# Patient Record
Sex: Female | Born: 1940
Health system: Southern US, Community
[De-identification: ages and names within clinical notes are randomized; demographics above are authoritative.]

## PROBLEM LIST (undated history)

## (undated) DIAGNOSIS — J189 Pneumonia, unspecified organism: Secondary | ICD-10-CM

## (undated) DIAGNOSIS — I499 Cardiac arrhythmia, unspecified: Secondary | ICD-10-CM

## (undated) DIAGNOSIS — I10 Essential (primary) hypertension: Secondary | ICD-10-CM

## (undated) DIAGNOSIS — M1990 Unspecified osteoarthritis, unspecified site: Secondary | ICD-10-CM

## (undated) DIAGNOSIS — E7800 Pure hypercholesterolemia, unspecified: Secondary | ICD-10-CM

## (undated) DIAGNOSIS — K219 Gastro-esophageal reflux disease without esophagitis: Secondary | ICD-10-CM

## (undated) DIAGNOSIS — C801 Malignant (primary) neoplasm, unspecified: Secondary | ICD-10-CM

## (undated) HISTORY — PX: CHOLECYSTECTOMY: SHX55

## (undated) HISTORY — PX: EYE SURGERY: SHX253

## (undated) HISTORY — PX: CARDIAC CATHETERIZATION: SHX172

## (undated) HISTORY — PX: TUBAL LIGATION: SHX77

## (undated) HISTORY — PX: DILATION AND CURETTAGE OF UTERUS: SHX78

---

## 2009-10-25 ENCOUNTER — Encounter: Admission: RE | Admit: 2009-10-25 | Discharge: 2009-10-25 | Payer: Self-pay | Admitting: Internal Medicine

## 2009-12-21 ENCOUNTER — Encounter (INDEPENDENT_AMBULATORY_CARE_PROVIDER_SITE_OTHER): Payer: Self-pay | Admitting: *Deleted

## 2010-01-26 ENCOUNTER — Encounter (INDEPENDENT_AMBULATORY_CARE_PROVIDER_SITE_OTHER): Payer: Self-pay | Admitting: *Deleted

## 2010-01-27 ENCOUNTER — Ambulatory Visit: Payer: Self-pay | Admitting: Internal Medicine

## 2010-02-07 ENCOUNTER — Ambulatory Visit: Payer: Self-pay | Admitting: Internal Medicine

## 2010-02-08 ENCOUNTER — Encounter: Payer: Self-pay | Admitting: Internal Medicine

## 2010-12-12 NOTE — Procedures (Signed)
Summary: Colonoscopy  Patient: Traci [name] Note: All result statuses are Final unless otherwise noted.  Tests: (1) Colonoscopy (COL)   COL Colonoscopy           DONE      [facility]     [address]     [place], [place]  [address]           COLONOSCOPY PROCEDURE REPORT           PATIENT:  Traci, [name]  MR#:  [id number]     BIRTHDATE:  1941/04/29, 68 yrs. old  GENDER:  female     ENDOSCOPIST:  Wilhemina Bonito. Eda Keys, MD     REF. BY:  Geoffry Paradise, M.D.     PROCEDURE DATE:  02/07/2010     PROCEDURE:  Colonoscopy with snare polypectomy x 5     ASA CLASS:  Class I     INDICATIONS:  Routine Risk Screening     MEDICATIONS:   Fentanyl 50 mcg IV, Versed 5 mg IV           DESCRIPTION OF PROCEDURE:   After the risks benefits and     alternatives of the procedure were thoroughly explained, informed     consent was obtained.  Digital rectal exam was performed and     revealed no abnormalities.   The LB CF-H180AL [id number] endoscope     was introduced through the anus and advanced to the cecum, which     was identified by both the appendix and ileocecal valve, without     limitations.Time to cecum = 1:51 min.  The quality of the prep was     excellent, using MoviPrep.  The instrument was then slowly     withdrawn (time = 17:01 min )as the colon was fully examined.     <<PROCEDUREIMAGES>>           FINDINGS:  Five polyps were found: 43mm,3mm,4mm in the ascending     colon and 26mm,5mm in the transverse colon. Polyps were snared     without cautery. Retrieval was successful.   Moderate     diverticulosis was found in the left colon.   Retroflexed views in     the rectum revealed internal hemorrhoids.    The scope was then     withdrawn from the patient and the procedure completed.           COMPLICATIONS:  None     ENDOSCOPIC IMPRESSION:     1) Five polyps - removed     2) Moderate diverticulosis in the left colon     3) Internal hemorrhoids           RECOMMENDATIONS:     1)  Follow up colonoscopy in 3 years           ______________________________     Wilhemina Bonito. Eda Keys, MD           CC:  Geoffry Paradise, MD;The Patient           n.     Rosalie DoctorWilhemina Bonito. Eda Keys at 02/07/2010 08:50 AM           Barnie Alderman, [id number]  Note: An exclamation mark (!) indicates a result that was not dispersed into the flowsheet. Document Creation Date: 02/07/2010 8:51 AM _______________________________________________________________________  (1) Order result status: Final Collection or observation date-time: 02/07/2010 08:44 Requested date-time:  Receipt date-time:  Reported date-time:  Referring Physician:   Ordering Physician:  Fransico Setters [phone]) Specimen Source:  Source: Launa Grill Order Number: [id number] Lab site:   Appended Document: Colonoscopy recall 3 yrs/01-2013     Procedures Next Due Date:    Colonoscopy: 02/2013

## 2010-12-12 NOTE — Letter (Signed)
Summary: [facility] Instructions  [facility]  [address] [place], [place] [address]   Phone: [phone]  Fax: [phone]       Traci [name]    [birth year]    MRN: [id number]        Procedure Day /Date:  Tuesday 02/07/2010     Arrival Time: 7:30 am      Procedure Time: 8:00 am     Location of Procedure:                    _ x_  [facility] (4th Floor)                        PREPARATION FOR COLONOSCOPY WITH MOVIPREP   Starting 5 days prior to your procedure Thursday 3/24 do not eat nuts, seeds, popcorn, corn, beans, peas,  salads, or any raw vegetables.  Do not take any fiber supplements (e.g. Metamucil, Citrucel, and Benefiber).  THE DAY BEFORE YOUR PROCEDURE         DATE: Monday 3/28  1.  Drink clear liquids the entire day-NO SOLID FOOD  2.  Do not drink anything colored red or purple.  Avoid juices with pulp.  No orange juice.  3.  Drink at least 64 oz. (8 glasses) of fluid/clear liquids during the day to prevent dehydration and help the prep work efficiently.  CLEAR LIQUIDS INCLUDE: Water Jello Ice Popsicles Tea (sugar ok, no milk/cream) Powdered fruit flavored drinks Coffee (sugar ok, no milk/cream) Gatorade Juice: apple, white grape, white cranberry  Lemonade Clear bullion, consomm, broth Carbonated beverages (any kind) Strained chicken noodle soup Hard Candy                             4.  In the morning, mix first dose of MoviPrep solution:    Empty 1 Pouch A and 1 Pouch B into the disposable container    Add lukewarm drinking water to the top line of the container. Mix to dissolve    Refrigerate (mixed solution should be used within 24 hrs)  5.  Begin drinking the prep at 5:00 p.m. The MoviPrep container is divided by 4 marks.   Every 15 minutes drink the solution down to the next mark (approximately 8 oz) until the full liter is complete.   6.  Follow completed prep with 16 oz of clear liquid of your choice (Nothing  red or purple).  Continue to drink clear liquids until bedtime.  7.  Before going to bed, mix second dose of MoviPrep solution:    Empty 1 Pouch A and 1 Pouch B into the disposable container    Add lukewarm drinking water to the top line of the container. Mix to dissolve    Refrigerate  THE DAY OF YOUR PROCEDURE      DATE: Tuesday 3/29  Beginning at 3:00 am (5 hours before procedure):         1. Every 15 minutes, drink the solution down to the next mark (approx 8 oz) until the full liter is complete.  2. Follow completed prep with 16 oz. of clear liquid of your choice.    3. You may drink clear liquids until 6:00 am  (2 HOURS BEFORE PROCEDURE).   MEDICATION INSTRUCTIONS  Unless otherwise instructed, you should take regular prescription medications with a small sip of water   as early as possible the morning  of your procedure.          OTHER INSTRUCTIONS  You will need a responsible adult at least 71 years of age to accompany you and drive you home.   This person must remain in the waiting room during your procedure.  Wear loose fitting clothing that is easily removed.  Leave jewelry and other valuables at home.  However, you may wish to bring a book to read or  an iPod/MP3 player to listen to music as you wait for your procedure to start.  Remove all body piercing jewelry and leave at home.  Total time from sign-in until discharge is approximately 2-3 hours.  You should go home directly after your procedure and rest.  You can resume normal activities the  day after your procedure.  The day of your procedure you should not:   Drive   Make legal decisions   Operate machinery   Drink alcohol   Return to work  You will receive specific instructions about eating, activities and medications before you leave.    The above instructions have been reviewed and explained to me by   Ezra Sites RN  January 27, 2010 10:15 AM    I fully understand and can verbalize  these instructions _____________________________ Date _________

## 2010-12-12 NOTE — Miscellaneous (Signed)
Summary: [facility]  Clinical Lists Changes  Medications: Added new medication of MOVIPREP 100 GM  SOLR (PEG-KCL-NACL-NASULF-NA ASC-C) As per prep instructions. - Signed Rx of MOVIPREP 100 GM  SOLR (PEG-KCL-NACL-NASULF-NA ASC-C) As per prep instructions.;  #1 x 0;  Signed;  Entered by: Ezra Sites RN;  Authorized by: Hilarie Fredrickson MD;  Method used: Electronically to [facility] [id number] [address] [place], [place], [place]  [id number], Ph: [phone], Fax: [phone] Observations: Added new observation of NKA: T (01/27/2010 9:53)    Prescriptions: MOVIPREP 100 GM  SOLR (PEG-KCL-NACL-NASULF-NA ASC-C) As per prep instructions.  #1 x 0   Entered by:   Ezra Sites RN   Authorized by:   Hilarie Fredrickson MD   Signed by:   Ezra Sites RN on 01/27/2010   Method used:   Electronically to        [facility] (retail)       [phone] [address]       [place], [place]  [id number]       Ph: [phone]       Fax: [phone]   RxID:   [id number]

## 2010-12-12 NOTE — Letter (Signed)
Summary: Previsit letter  [facility]  [address] [place], [place] [address]   Phone: [phone]  Fax: [phone]       12/21/2009 MRN: [id number]  Traci [name] [address] [place], [place]  [address]  Dear [name],  Welcome to the Gastroenterology Division at [facility].    You are scheduled to see a nurse for your pre-procedure visit on 01-27-10 at 10:00a.m. on the 3rd floor at [facility], [address]  We ask that you try to arrive at our office 15 minutes prior to your appointment time to allow for check-in.  Your nurse visit will consist of discussing your medical and surgical history, your immediate family medical history, and your medications.    Please bring a complete list of all your medications or, if you prefer, bring the medication bottles and we will list them.  We will need to be aware of both prescribed and over the counter drugs.  We will need to know exact dosage information as well.  If you are on blood thinners (Coumadin, Plavix, Aggrenox, Ticlid, etc.) please call our office today/prior to your appointment, as we need to consult with your physician about holding your medication.   Please be prepared to read and sign documents such as consent forms, a financial agreement, and acknowledgement forms.  If necessary, and with your consent, a friend or relative is welcome to sit-in on the nurse visit with you.  Please bring your insurance card so that we may make a copy of it.  If your insurance requires a referral to see a specialist, please bring your referral form from your primary care physician.  No co-pay is required for this nurse visit.     If you cannot keep your appointment, please call [phone] to cancel or reschedule prior to your appointment date.  This allows [place] the opportunity to schedule an appointment for another patient in need of care.    Thank you for choosing [facility] for your medical  needs.  We appreciate the opportunity to care for you.  Please visit [place] at our website  to learn more about our practice.                     Sincerely.                                                                                                                   The Gastroenterology Division

## 2010-12-12 NOTE — Letter (Signed)
Summary: Patient Notice- Polyp Results  [facility]  [address] [place], [place] [address]   Phone: [phone]  Fax: [phone]        February 08, 2010 MRN: [id number]    [facility] [place] [address] [place], [place]  [address]    Dear Ms. Eber,  I am pleased to inform you that the colon polyp(s) removed during your recent colonoscopy was (were) found to be benign (no cancer detected) upon pathologic examination.  I recommend you have a repeat colonoscopy examination in 3 years to look for recurrent polyps, as having colon polyps increases your risk for having recurrent polyps or even colon cancer in the future.  Should you develop new or worsening symptoms of abdominal pain, bowel habit changes or bleeding from the rectum or bowels, please schedule an evaluation with either your primary care physician or with me.  Additional information/recommendations:  __ No further action with gastroenterology is needed at this time. Please      follow-up with your primary care physician for your other healthcare      needs.    Please call us if you are having persistent problems or have questions about your condition that have not been fully answered at this time.  Sincerely,  Hilarie Fredrickson MD  This letter has been electronically signed by your physician.  Appended Document: Patient Notice- Polyp Results letter mailed 4.1.11

## 2011-02-09 ENCOUNTER — Inpatient Hospital Stay (HOSPITAL_COMMUNITY)
Admission: EM | Admit: 2011-02-09 | Discharge: 2011-02-16 | DRG: 419 | Disposition: A | Discharge status: Home / Self Care | Payer: Medicare Other | Source: Ambulatory Visit | Attending: Cardiology | Admitting: Cardiology

## 2011-02-09 ENCOUNTER — Emergency Department (HOSPITAL_COMMUNITY): Payer: Medicare Other

## 2011-02-09 DIAGNOSIS — I4891 Unspecified atrial fibrillation: Secondary | ICD-10-CM | POA: Diagnosis not present

## 2011-02-09 DIAGNOSIS — Z87891 Personal history of nicotine dependence: Secondary | ICD-10-CM

## 2011-02-09 DIAGNOSIS — R339 Retention of urine, unspecified: Secondary | ICD-10-CM | POA: Diagnosis not present

## 2011-02-09 DIAGNOSIS — K8000 Calculus of gallbladder with acute cholecystitis without obstruction: Principal | ICD-10-CM | POA: Diagnosis present

## 2011-02-09 DIAGNOSIS — Z7982 Long term (current) use of aspirin: Secondary | ICD-10-CM

## 2011-02-09 DIAGNOSIS — E785 Hyperlipidemia, unspecified: Secondary | ICD-10-CM | POA: Diagnosis present

## 2011-02-09 DIAGNOSIS — E876 Hypokalemia: Secondary | ICD-10-CM | POA: Diagnosis not present

## 2011-02-09 DIAGNOSIS — I10 Essential (primary) hypertension: Secondary | ICD-10-CM | POA: Diagnosis present

## 2011-02-09 DIAGNOSIS — K219 Gastro-esophageal reflux disease without esophagitis: Secondary | ICD-10-CM | POA: Diagnosis present

## 2011-02-09 DIAGNOSIS — I359 Nonrheumatic aortic valve disorder, unspecified: Secondary | ICD-10-CM | POA: Diagnosis present

## 2011-02-09 LAB — COMPREHENSIVE METABOLIC PANEL
ALT: 12 U/L (ref 0–35)
AST: 16 U/L (ref 0–37)
Alkaline Phosphatase: 54 U/L (ref 39–117)
CO2: 28 mEq/L (ref 19–32)
GFR calc non Af Amer: >60 mL/min (ref >60)
Glucose, Bld: 116 mg/dL — ABNORMAL HIGH (ref 70–99)
Potassium: 4.2 mEq/L (ref 3.5–5.1)
Sodium: 138 mEq/L (ref 135–145)
Total Protein: 7.3 g/dL (ref 6.0–8.3)

## 2011-02-09 LAB — DIFFERENTIAL
Lymphocytes Relative: 15 % (ref 12–46)
Lymphs Abs: 1.5 10*3/uL (ref 0.7–4.0)
Monocytes Absolute: 0.6 10*3/uL (ref 0.1–1.0)
Monocytes Relative: 6 % (ref 3–12)
Neutro Abs: 7.8 10*3/uL — ABNORMAL HIGH (ref 1.7–7.7)

## 2011-02-09 LAB — CBC
HCT: 36.9 % (ref 36.0–46.0)
Hemoglobin: 12.2 g/dL (ref 12.0–15.0)
MCH: 29.3 pg (ref 26.0–34.0)
MCHC: 33.1 g/dL (ref 30.0–36.0)
MCV: 88.5 fL (ref 78.0–100.0)

## 2011-02-10 ENCOUNTER — Emergency Department (HOSPITAL_COMMUNITY): Payer: Medicare Other

## 2011-02-10 ENCOUNTER — Observation Stay (HOSPITAL_COMMUNITY): Payer: Medicare Other

## 2011-02-10 LAB — CBC
HCT: 33.9 % — ABNORMAL LOW (ref 36.0–46.0)
Hemoglobin: 11.2 g/dL — ABNORMAL LOW (ref 12.0–15.0)
MCH: 29.6 pg (ref 26.0–34.0)
MCV: 89.4 fL (ref 78.0–100.0)
Platelets: 240 10*3/uL (ref 150–400)
RBC: 3.79 MIL/uL — ABNORMAL LOW (ref 3.87–5.11)
WBC: 7 10*3/uL (ref 4.0–10.5)

## 2011-02-10 LAB — LIPID PANEL
HDL: 73 mg/dL (ref >39)
Total CHOL/HDL Ratio: 2.1 RATIO
VLDL: 7 mg/dL (ref 0–40)

## 2011-02-10 LAB — CARDIAC PANEL(CRET KIN+CKTOT+MB+TROPI)
Relative Index: INVALID (ref 0.0–2.5)
Total CK: 51 U/L (ref 7–177)
Total CK: 44 U/L (ref 7–177)
Troponin I: 0.02 ng/mL (ref 0.00–0.06)

## 2011-02-10 LAB — POCT CARDIAC MARKERS
CKMB, poc: <1 ng/mL — ABNORMAL LOW (ref 1.0–8.0)
Myoglobin, poc: 48.5 ng/mL (ref 12–200)
Troponin i, poc: <0.05 ng/mL (ref 0.00–0.09)

## 2011-02-10 LAB — CK TOTAL AND CKMB (NOT AT ARMC): CK, MB: 0.5 ng/mL (ref 0.3–4.0)

## 2011-02-10 LAB — HEMOGLOBIN A1C: Mean Plasma Glucose: 114 mg/dL (ref <117)

## 2011-02-10 LAB — D-DIMER, QUANTITATIVE

## 2011-02-10 LAB — MAGNESIUM: Magnesium: 2.1 mg/dL (ref 1.5–2.5)

## 2011-02-10 LAB — TSH: TSH: 0.782 u[IU]/mL (ref 0.350–4.500)

## 2011-02-10 MED ORDER — IOHEXOL 300 MG/ML  SOLN
100.0000 mL | Freq: Once | INTRAMUSCULAR | Status: AC | PRN
  Administered 2011-02-10: 100 mL via INTRAVENOUS

## 2011-02-11 ENCOUNTER — Observation Stay (HOSPITAL_COMMUNITY): Payer: Medicare Other

## 2011-02-11 LAB — BASIC METABOLIC PANEL
BUN: 11 mg/dL (ref 6–23)
Calcium: 8.3 mg/dL — ABNORMAL LOW (ref 8.4–10.5)
Chloride: 104 mEq/L (ref 96–112)
Creatinine, Ser: 0.89 mg/dL (ref 0.4–1.2)
GFR calc Af Amer: >60 mL/min (ref >60)

## 2011-02-11 LAB — LIPID PANEL
Cholesterol: 135 mg/dL (ref 0–200)
Total CHOL/HDL Ratio: 2.3 RATIO

## 2011-02-11 LAB — COMPREHENSIVE METABOLIC PANEL
ALT: 10 U/L (ref 0–35)
AST: 21 U/L (ref 0–37)
Alkaline Phosphatase: 44 U/L (ref 39–117)
CO2: 27 mEq/L (ref 19–32)
Calcium: 8.4 mg/dL (ref 8.4–10.5)
GFR calc Af Amer: >60 mL/min (ref >60)
GFR calc non Af Amer: >60 mL/min (ref >60)
Potassium: 4.2 mEq/L (ref 3.5–5.1)
Sodium: 137 mEq/L (ref 135–145)
Total Protein: 6 g/dL (ref 6.0–8.3)

## 2011-02-11 LAB — CBC
MCH: 29.6 pg (ref 26.0–34.0)
MCHC: 32.7 g/dL (ref 30.0–36.0)
MCV: 90.6 fL (ref 78.0–100.0)
Platelets: 219 10*3/uL (ref 150–400)
RBC: 3.41 MIL/uL — ABNORMAL LOW (ref 3.87–5.11)
RDW: 13.9 % (ref 11.5–15.5)

## 2011-02-11 LAB — HEPARIN LEVEL (UNFRACTIONATED): Heparin Unfractionated: 1.04 IU/mL — ABNORMAL HIGH (ref 0.30–0.70)

## 2011-02-11 LAB — CK TOTAL AND CKMB (NOT AT ARMC): CK, MB: 0.2 ng/mL — ABNORMAL LOW (ref 0.3–4.0)

## 2011-02-11 LAB — LIPASE, BLOOD: Lipase: 25 U/L (ref 11–59)

## 2011-02-11 MED ORDER — IOHEXOL 300 MG/ML  SOLN
100.0000 mL | Freq: Once | INTRAMUSCULAR | Status: AC | PRN
  Administered 2011-02-11: 100 mL via INTRAVENOUS

## 2011-02-12 ENCOUNTER — Other Ambulatory Visit: Payer: Self-pay | Admitting: General Surgery

## 2011-02-12 LAB — COMPREHENSIVE METABOLIC PANEL
ALT: 14 U/L (ref 0–35)
AST: 18 U/L (ref 0–37)
Albumin: 3.4 g/dL — ABNORMAL LOW (ref 3.5–5.2)
Calcium: 8.3 mg/dL — ABNORMAL LOW (ref 8.4–10.5)
GFR calc Af Amer: >60 mL/min (ref >60)
Sodium: 133 mEq/L — ABNORMAL LOW (ref 135–145)
Total Protein: 6.7 g/dL (ref 6.0–8.3)

## 2011-02-12 LAB — CBC
Platelets: 271 10*3/uL (ref 150–400)
RDW: 13.4 % (ref 11.5–15.5)
WBC: 14.8 10*3/uL — ABNORMAL HIGH (ref 4.0–10.5)

## 2011-02-13 ENCOUNTER — Inpatient Hospital Stay (HOSPITAL_COMMUNITY): Payer: Medicare Other

## 2011-02-13 LAB — CBC
Hemoglobin: 8.8 g/dL — ABNORMAL LOW (ref 12.0–15.0)
MCHC: 32.2 g/dL (ref 30.0–36.0)
MCV: 90.9 fL (ref 78.0–100.0)
Platelets: 180 10*3/uL (ref 150–400)
RDW: 13.6 % (ref 11.5–15.5)
WBC: 10.3 10*3/uL (ref 4.0–10.5)

## 2011-02-13 LAB — URINE CULTURE
Colony Count: 30000
Culture  Setup Time: 201204012117

## 2011-02-13 NOTE — Op Note (Signed)
Traci [name], ROBBS                ACCOUNT NO.:  [id number]  MEDICAL RECORD NO.:  [id number]           PATIENT TYPE:  I  LOCATION:  5529                         FACILITY:  [facility]  PHYSICIAN:  Juanetta Gosling, MDDATE OF BIRTH:  1941/10/12  DATE OF PROCEDURE:  02/12/2011 DATE OF DISCHARGE:                              OPERATIVE REPORT   PREOPERATIVE DIAGNOSIS:  Acute cholecystitis.  POSTOPERATIVE DIAGNOSIS:  Acute cholecystitis.  PROCEDURE:  Laparoscopic cholecystectomy.  SURGEON:  Troy Sine. Dwain Sarna, MD  ASSISTANT:  None.  ANESTHESIA:  General.  DRAINS:  A 19-French Blake drain to gallbladder fossa.  SPECIMEN:  Gallbladder and contents to pathology.  ESTIMATED BLOOD LOSS:  50 mL.  COMPLICATIONS:  None.  DISPOSITION:  To recovery room in stable condition.  INDICATIONS:  This is a 70 year old female with hypertension, hyperlipidemia who was had several episodes of indigestion and epigastric right upper quadrant pain over the past several months.  She has undergone a cardiac evaluation.  This has been negative.  She underwent evaluation with ultrasound as well as a CT scan which showed what appeared to be cholecystitis.  On my exam, she had a Murphy sign and clearly had cholecystitis that went along with a radiologic studies. She and I discussed the laparoscopic cholecystectomy.  I waited until her heparin had worn off that she was off on until the morning when I saw her and then we took her to the operating room.  PROCEDURE:  After informed consent was obtained, the patient was taken to the operating room.  Sequential compression devices were placed on lower extremities prior to induction of anesthesia.  She was then placed on general anesthesia without complication.  Her abdomen was prepped and draped in standard sterile surgical fashion.  Surgical time-out was then performed.  A 0.25% Marcaine was infiltrated below her umbilicus.  I then made a vertical  incision.  This was carried out down to her fascia.  Her fascia was entered sharply.  Her peritoneum was entered bluntly.  There was some murky fluid present in her abdomen upon entering.  I then placed 3 further 5-mm trocars in the right upper quadrant and the epigastrium. Dr. Janee Morn did two in the right upper quadrant.  Her gallbladder was noted then to have acute cholecystitis where a couple portions were gangrenous.  The omentum was adherent to the gallbladder.  This was removed bluntly.  I then aspirated the gallbladder.  We were unable to grab it at this point.  We are able to be retract it cephalad and lateral with some difficulty.  Eventually, I was able to obtain the critical view of safety and clearly saw both the cystic duct and cystic artery.  I clipped these structures.  I clipped both the anterior and posterior branch of the cystic artery separately and then divided them. The gallbladder was then removed from the liver bed with some difficulty, although there was no spillage.  This was then placed in EndoCatch bag and removed from the umbilicus.  This was difficult as she had enlarged umbilical incision to get this out.  I then irrigated copiously.  After about 10 minutes, we obtained hemostasis.  I then placed a piece of Surgicel snow into the gallbladder bed.  I then placed a 19-French Blake drain as well.  This was secured with the 2-0 nylon. I then evacuated all of the fluid and removed my Hasson trocar.  I did place an additional 0 Vicryl stitch in the umbilical incision.  I had viewed the umbilical incision as well and I have made sure that there was not any entry injury.  I then close all the incision with a 4-0 Monocryl and Dermabond was placed over these.  She tolerated this well, was extubated in the operating room and transferred to recovery room in stable condition.     Juanetta Gosling, MD     MCW/MEDQ  D:  02/12/2011  T:  02/13/2011  Job:   [id number]  Electronically Signed by Emelia Loron MD on 02/13/2011 04:01:12 PM

## 2011-02-14 ENCOUNTER — Inpatient Hospital Stay (HOSPITAL_COMMUNITY): Payer: Medicare Other

## 2011-02-14 LAB — HEPATIC FUNCTION PANEL
Bilirubin, Direct: 0.2 mg/dL (ref 0.0–0.3)
Indirect Bilirubin: 0.3 mg/dL (ref 0.3–0.9)
Total Protein: 5 g/dL — ABNORMAL LOW (ref 6.0–8.3)

## 2011-02-14 LAB — PROCALCITONIN: Procalcitonin: 2.23 ng/mL

## 2011-02-14 LAB — BASIC METABOLIC PANEL
CO2: 27 mEq/L (ref 19–32)
CO2: 24 mEq/L (ref 19–32)
Calcium: 7.5 mg/dL — ABNORMAL LOW (ref 8.4–10.5)
Calcium: 7.4 mg/dL — ABNORMAL LOW (ref 8.4–10.5)
Chloride: 104 mEq/L (ref 96–112)
Creatinine, Ser: 0.94 mg/dL (ref 0.4–1.2)
GFR calc Af Amer: >60 mL/min (ref >60)
GFR calc Af Amer: >60 mL/min (ref >60)
GFR calc non Af Amer: 59 mL/min — ABNORMAL LOW (ref >60)
Glucose, Bld: 114 mg/dL — ABNORMAL HIGH (ref 70–99)
Potassium: 3 mEq/L — ABNORMAL LOW (ref 3.5–5.1)
Sodium: 136 mEq/L (ref 135–145)

## 2011-02-14 LAB — URINALYSIS, ROUTINE W REFLEX MICROSCOPIC
Glucose, UA: NEGATIVE mg/dL
Leukocytes, UA: NEGATIVE
Nitrite: NEGATIVE
Protein, ur: 100 mg/dL — AB
Urobilinogen, UA: 1 mg/dL (ref 0.0–1.0)

## 2011-02-14 LAB — URINE MICROSCOPIC-ADD ON

## 2011-02-14 LAB — MAGNESIUM
Magnesium: 1.9 mg/dL (ref 1.5–2.5)
Magnesium: 1.9 mg/dL (ref 1.5–2.5)

## 2011-02-14 LAB — CBC
Hemoglobin: 9.6 g/dL — ABNORMAL LOW (ref 12.0–15.0)
MCH: 29.1 pg (ref 26.0–34.0)
MCHC: 31.6 g/dL (ref 30.0–36.0)
RDW: 13.7 % (ref 11.5–15.5)

## 2011-02-14 LAB — CARDIAC PANEL(CRET KIN+CKTOT+MB+TROPI)
CK, MB: 1.2 ng/mL (ref 0.3–4.0)
Relative Index: 0.3 (ref 0.0–2.5)
Total CK: 359 U/L — ABNORMAL HIGH (ref 7–177)

## 2011-02-14 LAB — CK TOTAL AND CKMB (NOT AT ARMC)
CK, MB: 1.8 ng/mL (ref 0.3–4.0)
CK, MB: 1.4 ng/mL (ref 0.3–4.0)
Relative Index: 0.5 (ref 0.0–2.5)

## 2011-02-14 LAB — PHOSPHORUS
Phosphorus: 1.7 mg/dL — ABNORMAL LOW (ref 2.3–4.6)
Phosphorus: 3 mg/dL (ref 2.3–4.6)

## 2011-02-14 LAB — TROPONIN I: Troponin I: 0.08 ng/mL — ABNORMAL HIGH (ref 0.00–0.06)

## 2011-02-15 LAB — BASIC METABOLIC PANEL
BUN: 6 mg/dL (ref 6–23)
CO2: 26 mEq/L (ref 19–32)
Calcium: 7.6 mg/dL — ABNORMAL LOW (ref 8.4–10.5)
Chloride: 107 mEq/L (ref 96–112)
Creatinine, Ser: 0.77 mg/dL (ref 0.4–1.2)
GFR calc Af Amer: >60 mL/min (ref >60)

## 2011-02-15 LAB — CBC
Hemoglobin: 8.6 g/dL — ABNORMAL LOW (ref 12.0–15.0)
RBC: 2.93 MIL/uL — ABNORMAL LOW (ref 3.87–5.11)
WBC: 6.2 10*3/uL (ref 4.0–10.5)

## 2011-02-15 LAB — TROPONIN I: Troponin I: 0.05 ng/mL (ref 0.00–0.06)

## 2011-02-15 LAB — URINE CULTURE

## 2011-02-15 NOTE — H&P (Signed)
Traci [name], Traci [name]                ACCOUNT NO.:  [id number]  MEDICAL RECORD NO.:  [id number]          PATIENT TYPE:  INP  LOCATION:                               FACILITY:  [facility]  PHYSICIAN:  Landry Corporal, MD DATE OF BIRTH:  [birth year]  DATE OF ADMISSION:  02/10/2011 DATE OF DISCHARGE:                             HISTORY & PHYSICAL   CHIEF COMPLAINT:  Chest pain.  HISTORY OF PRESENT ILLNESS:  A 70 year old white female with cardiac workup on November 20, 2010.  At that time, she had had 2 episodes of unexplained chest pain at rest and she had had one prior to that episode.  When she has been awakened from sleep, with severe retrosternal pain persisted for 4 hours, partially relieved by sitting up, and vomiting x3, with severity of symptoms 6/10 on a 1-10 scale. She had also complained of prominent fatigue and she would have to stop and rest during her activity.  On his exam, he recommended a nuclear stress test, which she underwent and she was told that it was within normal limits.  He did note she had an aortic outflow murmur, which she felt was fairly unremarkable and an echocardiogram was ordered as well.  Unfortunately, I do not have access to either of those tonight.  He also recommended if both were negative, then upper endoscopy may be warranted.  Since that visit, the patient has had 3 more episodes of chest pain after meals.  Today's episode occurred 3 hours after a meal.  She had been sitting up.  She felt fine and then suddenly developed chest pressure, tightness, midsternal with radiation to her back and she did get clammy and vomited twice, but the pain continued despite vomiting. She came to the emergency room with pain.  The pain did last for about 5 hours.  EKG, I do not have an old one to compare of that sinus rhythm, first-degree AV block, PR interval was 240 msec, no acute changes.  Here in the ER, her D-dimer was elevated and she underwent CT angio  with no pulmonary embolus noted.  We were asked to see in consult with a ER physician.  PAST MEDICAL HISTORY:  Positive for hypertension and hyperlipidemia.  No major surgeries.  ALLERGIES:  No known allergies.  OUTPATIENT MEDICATIONS: 1. Simvastatin 40 mg daily. 2. Lisinopril/HCTZ 20/12.5 daily. 3. Vitamin D weekly. 4. Aspirin 325 that she rarely rarely takes.  SOCIAL HISTORY:  She has been widowed for over a year, retired Haematologist at [employer].  She has 3 children.  No grandchildren.  Her family is with her tonight.  She smokes very briefly and stopped over 4 years ago.  Occasional glass of wine and never used recreational drugs.  FAMILY HISTORY:  Her father had diabetes and died of heart disease at 31.  Mother lived to be 14 and had Alzheimer's.  She has a brother and sister that have diabetes as well.  Her children are in good health. She has 3 sons.  PHYSICAL EXAMINATION:  VITAL SIGNS:  Blood pressure 146/70 on arrival, now 120/61; pulse 93; respiratory rate 20;  temperature 98.4.  A rectal temperature was done, which was 98.8.  She had also complained of fever and chills.  Oxygen saturation 92% and then 96%. GENERAL:  Alert, oriented, white female, pleasant affect.  Alert and oriented x3.  Follows commands.  No acute distress. SKIN:  Warm and dry.  Brisk capillary refill. HEENT:  Normocephalic.  Sclerae clear. NECK:  Supple.  No JVD.  No bruits.  No thyromegaly. LUNGS:  Clear without rales, rhonchi, or wheezes. HEART:  Sounds S1-S2.  Regular rate and rhythm with a 3/6 aortic outflow murmur. ABDOMEN:  Soft, mild discomfort in the right upper quadrant, more so when she releases her breast under her right rib cage.  No palpable masses. LOWER EXTREMITIES:  Without edema, 2+ pedal pulses bilaterally, 2+ radials bilaterally. NEURO:  Alert and oriented x3.  Follows commands.  ASSESSMENT: 1. Recurrent chest pain associated with nausea and vomiting and     clamminess,  this is while sitting up and 3 hours after meals with     recent history of negative stress test per the patient. 2. Aortic outflow murmur. 3. Hypertension, controlled. 4. Hyperlipidemia with lipid panel in November 2011, cholesterol 206,     triglycerides 98, HDL 75, and LDL of 111.  PLAN:  Admit observation.  We will do serial CK-MBs.  We will give injection of Lovenox tonight 1 mg/kg andcontinue current outpatient meds.  We will get a gallbladder ultrasound later this morning to rule out  gallbladder disease as the cause of intermittent pain with nausea  and vomiting as long as cardiac enzymes were negative.   Cardiologist will also see later this morning to determine if cardiac cath should be pursued as well.  Please note other labs, lipase was 48, myoglobin 49.9, CK-MB less than 1.0, troponin-I less than 0.05, D-dimer as stated was 0.68 with negative CT of the chest.  Hemoglobin 12.2, hematocrit 36.9, WBC 9.9, platelets 252.  Sodium 138, potassium 4.2, chloride 101, CO2 28, glucose 116, BUN 20, creatinine 0.86, calcium 9.5, total protein 7.3, albumin 3.9, SGOT 16, SGPT 12.     [name]. [name], N.P.   ______________________________ Landry Corporal, MD    LRI/MEDQ  D:  02/10/2011  T:  02/10/2011  Job:  [id number]  cc:   Thurmon Fair, MD Geoffry Paradise, MD  Electronically Signed by Nada Boozer N.P. on 02/14/2011 02:56:19 PM Electronically Signed by Bryan Lemma MD on 02/15/2011 07:10:08 AM MedRecNo: [id number] [facility], Account: [id number], DocSeq: [id number] The patient was admitted while I as on call.  However, I did not personally see he until several days later.  She was initially evaluated by Dr. Tresa Endo in the morning.   After reviewing the report, I agree with Ms. Ingold's note. Electronically Signed by Bryan Lemma MD on 02/15/2011 07:09:58 AM

## 2011-02-16 LAB — CBC
Hemoglobin: 9 g/dL — ABNORMAL LOW (ref 12.0–15.0)
MCH: 30 pg (ref 26.0–34.0)
MCHC: 33.1 g/dL (ref 30.0–36.0)
Platelets: 238 10*3/uL (ref 150–400)
RDW: 13.7 % (ref 11.5–15.5)

## 2011-02-16 LAB — COMPREHENSIVE METABOLIC PANEL
ALT: 23 U/L (ref 0–35)
AST: 16 U/L (ref 0–37)
CO2: 26 mEq/L (ref 19–32)
Calcium: 7.8 mg/dL — ABNORMAL LOW (ref 8.4–10.5)
Creatinine, Ser: 0.79 mg/dL (ref 0.4–1.2)
GFR calc Af Amer: >60 mL/min (ref >60)
GFR calc non Af Amer: >60 mL/min (ref >60)
Sodium: 141 mEq/L (ref 135–145)
Total Protein: 4.8 g/dL — ABNORMAL LOW (ref 6.0–8.3)

## 2011-02-20 LAB — CULTURE, BLOOD (ROUTINE X 2)
Culture  Setup Time: 201204040416
Culture: NO GROWTH

## 2011-02-23 NOTE — Discharge Summary (Signed)
Traci [name], Traci [name]                ACCOUNT NO.:  [id number]  MEDICAL RECORD NO.:  [id number]           PATIENT TYPE:  I  LOCATION:  2922                         FACILITY:  [facility]  PHYSICIAN:  Traci Corporal, MD DATE OF BIRTH:  06/23/1941  DATE OF ADMISSION:  02/09/2011 DATE OF DISCHARGE:  02/16/2011                              DISCHARGE SUMMARY   DISCHARGE DIAGNOSES: 1. Chest pain.     a.     Negative myocardial infarction.     b.     Acute cholecystitis, undergoing laparoscopic cholecystectomy      February 12, 2011, by Dr. Emelia [name]. 2. Atrial fibrillation with rapid ventricular response day 2     postoperatively resolved maintaining sinus rhythm at discharge. 3. Hypokalemia, replaced. 4. Hypertension controlled.  Previously, negative nuclear stress test     2-3 months prior to this admission.  DISCHARGE CONDITION:  Improved.  PROCEDURES:  Lap chole February 16, 2011, by Dr. Emelia [name].  DISCHARGE MEDICATIONS:  See medication reconciliation sheet from [facility]. Beta blocker has been added to her medications.  DISCHARGE INSTRUCTIONS: 1. No lifting for 2 weeks and return to work in 2-6 weeks.  Low-sodium     heart-healthy diet.  Wound care per surgical instructions. 2. Follow up with Dr. Dwain [name] in 3-4 weeks.  Call for appointment. 3. Follow up with Dr. Royann [name] in the office who will call with date     and time. 4. Follow up with Dr. Jacky [name] in 2-3 weeks. 5. Have blood work done on Monday and this was to evaluate potassium.  HISTORY OF PRESENT ILLNESS:  A 70 year old white female was admitted to the emergency room February 10, 2011, with complaints of chest pain.  She had this previously, had been worked up in Dr. [employer] office and had negative nuclear stress test and a stable 2-D echo.  On the day of admission, her pain occurred 3 hours after a meal.  She felt fine and suddenly developed chest pressure tightness midsternal with radiation to her back.  She  became clammy and vomited twice.  The pain continued despite vomited when previously.  The vomiting seemed to help.  She came into the emergency room with pain that had lasted approximately 5 hours. EKG, first-degree AV block with no acute changes and D-dimer was elevated.  CT angio with no pulmonary emboli.  She did have some abdominal discomfort on exam.  We ordered an abdominal ultrasound for the next morning.  We did admit her to rule out MI.  She was placed on subcu Lovenox mg per kilo and plans were dependent upon her ultrasound.  The ultrasound was done revealing cholelithiasis without evidence of acute cholecystitis.  She did have hepatic steatosis and liver cyst. Later that same day, she developed severe pain again, not relieved with nitro, not relieved with morphine, this is severe pain.  CT exam was ordered which did reveal edematous thick wall gallbladder with increased attenuation suggesting acute cholecystitis, presumed mesenteric inflammatory changes, uterine fibroids.  Unsure why there was such a difference between the ultrasound and the CT, but surgical consult was obtained; and  on February 12, 2011, the patient underwent procedure lap chole.  The next morning, she was stable.  Blood pressure was low.  Her Lopressor which she had been placed because of possible coronary syndrome was discontinued.  Her ACE inhibitor was held.  She also had urinary retention postoperatively.  Please note, her surgery date was February 12, 2011, and on February 13, 2011, we were called back to see her.  She had developed AFib with rapid ventricular response with associated hypotension of 88.  She was given fluid bolus.  There was concern perhaps it could be due to sepsis.  She was given IV fluids, started on IV Cardizem and transferred to the ICU Unit.  She was also placed on empiric antibiotics.  She converted within 24 hours to sinus rhythm.  Her IV drip was discontinued.  Metoprolol  was restarted; and by February 16, 2011, the patient was stable, was ambulating. She had been afebrile.  White count was stable.  It was felt this was not due to sepsis, but her hypotension was related to their rapid ventricular response of her atrial fibrillation.  Therefore, antibiotics were discontinued at discharge and she was discharged home.  LABORATORY DATA AT DISCHARGE:  Sodium 141, potassium 3.3, and this was replaced, BUN 5, creatinine 0.79 and glucose 102, albumin 2.1. Hemoglobin was 9, hematocrit 27.2, platelets 238, and WBC 6.2.  Cardiac enzymes have all been negative.  Original chest x-ray developing infiltration or atelectasis in the left lung base, possible small left pleural effusion, but on two-view no acute cardiopulmonary findings. Lungs were clear.  PICC line had been inserted secondary to poor IV access and that was done on the 3rd.  EKGs have been sinus rhythm, first-degree AV block and no acute changes and these were also done with acute pain episodes.  Again, no acute changes.  She did get tachycardic and she had atrial fibrillation with rapid ventricular response with a heart rate of 162.     Traci [name]. Traci [name], N.P.   ______________________________ Traci Corporal, MD    LRI/MEDQ  D:  02/19/2011  T:  02/20/2011  Job:  [id number]  cc:   Traci [name], M.D. [facility] Surgery  Electronically Signed by Traci [name] N.P. on 02/20/2011 05:15:29 PM Electronically Signed by Traci Lemma MD on 02/23/2011 04:45:46 PM MedRecNo: [id number] [facility], Account: [id number], DocSeq: [id number] The patient was seen by one of my partners on the day of discharge, and was felt to be stable for discharge. I otherwise agree with Traci [name]'s note. Electronically Signed by Traci Lemma MD on 02/23/2011 04:45:20 PM

## 2011-02-26 NOTE — Consult Note (Signed)
NAMESILAS, [name] NO.:  [id number]  MEDICAL RECORD NO.:  [id number]          PATIENT TYPE:  LOCATION:                                 FACILITY:  PHYSICIAN:  Juanetta Gosling, MDDATE OF BIRTH:  12/05/1940  DATE OF CONSULTATION:  02/12/2011 DATE OF DISCHARGE:                                CONSULTATION   REQUESTING PHYSICIAN:  Landry Corporal, MD.  PRIMARY CARE PHYSICIAN:  Geoffry Paradise, MD  REASON FOR CONSULTATION:  Acute cholecystitis.  HISTORY OF PRESENT ILLNESS:  The patient is a very pleasant 70 year old white female with a history of hypertension and hyperlipidemia who developed several episodes of "indigestion" and chest pain several months ago.  She has had these intermittently.  She saw her primary care physician who sent her to [facility] for further workup.  At that time, she had an echocardiogram as well as a stress test completed.  These were both normal.  Each episode the patient had, she did have associated nausea and vomiting after eating meals.  Approximately 3 hours after eating a meal this past Friday, the patient developed similar symptoms.  At this time, her pain was worse than it had been in the past and she came to the emergency department.  She was admitted by the cardiologist here to rule out a cardiac cause.  Cardiac enzymes have all been negative.  The patient was started on a heparin drip.  There is a plan for cardiac catheterization today; however, she was found to have elevated white blood cell count at 14,000 as well as an ultrasound showing cholelithiasis and a CT scan showing gallbladder wall thickening with pericholecystic fluid.  The patient is complaining of worsening right upper quadrant pain that radiates somewhat to her back.  She continues to have nausea and vomiting while in the hospital. Given the above findings, her cardiac catheterization was cancelled and we were asked  to evaluate the patient for possible cholecystectomy.  REVIEW OF SYSTEMS:  Please see HPI.  Otherwise, all other systems have been reviewed and are negative.  FAMILY HISTORY:  Noncontributory.  PAST MEDICAL HISTORY: 1. Hypertension. 2. Hyperlipidemia.  PAST SURGICAL HISTORY: 1. Several basal cell carcinomas removed. 2. Tubal ligation.  SOCIAL HISTORY:  The patient is widowed.  Her two sons and her sister are present in the room with her.  She denies any tobacco but admits to occasional alcohol use.  She denies any illicit drug abuse.  ALLERGIES:  NKDA.  Medications at home include: 1. Vitamin D2 50,000 units weekly. 2. Lisinopril/hydrochlorothiazide 20/12.5 mg daily. 3. Simvastatin 40 mg daily.  PHYSICAL EXAMINATION:  GENERAL:  Traci [name] is a very pleasant 70 year old white female who is well developed and well nourished and lying in bed in no acute distress. VITAL SIGNS:  Temperature 99.3, pulse 89, blood pressure 105/54, respirations 27. HEENT:  Head is normocephalic, atraumatic.  Sclerae noninjected.  Pupils are equal, round, and reactive to light.  Ears and nose without any obvious masses or lesions.  No rhinorrhea.  Mouth is pink.  Throat shows no exudate. HEART:  Regular  rate and rhythm with normal S1 and S2.  No gallops or rubs are noted; however, she does have a slight murmur noted.  The patient does have palpable carotid, radial, and pedal pulses bilaterally. LUNGS:  Clear to auscultation bilaterally with no wheezes, rhonchi, or rales noted.  Respiratory effort is nonlabored. ABDOMEN:  Soft.  She does appear to be tender in the right upper quadrant; however, this is difficult to examine as she is voluntarily guarding with palpation.  Therefore, this is difficult to assess for a Murphy sign.  She does have active bowel sounds and is otherwise nondistended.  No masses, hernias, or organomegaly are noted.  The patient does not have any peritoneal  signs. MUSCULOSKELETAL:  All four extremities are symmetrical with no cyanosis, clubbing, or edema. NEUROLOGIC:  Cranial nerves II through XII appear to be grossly intact. Deep tendon reflex exam is deferred at this time. PSYCHIATRIC:  The patient is alert and oriented x3 with an appropriate affect.  LABORATORY DATA:  Lipase is 22.  Sodium 33, potassium 3.6, glucose 116, BUN 9, creatinine 0.79, total bilirubin is 0.8, alkaline phosphatase is 52, AST is 18, ALT is 14, amylase is 71.  White blood cell count is 14,800, hemoglobin 4.4, hematocrit 38.4, platelet count is 271,000.  The patient's white blood cell count has been elevating over the last day, as yesterday her white blood cell count was 6700 and has increased to 14,800.  DIAGNOSTICS:  CT scan of the abdomen and pelvis reveals an edematous thick-walled gallbladder with increased attenuation suggesting acute cholecystitis.  It does note uterine fibroids.  Ultrasound of the abdomen reveals cholelithiasis without evidence of acute cholecystitis. There is a hepatic steatosis and a liver cyst.  CT angiogram of chest reveals no pulmonary embolism.  There are mild atherosclerotic changes involving the aorta but no dissection is seen.  Chest x-ray reveals no acute cardiopulmonary findings.  IMPRESSION: 1. Acute cholecystitis with cholelithiasis. 2. Hypertension. 3. Hyperlipidemia.  PLAN:  At this time, we will keep the patient n.p.o.  Her heparin drip has already been stopped at 7:15 a.m.  The earliest we can proceed with surgical intervention is 6 hours later which will be 1315 this afternoon.  The patient is already on Zosyn and we agree with this.  I will discuss this patient with Dr. Dwain Sarna and hopefully plan for laparoscopic cholecystectomy later today if the surgery schedule allows. The patient and her sister as well as her sons have been explained the procedure along with risks and complications.  They understand these  and wish to proceed.     Traci Cape, PA   ______________________________ Juanetta Gosling, MD    KEO/MEDQ  D:  02/12/2011  T:  02/13/2011  Job:  [id number]  cc:   Landry Corporal, MD Geoffry Paradise, MD [facility] Surgery.  Electronically Signed by Barnetta Chapel PA on 02/23/2011 01:06:38 PM Electronically Signed by Emelia Loron MD on 02/25/2011 08:23:53 PM

## 2011-03-13 ENCOUNTER — Encounter (INDEPENDENT_AMBULATORY_CARE_PROVIDER_SITE_OTHER): Payer: Self-pay | Admitting: General Surgery

## 2012-01-12 IMAGING — CR DG CHEST 1V PORT
1 series · 1 of 1 positions shown · non-contrast
Comparison: 02/13/2011

CLINICAL DATA: PICC line placement

PORTABLE CHEST - 1 VIEW

[view not recorded]
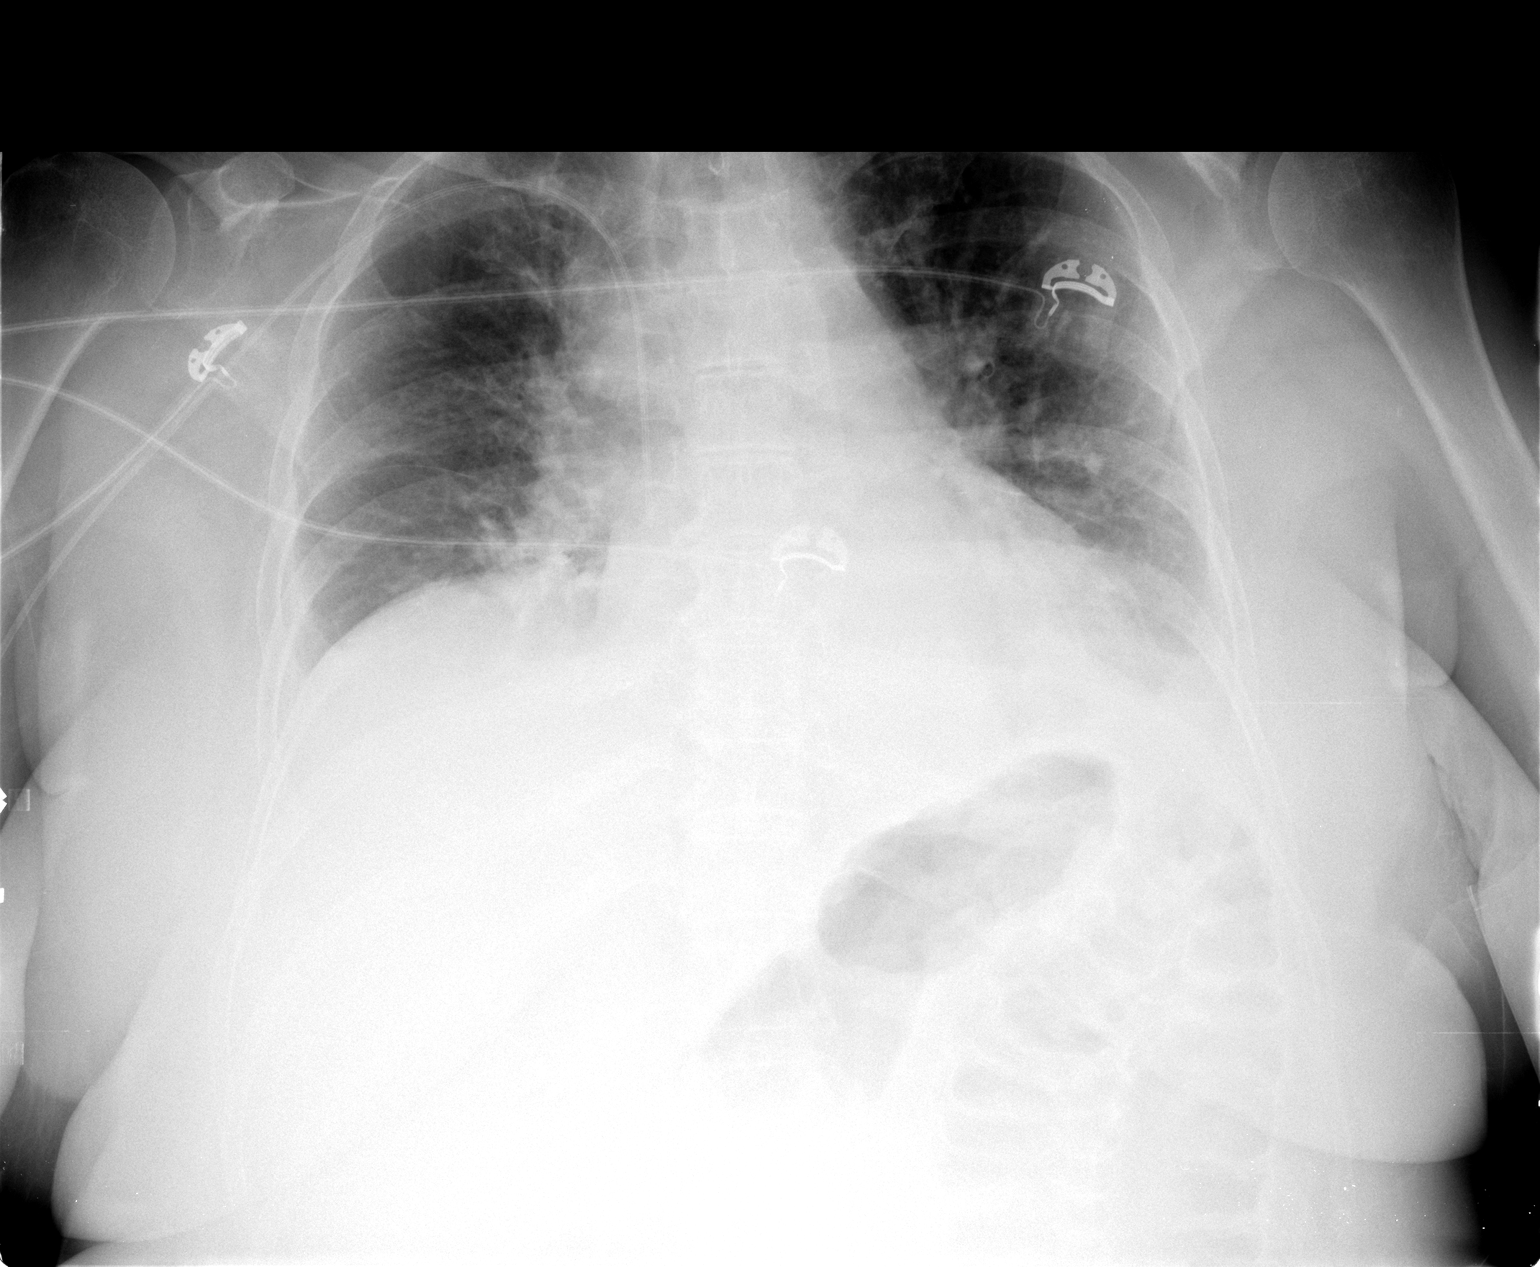

[1 of 1 positions shown; findings below may reference images not displayed]

FINDINGS: 6589 hours.  Right PICC line tip projects at the distal
SVC level, near the junction with the RA. The cardiopericardial
silhouette is enlarged.  Bibasilar atelectasis or infiltrate, left
greater than right, persist.  Tiny left pleural effusion noted.
Telemetry leads overlie the chest.
IMPRESSION: Right PICC line tip projects at the distal SVC level.

## 2012-03-20 ENCOUNTER — Emergency Department (HOSPITAL_BASED_OUTPATIENT_CLINIC_OR_DEPARTMENT_OTHER)
Admission: EM | Admit: 2012-03-20 | Discharge: 2012-03-20 | Disposition: A | Discharge status: Home / Self Care | Payer: Medicare Other | Attending: Emergency Medicine | Admitting: Emergency Medicine

## 2012-03-20 ENCOUNTER — Emergency Department (INDEPENDENT_AMBULATORY_CARE_PROVIDER_SITE_OTHER): Payer: Medicare Other

## 2012-03-20 ENCOUNTER — Encounter (HOSPITAL_BASED_OUTPATIENT_CLINIC_OR_DEPARTMENT_OTHER): Payer: Self-pay | Admitting: *Deleted

## 2012-03-20 DIAGNOSIS — E7800 Pure hypercholesterolemia, unspecified: Secondary | ICD-10-CM | POA: Insufficient documentation

## 2012-03-20 DIAGNOSIS — M949 Disorder of cartilage, unspecified: Secondary | ICD-10-CM

## 2012-03-20 DIAGNOSIS — M549 Dorsalgia, unspecified: Secondary | ICD-10-CM | POA: Insufficient documentation

## 2012-03-20 DIAGNOSIS — M545 Low back pain: Secondary | ICD-10-CM

## 2012-03-20 DIAGNOSIS — I10 Essential (primary) hypertension: Secondary | ICD-10-CM | POA: Insufficient documentation

## 2012-03-20 DIAGNOSIS — K219 Gastro-esophageal reflux disease without esophagitis: Secondary | ICD-10-CM | POA: Insufficient documentation

## 2012-03-20 DIAGNOSIS — Z79899 Other long term (current) drug therapy: Secondary | ICD-10-CM | POA: Insufficient documentation

## 2012-03-20 HISTORY — DX: Pure hypercholesterolemia, unspecified: E78.00

## 2012-03-20 HISTORY — DX: Cardiac arrhythmia, unspecified: I49.9

## 2012-03-20 HISTORY — DX: Essential (primary) hypertension: I10

## 2012-03-20 HISTORY — DX: Gastro-esophageal reflux disease without esophagitis: K21.9

## 2012-03-20 MED ORDER — DIAZEPAM 5 MG PO TABS: 5.0000 mg | ORAL_TABLET | Freq: Two times a day (BID) | ORAL | Status: AC

## 2012-03-20 MED ORDER — OXYCODONE-ACETAMINOPHEN 5-325 MG PO TABS: 2.0000 | ORAL_TABLET | ORAL | Status: AC | PRN

## 2012-03-20 MED ORDER — DIAZEPAM 5 MG PO TABS
5.0000 mg | ORAL_TABLET | Freq: Once | ORAL | Status: AC
  Administered 2012-03-20: 5 mg via ORAL
  Filled 2012-03-20: qty 1

## 2012-03-20 MED ORDER — OXYCODONE-ACETAMINOPHEN 5-325 MG PO TABS
1.0000 | ORAL_TABLET | Freq: Once | ORAL | Status: AC
  Administered 2012-03-20: 1 via ORAL
  Filled 2012-03-20: qty 1

## 2012-03-20 MED ORDER — PREDNISONE 10 MG PO TABS: 20.0000 mg | ORAL_TABLET | Freq: Every day | ORAL | Status: DC

## 2012-03-20 MED ORDER — PREDNISONE 50 MG PO TABS
60.0000 mg | ORAL_TABLET | Freq: Once | ORAL | Status: AC
  Administered 2012-03-20: 60 mg via ORAL
  Filled 2012-03-20: qty 1

## 2012-03-20 NOTE — ED Notes (Signed)
Lower back pain for several weeks. No known injury. This am pain was worse and she was unable to get out of bed immediatly. Took a Flexeril, Ibuprofen and a left over unknown pain medication with some relief.

## 2012-03-20 NOTE — ED Notes (Signed)
Pt.  Has gone to Radiology

## 2012-03-20 NOTE — Discharge Instructions (Signed)
Back Pain, Adult Low back pain is very common. About 1 in 5 people have back pain.The cause of low back pain is rarely dangerous. The pain often gets better over time.About half of people with a sudden onset of back pain feel better in just 2 weeks. About 8 in 10 people feel better by 6 weeks.  CAUSES Some common causes of back pain include:  Strain of the muscles or ligaments supporting the spine.   Wear and tear (degeneration) of the spinal discs.   Arthritis.   Direct injury to the back.  DIAGNOSIS Most of the time, the direct cause of low back pain is not known.However, back pain can be treated effectively even when the exact cause of the pain is unknown.Answering your caregiver's questions about your overall health and symptoms is one of the most accurate ways to make sure the cause of your pain is not dangerous. If your caregiver needs more information, he or she may order lab work or imaging tests (X-rays or MRIs).However, even if imaging tests show changes in your back, this usually does not require surgery. HOME CARE INSTRUCTIONS For many people, back pain returns.Since low back pain is rarely dangerous, it is often a condition that people can learn to manageon their own.   Remain active. It is stressful on the back to sit or stand in one place. Do not sit, drive, or stand in one place for more than 30 minutes at a time. Take short walks on level surfaces as soon as pain allows.Try to increase the length of time you walk each day.   Do not stay in bed.Resting more than 1 or 2 days can delay your recovery.   Do not avoid exercise or work.Your body is made to move.It is not dangerous to be active, even though your back may hurt.Your back will likely heal faster if you return to being active before your pain is gone.   Pay attention to your body when you bend and lift. Many people have less discomfortwhen lifting if they bend their knees, keep the load close to their  bodies,and avoid twisting. Often, the most comfortable positions are those that put less stress on your recovering back.   Find a comfortable position to sleep. Use a firm mattress and lie on your side with your knees slightly bent. If you lie on your back, put a pillow under your knees.   Only take over-the-counter or prescription medicines as directed by your caregiver. Over-the-counter medicines to reduce pain and inflammation are often the most helpful.Your caregiver may prescribe muscle relaxant drugs.These medicines help dull your pain so you can more quickly return to your normal activities and healthy exercise.   Put ice on the injured area.   Put ice in a plastic bag.   Place a towel between your skin and the bag.   Leave the ice on for 15 to 20 minutes, 3 to 4 times a day for the first 2 to 3 days. After that, ice and heat may be alternated to reduce pain and spasms.   Ask your caregiver about trying back exercises and gentle massage. This may be of some benefit.   Avoid feeling anxious or stressed.Stress increases muscle tension and can worsen back pain.It is important to recognize when you are anxious or stressed and learn ways to manage it.Exercise is a great option.  SEEK MEDICAL CARE IF:  You have pain that is not relieved with rest or medicine.   You have   pain that does not improve in 1 week.   You have new symptoms.   You are generally not feeling well.  SEEK IMMEDIATE MEDICAL CARE IF:   You have pain that radiates from your back into your legs.   You develop new bowel or bladder control problems.   You have unusual weakness or numbness in your arms or legs.   You develop nausea or vomiting.   You develop abdominal pain.   You feel faint.  Document Released: 10/29/2005 Document Revised: 10/18/2011 Document Reviewed: 03/19/2011 [employer] Patient Information 2012 [employer]. 

## 2012-03-20 NOTE — ED Provider Notes (Signed)
History     CSN: [id number]  Arrival date & time 03/20/12  1509   First MD Initiated Contact with Patient 03/20/12 1606      No chief complaint on file.   (Consider location/radiation/quality/duration/timing/severity/associated sxs/prior treatment) The history is provided by the patient.   patient here with lower back pain x3-4 weeks. Pain is located at her left SI joint and does not radiate. Symptoms sharp and worse with movement better with rest. Has used Flexeril and anti-inflammatories with limited relief. No loss of bowel or bladder function. No lower extremity paresthesias.  Past Medical History  Diagnosis Date  . Hypertension   . High cholesterol   . GERD (gastroesophageal reflux disease)   . Irregular heart rate     Past Surgical History  Procedure Date  . Cholecystectomy     Family History  Problem Relation Age of Onset  . Heart disease Father     History  Substance Use Topics  . Smoking status: Never Smoker   . Smokeless tobacco: Not on file  . Alcohol Use: Yes     OCCASIONALLY    OB History    Grav Para Term Preterm Abortions TAB SAB Ect Mult Living                  Review of Systems  All other systems reviewed and are negative.    Allergies  Review of patient's allergies indicates no known allergies.  Home Medications   Current Outpatient Rx  Name Route Sig Dispense Refill  . ACETAMINOPHEN 500 MG PO TABS Oral Take 1,000 mg by mouth every 6 (six) hours as needed.    . ASPIRIN 325 MG PO TABS Oral Take 325 mg by mouth daily.    Marland Kitchen VITAMIN D 1000 UNITS PO TABS Oral Take 1,000 Units by mouth daily.    . CYCLOBENZAPRINE HCL 10 MG PO TABS Oral Take 10 mg by mouth once.    Marland Kitchen LISINOPRIL-HYDROCHLOROTHIAZIDE 20-12.5 MG PO TABS Oral Take 1 tablet by mouth daily.      Marland Kitchen PANTOPRAZOLE SODIUM 40 MG PO TBEC Oral Take 40 mg by mouth daily.      Marland Kitchen PRESCRIPTION MEDICATION Oral Take 1 tablet by mouth once. Unknown pain med    . PRESCRIPTION MEDICATION Topical  Apply 1 application topically daily. Dexamethasone cream    . SIMVASTATIN 40 MG PO TABS Oral Take 40 mg by mouth at bedtime.      Marland Kitchen VITAMIN B-12 100 MCG PO TABS Oral Take 50 mcg by mouth daily.      BP 103/52  Pulse 90  Temp(Src) 97.4 F (36.3 C) (Oral)  Resp 20  SpO2 97%  Physical Exam  Nursing note and vitals reviewed. Constitutional: She is oriented to person, place, and time. Vital signs are normal. She appears well-developed and well-nourished.  Non-toxic appearance. No distress.  HENT:  Head: Normocephalic and atraumatic.  Eyes: Conjunctivae, EOM and lids are normal. Pupils are equal, round, and reactive to light.  Neck: Normal range of motion. Neck supple. No tracheal deviation present. No mass present.  Cardiovascular: Normal rate, regular rhythm and normal heart sounds.  Exam reveals no gallop.   No murmur heard. Pulmonary/Chest: Effort normal and breath sounds normal. No stridor. No respiratory distress. She has no decreased breath sounds. She has no wheezes. She has no rhonchi. She has no rales.  Abdominal: Soft. Normal appearance and bowel sounds are normal. She exhibits no distension. There is no tenderness. There is no rebound  and no CVA tenderness.  Musculoskeletal: Normal range of motion. She exhibits no edema and no tenderness.       Left si joint pain  Neurological: She is alert and oriented to person, place, and time. She has normal strength. No cranial nerve deficit or sensory deficit. GCS eye subscore is 4. GCS verbal subscore is 5. GCS motor subscore is 6.  Skin: Skin is warm and dry. No abrasion and no rash noted.  Psychiatric: She has a normal mood and affect. Her speech is normal and behavior is normal.    ED Course  Procedures (including critical care time)  Labs Reviewed - No data to display No results found.   No diagnosis found.    MDM  Patient given pain medications and feels better. Patient and related prior to discharge as she knew this  properly.        Toy Baker, MD 03/20/12 [phone]

## 2013-01-22 ENCOUNTER — Encounter: Payer: Self-pay | Admitting: Internal Medicine

## 2013-02-15 IMAGING — CR DG LUMBAR SPINE COMPLETE 4+V
5 series · 5 of 5 positions shown · non-contrast
Comparison: None.

CLINICAL DATA: Severe pain.

LUMBAR SPINE - COMPLETE 4+ VIEW

[t l-spine a.p.]
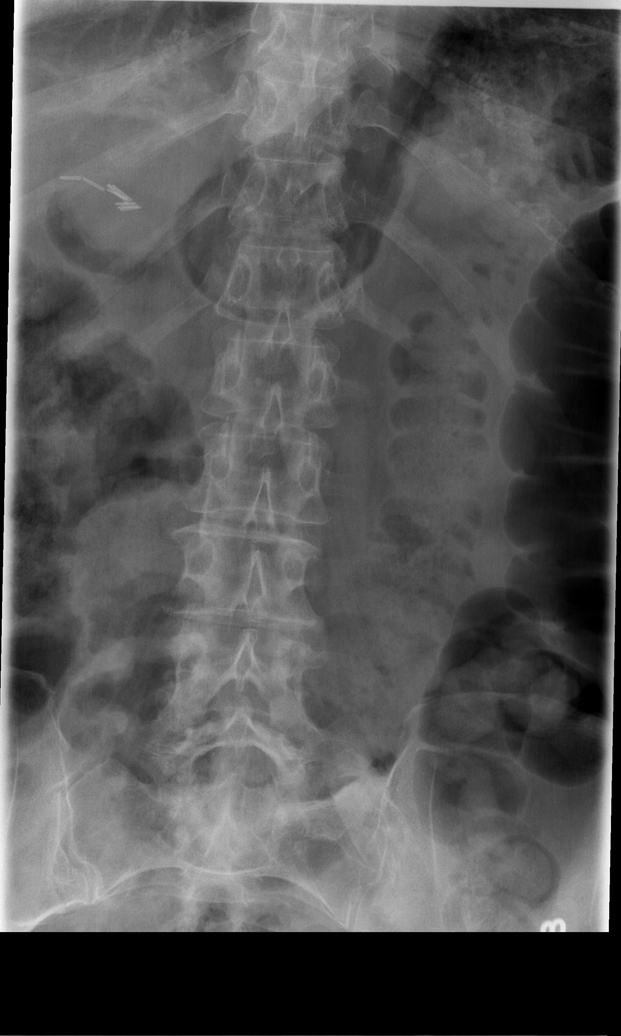

[t l-spine oblique exposure]
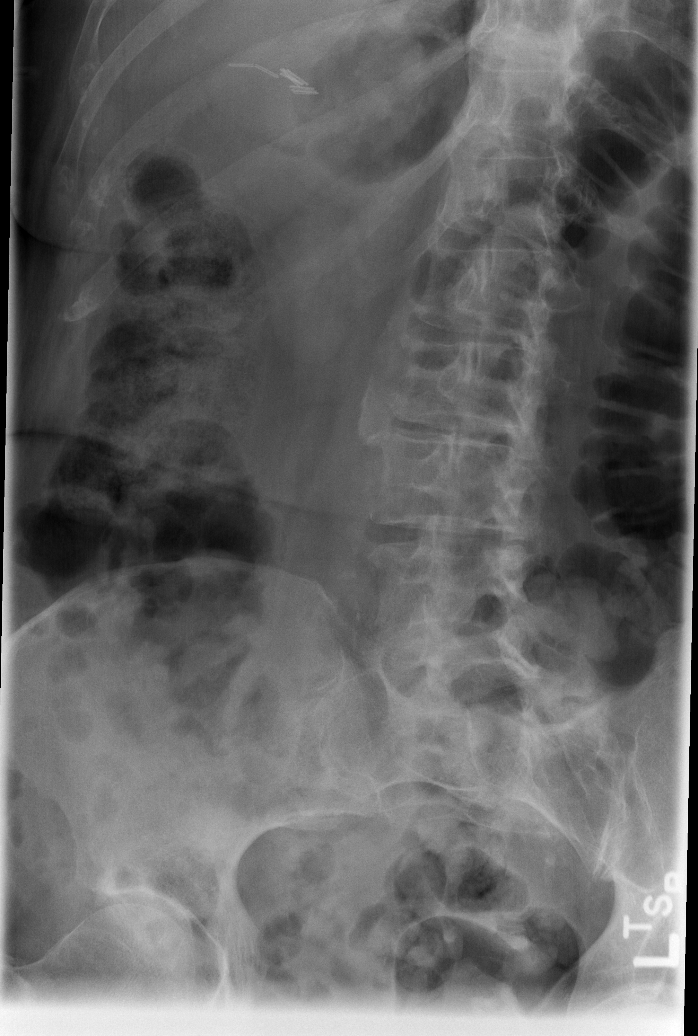

[w x table l-spine (1 of 3)]
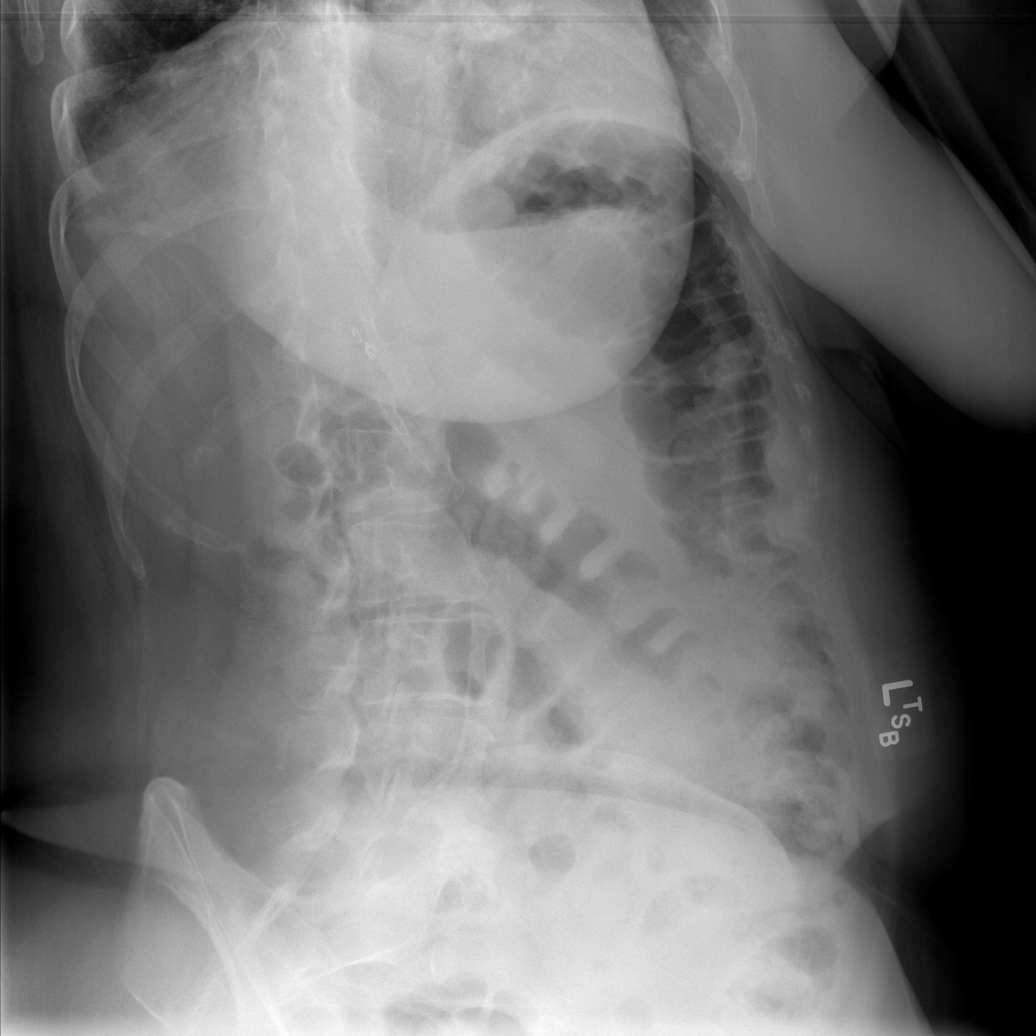

[w x table l-spine (2 of 3)]
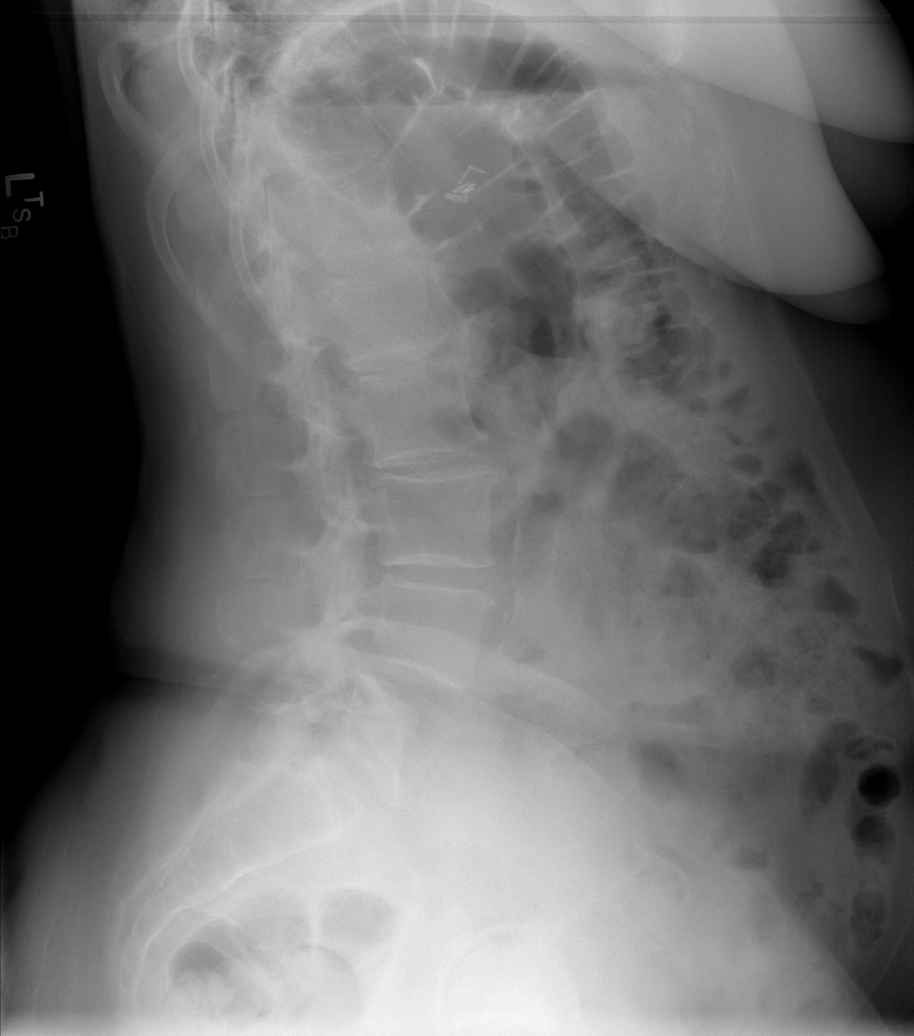

[w x table l-spine (3 of 3)]
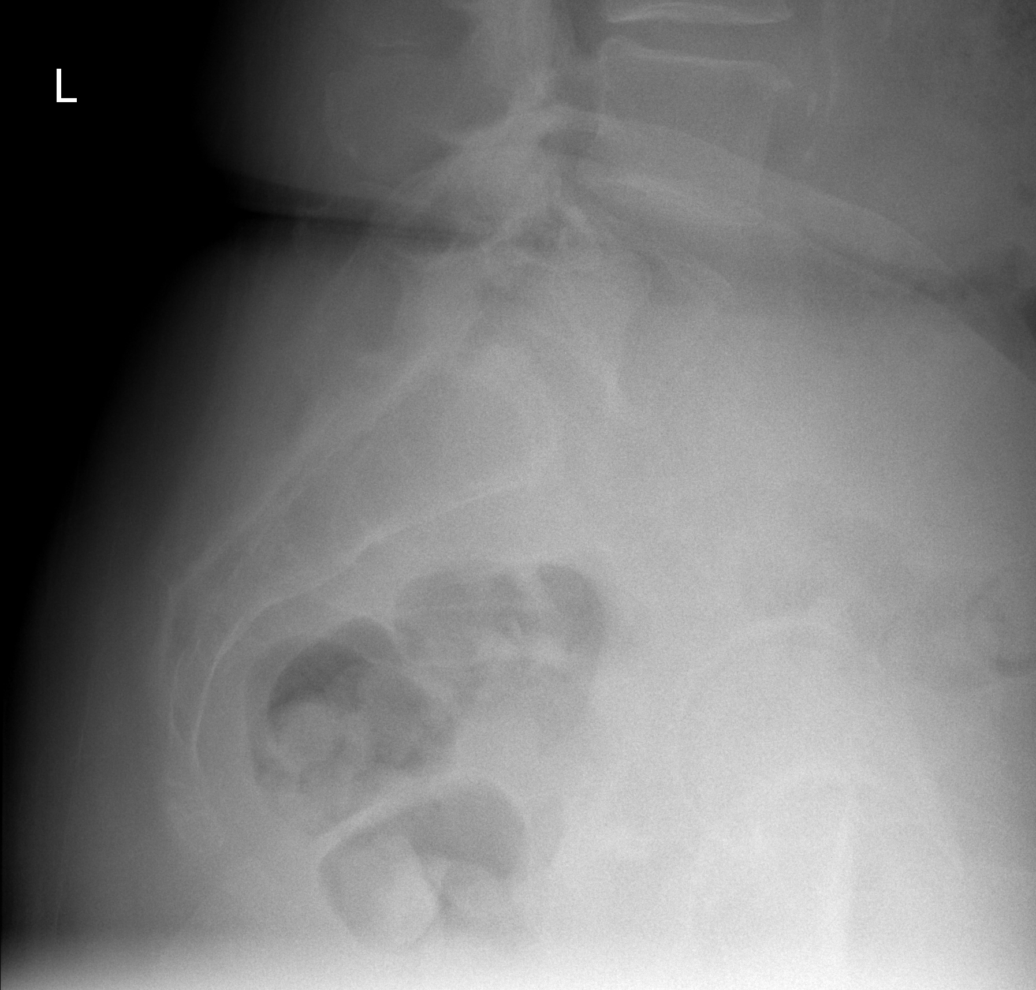

[5 of 5 positions shown; findings below may reference images not displayed]

FINDINGS: No fracture.  No subluxation.  Loss of disc height is
noted at L4-5 and L5-S1.  Lower lumbar facet osteoarthritis is
noted bilaterally.  SI joints are unremarkable.
IMPRESSION: Osteopenia without acute bony findings.

## 2013-02-18 ENCOUNTER — Encounter: Payer: Self-pay | Admitting: Internal Medicine

## 2013-03-12 ENCOUNTER — Other Ambulatory Visit: Payer: Self-pay | Admitting: Internal Medicine

## 2013-03-12 DIAGNOSIS — Z1231 Encounter for screening mammogram for malignant neoplasm of breast: Secondary | ICD-10-CM

## 2013-03-24 ENCOUNTER — Ambulatory Visit (AMBULATORY_SURGERY_CENTER): Payer: Medicare Other | Admitting: *Deleted

## 2013-03-24 VITALS — Ht 67.0 in | Wt 207.2 lb

## 2013-03-24 DIAGNOSIS — Z1211 Encounter for screening for malignant neoplasm of colon: Secondary | ICD-10-CM

## 2013-03-24 MED ORDER — MOVIPREP 100 G PO SOLR: ORAL | Status: DC

## 2013-03-25 ENCOUNTER — Encounter: Payer: Self-pay | Admitting: Internal Medicine

## 2013-04-21 ENCOUNTER — Encounter: Payer: Self-pay | Admitting: Internal Medicine

## 2013-04-21 ENCOUNTER — Ambulatory Visit (AMBULATORY_SURGERY_CENTER): Payer: Medicare Other | Admitting: Internal Medicine

## 2013-04-21 VITALS — BP 136/67 | HR 72 | Temp 97.6°F | Resp 18 | Ht 67.0 in | Wt 207.0 lb

## 2013-04-21 DIAGNOSIS — D126 Benign neoplasm of colon, unspecified: Secondary | ICD-10-CM

## 2013-04-21 DIAGNOSIS — Z1211 Encounter for screening for malignant neoplasm of colon: Secondary | ICD-10-CM

## 2013-04-21 DIAGNOSIS — Z860100 Personal history of colon polyps, unspecified: Secondary | ICD-10-CM

## 2013-04-21 MED ORDER — SODIUM CHLORIDE 0.9 % IV SOLN: 500.0000 mL | INTRAVENOUS | Status: DC

## 2013-04-21 NOTE — Progress Notes (Signed)
Lidocaine-40mg IV prior to Propofol InductionPropofol given over incremental dosages 

## 2013-04-21 NOTE — Progress Notes (Signed)
Patient did not have preoperative order for IV antibiotic SSI prophylaxis. (G8918)  Patient did not experience any of the following events: a burn prior to discharge; a fall within the facility; wrong site/side/patient/procedure/implant event; or a hospital transfer or hospital admission upon discharge from the facility. (G8907)  

## 2013-04-21 NOTE — Patient Instructions (Addendum)
YOU HAD AN ENDOSCOPIC PROCEDURE TODAY AT THE [facility]: Refer to the procedure report that was given to you for any specific questions about what was found during the examination.  If the procedure report does not answer your questions, please call your gastroenterologist to clarify.  If you requested that your care partner not be given the details of your procedure findings, then the procedure report has been included in a sealed envelope for you to review at your convenience later.  YOU SHOULD EXPECT: Some feelings of bloating in the abdomen. Passage of more gas than usual.  Walking can help get rid of the air that was put into your GI tract during the procedure and reduce the bloating. If you had a lower endoscopy (such as a colonoscopy or flexible sigmoidoscopy) you may notice spotting of blood in your stool or on the toilet paper. If you underwent a bowel prep for your procedure, then you may not have a normal bowel movement for a few days.  DIET: Your first meal following the procedure should be a light meal and then it is ok to progress to your normal diet.  A half-sandwich or bowl of soup is an example of a good first meal.  Heavy or fried foods are harder to digest and may make you feel nauseous or bloated.  Likewise meals heavy in dairy and vegetables can cause extra gas to form and this can also increase the bloating.  Drink plenty of fluids but you should avoid alcoholic beverages for 24 hours.  ACTIVITY: Your care partner should take you home directly after the procedure.  You should plan to take it easy, moving slowly for the rest of the day.  You can resume normal activity the day after the procedure however you should NOT DRIVE or use heavy machinery for 24 hours (because of the sedation medicines used during the test).    SYMPTOMS TO REPORT IMMEDIATELY: A gastroenterologist can be reached at any hour.  During normal business hours, 8:30 AM to 5:00 PM Monday through Friday,  call [phone].  After hours and on weekends, please call the GI answering service at [phone] who will take a message and have the physician on call contact you.   Following lower endoscopy (colonoscopy or flexible sigmoidoscopy):  Excessive amounts of blood in the stool  Significant tenderness or worsening of abdominal pains  Swelling of the abdomen that is new, acute  Fever of 100F or higher   FOLLOW UP: If any biopsies were taken you will be contacted by phone or by letter within the next 1-3 weeks.  Call your gastroenterologist if you have not heard about the biopsies in 3 weeks.  Our staff will call the home number listed on your records the next business day following your procedure to check on you and address any questions or concerns that you may have at that time regarding the information given to you following your procedure. This is a courtesy call and so if there is no answer at the home number and we have not heard from you through the emergency physician on call, we will assume that you have returned to your regular daily activities without incident.  SIGNATURES/CONFIDENTIALITY: You and/or your care partner have signed paperwork which will be entered into your electronic medical record.  These signatures attest to the fact that that the information above on your After Visit Summary has been reviewed and is understood.  Full responsibility of the confidentiality of   this discharge information lies with you and/or your care-partner.  Follow up colonoscopy in 5 years 

## 2013-04-21 NOTE — Progress Notes (Signed)
Called to room to assist during endoscopic procedure.  Patient ID and intended procedure confirmed with present staff. Received instructions for my participation in the procedure from the performing physician.  

## 2013-04-21 NOTE — Op Note (Signed)
[facility] [address] [place], [address]   COLONOSCOPY PROCEDURE REPORT  PATIENT: Traci [name], Traci [name]  MR#: [id number] BIRTHDATE: Aug 08, 1941 , 71  yrs. old GENDER: Female ENDOSCOPIST: Roxy Cedar, MD REFERRED [id number] Program Recall PROCEDURE DATE:  04/21/2013 PROCEDURE:   Colonoscopy with snare polypectomy    x 3 ASA CLASS:   Class II INDICATIONS:Patient's personal history of adenomatous colon polyps. Index 01-2010 with MULTILPE small adenomas MEDICATIONS: MAC sedation, administered by CRNA and propofol (Diprivan) 100mg  IV  DESCRIPTION OF PROCEDURE:   After the risks benefits and alternatives of the procedure were thoroughly explained, informed consent was obtained.  A digital rectal exam revealed no abnormalities of the rectum.   The LB [id number]  endoscope was introduced through the anus and advanced to the cecum, which was identified by both the appendix and ileocecal valve. No adverse events experienced.   The quality of the prep was excellent, using MoviPrep  The instrument was then slowly withdrawn as the colon was fully examined.      COLON FINDINGS: Three diminutive polyps were found at the cecum and in the rectum.  A polypectomy was performed with a cold snare.  The resection was complete and the polyp tissue was completely retrieved.   Moderate diverticulosis was noted The finding was in the left colon.   The colon mucosa was otherwise normal. Retroflexed views revealed internal hemorrhoids. The time to cecum=2 minutes 06 seconds.  Withdrawal time=12 minutes 12 seconds. The scope was withdrawn and the procedure completed. COMPLICATIONS: There were no complications.  ENDOSCOPIC IMPRESSION: 1.   Three diminutive polyps were found at the cecum and in the rectum; polypectomy was performed with a cold snare 2.   Moderate diverticulosis was noted in the left colon 3.   The colon mucosa was otherwise normal  RECOMMENDATIONS: 1.  Follow up colonoscopy in 5 years   eSigned:  Roxy Cedar, MD 04/21/2013 10:36 AM   cc: Geoffry Paradise, MD and The Patient   PATIENT NAME:  [name], [name] MR#: [id number]

## 2013-04-22 ENCOUNTER — Telehealth: Payer: Self-pay | Admitting: *Deleted

## 2013-04-22 NOTE — Telephone Encounter (Signed)
  Follow up Call-  Call back number 04/21/2013  Post procedure Call Back phone  # [phone]  Permission to leave phone message Yes     Patient questions:  Do you have a fever, pain , or abdominal swelling? no Pain Score  0 *  Have you tolerated food without any problems? yes  Have you been able to return to your normal activities? yes  Do you have any questions about your discharge instructions: Diet   no Medications  no Follow up visit  no  Do you have questions or concerns about your Care? no  Actions: * If pain score is 4 or above: No action needed, pain <4.

## 2013-04-27 ENCOUNTER — Encounter: Payer: Self-pay | Admitting: Internal Medicine

## 2013-04-29 ENCOUNTER — Ambulatory Visit
Admission: RE | Admit: 2013-04-29 | Discharge: 2013-04-29 | Disposition: A | Discharge status: Home / Self Care | Payer: Medicare Other | Source: Ambulatory Visit | Attending: Internal Medicine | Admitting: Internal Medicine

## 2013-04-29 DIAGNOSIS — Z1231 Encounter for screening mammogram for malignant neoplasm of breast: Secondary | ICD-10-CM

## 2015-03-16 DIAGNOSIS — R8290 Unspecified abnormal findings in urine: Secondary | ICD-10-CM | POA: Diagnosis not present

## 2015-03-16 DIAGNOSIS — Z008 Encounter for other general examination: Secondary | ICD-10-CM | POA: Diagnosis not present

## 2015-03-16 DIAGNOSIS — N390 Urinary tract infection, site not specified: Secondary | ICD-10-CM | POA: Diagnosis not present

## 2015-03-16 DIAGNOSIS — I10 Essential (primary) hypertension: Secondary | ICD-10-CM | POA: Diagnosis not present

## 2015-03-16 DIAGNOSIS — E559 Vitamin D deficiency, unspecified: Secondary | ICD-10-CM | POA: Diagnosis not present

## 2015-03-23 DIAGNOSIS — N183 Chronic kidney disease, stage 3 (moderate): Secondary | ICD-10-CM | POA: Diagnosis not present

## 2015-03-23 DIAGNOSIS — I10 Essential (primary) hypertension: Secondary | ICD-10-CM | POA: Diagnosis not present

## 2015-03-23 DIAGNOSIS — Z1389 Encounter for screening for other disorder: Secondary | ICD-10-CM | POA: Diagnosis not present

## 2015-03-23 DIAGNOSIS — Z1212 Encounter for screening for malignant neoplasm of rectum: Secondary | ICD-10-CM | POA: Diagnosis not present

## 2015-03-23 DIAGNOSIS — I129 Hypertensive chronic kidney disease with stage 1 through stage 4 chronic kidney disease, or unspecified chronic kidney disease: Secondary | ICD-10-CM | POA: Diagnosis not present

## 2015-03-23 DIAGNOSIS — M1990 Unspecified osteoarthritis, unspecified site: Secondary | ICD-10-CM | POA: Diagnosis not present

## 2015-03-23 DIAGNOSIS — E785 Hyperlipidemia, unspecified: Secondary | ICD-10-CM | POA: Diagnosis not present

## 2015-03-23 DIAGNOSIS — I358 Other nonrheumatic aortic valve disorders: Secondary | ICD-10-CM | POA: Diagnosis not present

## 2015-03-23 DIAGNOSIS — Z0000 Encounter for general adult medical examination without abnormal findings: Secondary | ICD-10-CM | POA: Diagnosis not present

## 2015-04-04 DIAGNOSIS — D1801 Hemangioma of skin and subcutaneous tissue: Secondary | ICD-10-CM | POA: Diagnosis not present

## 2015-04-04 DIAGNOSIS — L814 Other melanin hyperpigmentation: Secondary | ICD-10-CM | POA: Diagnosis not present

## 2015-04-04 DIAGNOSIS — D225 Melanocytic nevi of trunk: Secondary | ICD-10-CM | POA: Diagnosis not present

## 2015-04-04 DIAGNOSIS — L720 Epidermal cyst: Secondary | ICD-10-CM | POA: Diagnosis not present

## 2015-04-04 DIAGNOSIS — L821 Other seborrheic keratosis: Secondary | ICD-10-CM | POA: Diagnosis not present

## 2015-04-14 ENCOUNTER — Encounter: Payer: Self-pay | Admitting: Internal Medicine

## 2015-05-06 DIAGNOSIS — I10 Essential (primary) hypertension: Secondary | ICD-10-CM | POA: Diagnosis not present

## 2015-05-06 DIAGNOSIS — K219 Gastro-esophageal reflux disease without esophagitis: Secondary | ICD-10-CM | POA: Diagnosis not present

## 2015-05-06 DIAGNOSIS — R55 Syncope and collapse: Secondary | ICD-10-CM | POA: Diagnosis not present

## 2015-05-06 DIAGNOSIS — E785 Hyperlipidemia, unspecified: Secondary | ICD-10-CM | POA: Diagnosis not present

## 2015-05-06 DIAGNOSIS — S91202A Unspecified open wound of left great toe with damage to nail, initial encounter: Secondary | ICD-10-CM | POA: Diagnosis not present

## 2015-05-06 DIAGNOSIS — S80811A Abrasion, right lower leg, initial encounter: Secondary | ICD-10-CM | POA: Diagnosis not present

## 2015-05-06 DIAGNOSIS — S50311A Abrasion of right elbow, initial encounter: Secondary | ICD-10-CM | POA: Diagnosis not present

## 2015-05-06 DIAGNOSIS — Z79899 Other long term (current) drug therapy: Secondary | ICD-10-CM | POA: Diagnosis not present

## 2015-05-06 DIAGNOSIS — S80812A Abrasion, left lower leg, initial encounter: Secondary | ICD-10-CM | POA: Diagnosis not present

## 2015-05-06 DIAGNOSIS — R079 Chest pain, unspecified: Secondary | ICD-10-CM | POA: Diagnosis not present

## 2015-05-20 DIAGNOSIS — E785 Hyperlipidemia, unspecified: Secondary | ICD-10-CM | POA: Diagnosis not present

## 2015-05-20 DIAGNOSIS — Z6831 Body mass index (BMI) 31.0-31.9, adult: Secondary | ICD-10-CM | POA: Diagnosis not present

## 2015-05-20 DIAGNOSIS — I10 Essential (primary) hypertension: Secondary | ICD-10-CM | POA: Diagnosis not present

## 2015-05-20 DIAGNOSIS — R55 Syncope and collapse: Secondary | ICD-10-CM | POA: Diagnosis not present

## 2015-05-20 DIAGNOSIS — R531 Weakness: Secondary | ICD-10-CM | POA: Diagnosis not present

## 2015-06-02 DIAGNOSIS — R55 Syncope and collapse: Secondary | ICD-10-CM | POA: Insufficient documentation

## 2015-06-15 ENCOUNTER — Ambulatory Visit (INDEPENDENT_AMBULATORY_CARE_PROVIDER_SITE_OTHER): Payer: Commercial Managed Care - HMO

## 2015-06-15 DIAGNOSIS — R55 Syncope and collapse: Secondary | ICD-10-CM

## 2015-06-21 NOTE — Progress Notes (Signed)
Attempted to contact patient on cell phone - voice mail has not been set up.

## 2015-06-22 NOTE — Progress Notes (Signed)
NA at cell #.  Appt with Dr. Sallyanne Kuster 06/24/15 - will discuss results at visit.  Results sent to Dr. Reynaldo Minium.

## 2015-06-24 ENCOUNTER — Ambulatory Visit (INDEPENDENT_AMBULATORY_CARE_PROVIDER_SITE_OTHER): Payer: Commercial Managed Care - HMO | Admitting: Cardiovascular Disease

## 2015-06-24 ENCOUNTER — Encounter: Payer: Self-pay | Admitting: Cardiovascular Disease

## 2015-06-24 VITALS — BP 126/70 | HR 72 | Resp 16 | Ht 67.0 in | Wt 200.0 lb

## 2015-06-24 DIAGNOSIS — R011 Cardiac murmur, unspecified: Secondary | ICD-10-CM

## 2015-06-24 DIAGNOSIS — R55 Syncope and collapse: Secondary | ICD-10-CM | POA: Diagnosis not present

## 2015-06-24 MED ORDER — LISINOPRIL 20 MG PO TABS: 20.0000 mg | ORAL_TABLET | Freq: Every day | ORAL | Status: DC

## 2015-06-24 NOTE — Patient Instructions (Signed)
Medication Instructions:   STOP LISINOPRIL/ HCTZ  START LISINOPRIL 20MG  DAILY    Testing/Procedures:  Your physician has requested that you have an echocardiogram. Echocardiography is a painless test that uses sound waves to create images of your heart. It provides your doctor with information about the size and shape of your heart and how well your heart's chambers and valves are working. This procedure takes approximately one hour. There are no restrictions for this procedure.    Follow-Up:  ONE YEAR  Any Other Special Instructions Will Be Listed Below (If Applicable).

## 2015-06-25 ENCOUNTER — Encounter: Payer: Self-pay | Admitting: Cardiovascular Disease

## 2015-06-25 NOTE — Progress Notes (Signed)
Patient ID: Traci [name], female   DOB: Apr 03, 1941, 74 y.o.   MRN: [id number]     Cardiology Office Note   Date:  06/25/2015   ID:  ARPITA FENTRESS, DOB 1940-11-26, MRN [id number]  PCP:  Geoffery Lyons, MD  Cardiologist:   Sanda Klein, MD   Chief Complaint  Patient presents with  . Establish Care    Syncope, weakness, and HBP. Patient has felt light headed, dizzy, and has had chest pain/pressure.      History of Present Illness: Traci [name] is a 74 y.o. female who presents for  Evaluation of an episode of syncope. She was visiting [place] in June of this year. It was a hot day. [place] but felt lightheaded and dizzy. Her gait was a little unsteady and she stubbed her toe against the sidewalk. This was very painful and she actually avulsed her toenail. Subsequently she felt clammy and flushed.  A few seconds later she lost consciousness. After just a few seconds she apparently regained consciousness but was feeling very weak. She tried to stand up but as soon as she did she passed out again. She was then taken to the emergency room where her evaluation was apparently unremarkable. Her recovery was complete and rapid. She received intravenous fluids in the emergency room. She has never experienced syncope before.   she has treated hypertension and hyperlipidemia and has gastroesophageal reflux disease and has undergone cholecystectomy.    Past Medical History  Diagnosis Date  . Hypertension   . High cholesterol   . GERD (gastroesophageal reflux disease)   . Irregular heart rate     Past Surgical History  Procedure Laterality Date  . Cholecystectomy       Current Outpatient Prescriptions  Medication Sig Dispense Refill  . aspirin 325 MG tablet Take 325 mg by mouth daily.    Marland Kitchen atorvastatin (LIPITOR) 20 MG tablet Take 20 mg by mouth daily.    . cholecalciferol (VITAMIN D) 1000  UNITS tablet Take 1,000 Units by mouth daily.    . pantoprazole (PROTONIX) 40 MG tablet Take 40 mg by mouth daily.      Marland Kitchen PRESCRIPTION MEDICATION Apply 1 application topically daily. Dexamethasone cream    . vitamin B-12 (CYANOCOBALAMIN) 100 MCG tablet Take 50 mcg by mouth daily.    Marland Kitchen lisinopril (PRINIVIL,ZESTRIL) 20 MG tablet Take 1 tablet (20 mg total) by mouth daily. 90 tablet 3   No current facility-administered medications for this visit.    Allergies:   Review of patient's allergies indicates no known allergies.    Social History:  The patient  reports that she has never smoked. She has never used smokeless tobacco. She reports that she drinks alcohol. She reports that she does not use illicit drugs.   Family History:  The patient's family history includes Heart disease in her father. There is no history of Colon cancer.    ROS:  Please see the history of present illness.    Otherwise, review of systems positive for none.   All other systems are reviewed and negative.    PHYSICAL EXAM: VS:  BP 126/70 mmHg  Pulse 72  Resp 16  Ht 5\' 7"  (1.702 m)  Wt 200 lb (90.719 kg)  BMI 31.32 kg/m2 , BMI Body mass index is 31.32 kg/(m^2).  General: Alert, oriented x3, no distress Head: no evidence of trauma, PERRL, EOMI, no exophtalmos or lid  lag, no myxedema, no xanthelasma; normal ears, nose and oropharynx Neck: normal jugular venous pulsations and no hepatojugular reflux; brisk carotid pulses without delay and no carotid bruits Chest: clear to auscultation, no signs of consolidation by percussion or palpation, normal fremitus, symmetrical and full respiratory excursions Cardiovascular: normal position and quality of the apical impulse, regular rhythm, normal first and second heart sounds, no  murmurs, rubs or gallops Abdomen: no tenderness or distention, no masses by palpation, no abnormal pulsatility or arterial bruits, normal bowel sounds, no hepatosplenomegaly Extremities: no  clubbing, cyanosis or edema; 2+ radial, ulnar and brachial pulses bilaterally; 2+ right femoral, posterior tibial and dorsalis pedis pulses; 2+ left femoral, posterior tibial and dorsalis pedis pulses; no subclavian or femoral bruits Neurological: grossly nonfocal Psych: euthymic mood, full affect   EKG:  EKG is ordered today. The ekg ordered today demonstrates  Normal sinus rhythm, delayed R-wave progression across the anterior precordium   Recent Labs: No results found for requested labs within last 365 days.    Lipid Panel    Component Value Date/Time   CHOL  02/11/2011 0349    135        ATP III CLASSIFICATION:  <200     mg/dL   Desirable  200-239  mg/dL   Borderline High  >=240    mg/dL   High          TRIG 66 02/11/2011 0349   HDL 60 02/11/2011 0349   CHOLHDL 2.3 02/11/2011 0349   VLDL 13 02/11/2011 0349   LDLCALC  02/11/2011 0349    62        Total Cholesterol/HDL:CHD Risk Coronary Heart Disease Risk Table                     Men   Women  1/2 Average Risk   3.4   3.3  Average Risk       5.0   4.4  2 X Average Risk   9.6   7.1  3 X Average Risk  23.4   11.0        Use the calculated Patient Ratio above and the CHD Risk Table to determine the patient's CHD Risk.        ATP III CLASSIFICATION (LDL):  <100     mg/dL   Optimal  100-129  mg/dL   Near or Above                    Optimal  130-159  mg/dL   Borderline  160-189  mg/dL   High  >190     mg/dL   Very High      Wt Readings from Last 3 Encounters:  06/24/15 200 lb (90.719 kg)  04/21/13 207 lb (93.895 kg)  03/24/13 207 lb 3.2 oz (93.985 kg)     ASSESSMENT AND PLAN:  1.  Syncope, almost certainly vasovagal. The history is highly compatible with neurally mediated syncope. She was taking a thiazide diuretic and had been upright for long period of time on a warm day. She had plenty of typical prodrome. Her symptoms were immediately worsened she tried to stand up again. I think she would do better without  a thiazide diuretic and have switched her antihypertensives to lisinopril alone. It is reasonable to perform an echocardiogram to look for structural heart disease. I anticipate this will be normal. If this is the case, I don't think further cardiac evaluation would be necessary at the current time.  Current medicines are reviewed at length with the patient today.  The patient does not have concerns regarding medicines.   Labs/ tests ordered today include:  Orders Placed This Encounter  Procedures  . EKG 12-Lead  . ECHOCARDIOGRAM COMPLETE     Patient Instructions  Medication Instructions:   STOP LISINOPRIL/ HCTZ  START LISINOPRIL 20MG  DAILY    Testing/Procedures:  Your physician has requested that you have an echocardiogram. Echocardiography is a painless test that uses sound waves to create images of your heart. It provides your doctor with information about the size and shape of your heart and how well your heart's chambers and valves are working. This procedure takes approximately one hour. There are no restrictions for this procedure.    Follow-Up:  ONE YEAR  Any Other Special Instructions Will Be Listed Below (If Applicable).       Mikael Spray, MD  06/25/2015 6:33 PM    Sanda Klein, MD, [facility] [phone] office [phone] pager

## 2015-07-06 ENCOUNTER — Ambulatory Visit (HOSPITAL_COMMUNITY): Payer: Commercial Managed Care - HMO | Attending: Cardiology

## 2015-07-06 ENCOUNTER — Other Ambulatory Visit: Payer: Self-pay

## 2015-07-06 DIAGNOSIS — I340 Nonrheumatic mitral (valve) insufficiency: Secondary | ICD-10-CM | POA: Diagnosis not present

## 2015-07-06 DIAGNOSIS — I350 Nonrheumatic aortic (valve) stenosis: Secondary | ICD-10-CM | POA: Diagnosis not present

## 2015-07-06 DIAGNOSIS — R011 Cardiac murmur, unspecified: Secondary | ICD-10-CM | POA: Diagnosis not present

## 2015-07-06 DIAGNOSIS — I071 Rheumatic tricuspid insufficiency: Secondary | ICD-10-CM | POA: Insufficient documentation

## 2015-09-26 DIAGNOSIS — Z6832 Body mass index (BMI) 32.0-32.9, adult: Secondary | ICD-10-CM | POA: Diagnosis not present

## 2015-09-26 DIAGNOSIS — E785 Hyperlipidemia, unspecified: Secondary | ICD-10-CM | POA: Diagnosis not present

## 2015-09-26 DIAGNOSIS — N183 Chronic kidney disease, stage 3 (moderate): Secondary | ICD-10-CM | POA: Diagnosis not present

## 2015-09-26 DIAGNOSIS — D692 Other nonthrombocytopenic purpura: Secondary | ICD-10-CM | POA: Diagnosis not present

## 2015-09-26 DIAGNOSIS — M1990 Unspecified osteoarthritis, unspecified site: Secondary | ICD-10-CM | POA: Diagnosis not present

## 2015-09-26 DIAGNOSIS — I129 Hypertensive chronic kidney disease with stage 1 through stage 4 chronic kidney disease, or unspecified chronic kidney disease: Secondary | ICD-10-CM | POA: Diagnosis not present

## 2015-09-26 DIAGNOSIS — I10 Essential (primary) hypertension: Secondary | ICD-10-CM | POA: Diagnosis not present

## 2015-09-30 ENCOUNTER — Other Ambulatory Visit: Payer: Self-pay

## 2015-09-30 DIAGNOSIS — Z1231 Encounter for screening mammogram for malignant neoplasm of breast: Secondary | ICD-10-CM

## 2015-10-10 DIAGNOSIS — H5210 Myopia, unspecified eye: Secondary | ICD-10-CM | POA: Diagnosis not present

## 2015-10-10 DIAGNOSIS — H524 Presbyopia: Secondary | ICD-10-CM | POA: Diagnosis not present

## 2015-10-19 ENCOUNTER — Ambulatory Visit
Admission: RE | Admit: 2015-10-19 | Discharge: 2015-10-19 | Disposition: A | Discharge status: Home / Self Care | Payer: Commercial Managed Care - HMO | Source: Ambulatory Visit

## 2015-10-19 DIAGNOSIS — Z1231 Encounter for screening mammogram for malignant neoplasm of breast: Secondary | ICD-10-CM

## 2016-02-10 DIAGNOSIS — J111 Influenza due to unidentified influenza virus with other respiratory manifestations: Secondary | ICD-10-CM | POA: Diagnosis not present

## 2016-02-10 DIAGNOSIS — Z6832 Body mass index (BMI) 32.0-32.9, adult: Secondary | ICD-10-CM | POA: Diagnosis not present

## 2016-02-10 DIAGNOSIS — R05 Cough: Secondary | ICD-10-CM | POA: Diagnosis not present

## 2016-02-10 DIAGNOSIS — R509 Fever, unspecified: Secondary | ICD-10-CM | POA: Diagnosis not present

## 2016-02-10 DIAGNOSIS — R52 Pain, unspecified: Secondary | ICD-10-CM | POA: Diagnosis not present

## 2016-03-19 DIAGNOSIS — E784 Other hyperlipidemia: Secondary | ICD-10-CM | POA: Diagnosis not present

## 2016-03-19 DIAGNOSIS — I10 Essential (primary) hypertension: Secondary | ICD-10-CM | POA: Diagnosis not present

## 2016-03-19 DIAGNOSIS — E559 Vitamin D deficiency, unspecified: Secondary | ICD-10-CM | POA: Diagnosis not present

## 2016-03-26 DIAGNOSIS — M1990 Unspecified osteoarthritis, unspecified site: Secondary | ICD-10-CM | POA: Diagnosis not present

## 2016-03-26 DIAGNOSIS — E784 Other hyperlipidemia: Secondary | ICD-10-CM | POA: Diagnosis not present

## 2016-03-26 DIAGNOSIS — D692 Other nonthrombocytopenic purpura: Secondary | ICD-10-CM | POA: Diagnosis not present

## 2016-03-26 DIAGNOSIS — Z0000 Encounter for general adult medical examination without abnormal findings: Secondary | ICD-10-CM | POA: Diagnosis not present

## 2016-03-26 DIAGNOSIS — I10 Essential (primary) hypertension: Secondary | ICD-10-CM | POA: Diagnosis not present

## 2016-03-26 DIAGNOSIS — I358 Other nonrheumatic aortic valve disorders: Secondary | ICD-10-CM | POA: Diagnosis not present

## 2016-03-26 DIAGNOSIS — E559 Vitamin D deficiency, unspecified: Secondary | ICD-10-CM | POA: Diagnosis not present

## 2016-03-26 DIAGNOSIS — I129 Hypertensive chronic kidney disease with stage 1 through stage 4 chronic kidney disease, or unspecified chronic kidney disease: Secondary | ICD-10-CM | POA: Diagnosis not present

## 2016-03-26 DIAGNOSIS — N183 Chronic kidney disease, stage 3 (moderate): Secondary | ICD-10-CM | POA: Diagnosis not present

## 2016-03-27 ENCOUNTER — Other Ambulatory Visit: Payer: Self-pay | Admitting: Cardiovascular Disease

## 2016-03-27 NOTE — Telephone Encounter (Signed)
Rx Refill

## 2016-04-03 DIAGNOSIS — Z1212 Encounter for screening for malignant neoplasm of rectum: Secondary | ICD-10-CM | POA: Diagnosis not present

## 2016-06-25 DIAGNOSIS — R351 Nocturia: Secondary | ICD-10-CM | POA: Diagnosis not present

## 2016-06-25 DIAGNOSIS — N3941 Urge incontinence: Secondary | ICD-10-CM | POA: Diagnosis not present

## 2016-06-25 DIAGNOSIS — N8111 Cystocele, midline: Secondary | ICD-10-CM | POA: Diagnosis not present

## 2016-06-25 DIAGNOSIS — N816 Rectocele: Secondary | ICD-10-CM | POA: Diagnosis not present

## 2016-06-28 ENCOUNTER — Other Ambulatory Visit: Payer: Self-pay | Admitting: Cardiovascular Disease

## 2016-06-28 NOTE — Telephone Encounter (Signed)
REFILL 

## 2016-07-12 ENCOUNTER — Encounter: Payer: Self-pay | Admitting: Cardiovascular Disease

## 2016-07-12 ENCOUNTER — Ambulatory Visit (INDEPENDENT_AMBULATORY_CARE_PROVIDER_SITE_OTHER): Payer: Commercial Managed Care - HMO | Admitting: Cardiovascular Disease

## 2016-07-12 VITALS — BP 133/72 | HR 69 | Ht 67.0 in | Wt 206.4 lb

## 2016-07-12 DIAGNOSIS — R55 Syncope and collapse: Secondary | ICD-10-CM | POA: Diagnosis not present

## 2016-07-12 DIAGNOSIS — I350 Nonrheumatic aortic (valve) stenosis: Secondary | ICD-10-CM | POA: Diagnosis not present

## 2016-07-12 DIAGNOSIS — I10 Essential (primary) hypertension: Secondary | ICD-10-CM | POA: Diagnosis not present

## 2016-07-12 DIAGNOSIS — E785 Hyperlipidemia, unspecified: Secondary | ICD-10-CM | POA: Diagnosis not present

## 2016-07-12 DIAGNOSIS — E669 Obesity, unspecified: Secondary | ICD-10-CM

## 2016-07-12 NOTE — Progress Notes (Signed)
Cardiology Office Note    Date:  07/12/2016   ID:  BRYNNLEIGH [name], DOB 1941-05-30, MRN [id number]  PCP:  Geoffery Lyons, MD  Cardiologist:   Sanda Klein, MD   Chief Complaint  Patient presents with  . Annual Exam    minor cramping in legs at night. edema in ankles.     History of Present Illness:  Traci [name] is a 75 y.o. female with a single episode of vasovagal syncope related to pain roughly one year ago and with mild aortic valve stenosis, hyperlipidemia and hypertension. She returns for routine follow-up. She has had a few woozy episodes that she was able to quickly recognize and take measures to abate without loss of consciousness. She denies significant problems with exertional dyspnea, although she does feel that she is "out of shape" and denies exertional angina, full blown syncope, palpitations, leg edema, claudication or focal neurological complaints. Her aortic valve was mildly stenotic by echo in 2016. The mean gradient was around 15 mmHg. The valve area was clearly underestimated by the continuity equation.  Past Medical History:  Diagnosis Date  . GERD (gastroesophageal reflux disease)   . High cholesterol   . Hypertension   . Irregular heart rate     Past Surgical History:  Procedure Laterality Date  . CHOLECYSTECTOMY      Current Medications: Outpatient Medications Prior to Visit  Medication Sig Dispense Refill  . aspirin 325 MG tablet Take 325 mg by mouth daily.    Marland Kitchen atorvastatin (LIPITOR) 20 MG tablet Take 20 mg by mouth daily.    . cholecalciferol (VITAMIN D) 1000 UNITS tablet Take 1,000 Units by mouth daily.    Marland Kitchen lisinopril (PRINIVIL,ZESTRIL) 20 MG tablet TAKE 1 TABLET BY MOUTH EVERY DAY 90 tablet 0  . pantoprazole (PROTONIX) 40 MG tablet Take 40 mg by mouth daily.      Marland Kitchen PRESCRIPTION MEDICATION Apply 1 application topically daily. Dexamethasone cream    . vitamin B-12 (CYANOCOBALAMIN) 100 MCG tablet Take 50 mcg by mouth daily.     No  facility-administered medications prior to visit.      Allergies:   Review of patient's allergies indicates no known allergies.   Social History   Social History  . Marital status: Widowed    Spouse name: Traci [name]  . Number of children: Traci [name]  . Years of education: Traci [name]   Social History Main Topics  . Smoking status: Never Smoker  . Smokeless tobacco: Never Used  . Alcohol use Yes     Comment: OCCASIONALLY  . Drug use: No  . Sexual activity: Not Asked   Other Topics Concern  . None   Social History Narrative  . None     Family History:  The patient's family history includes Heart disease in her father.   ROS:   Please see the history of present illness.    ROS All other systems reviewed and are negative.   PHYSICAL EXAM:   VS:  BP 133/72   Pulse 69   Ht 5\' 7"  (1.702 m)   Wt 206 lb 6.4 oz (93.6 kg)   BMI 32.33 kg/m    GEN: Well nourished, well developed, in no acute distress  HEENT: normal  Neck: no JVD, carotid bruits, or masses Cardiac: RRR; early peaking 2/6 aortic ejection murmur in the aortic focus radiating towards the carotids, no diastolic murmurs, rubs, or gallops,no edema  Respiratory:  clear to auscultation bilaterally, normal work of breathing GI: soft, nontender, nondistended, +  BS MS: no deformity or atrophy  Skin: warm and dry, no rash Neuro:  Alert and Oriented x 3, Strength and sensation are intact Psych: euthymic mood, full affect  Wt Readings from Last 3 Encounters:  07/12/16 206 lb 6.4 oz (93.6 kg)  06/24/15 200 lb (90.7 kg)  04/21/13 207 lb (93.9 kg)      Studies/Labs Reviewed:   EKG:  EKG is ordered today.  The ekg ordered today demonstrates NSR.  Recent Labs: No results found for requested labs within last 8760 hours.   Lipid Panel    Component Value Date/Time   CHOL  02/11/2011 0349    135        ATP III CLASSIFICATION:  <200     mg/dL   Desirable  200-239  mg/dL   Borderline High  >=240    mg/dL   High          TRIG 66  02/11/2011 0349   HDL 60 02/11/2011 0349   CHOLHDL 2.3 02/11/2011 0349   VLDL 13 02/11/2011 0349   LDLCALC  02/11/2011 0349    62        Total Cholesterol/HDL:CHD Risk Coronary Heart Disease Risk Table                     Men   Women  1/2 Average Risk   3.4   3.3  Average Risk       5.0   4.4  2 X Average Risk   9.6   7.1  3 X Average Risk  23.4   11.0        Use the calculated Patient Ratio above and the CHD Risk Table to determine the patient's CHD Risk.        ATP III CLASSIFICATION (LDL):  <100     mg/dL   Optimal  100-129  mg/dL   Near or Above                    Optimal  130-159  mg/dL   Borderline  160-189  mg/dL   High  >190     mg/dL   Very High     ASSESSMENT:    1. Syncope and collapse   2. Mild aortic stenosis   3. Essential hypertension   4. Hyperlipidemia   5. Mild obesity      PLAN:  In order of problems listed above:  1. Syncope: No recurrence, occasionally mild prodromal symptoms. Encourage her to stay well hydrated and avoid sudden changes in position and known triggers. 2. AS: Mild by echo, currently asymptomatic. Discussed warning signs for symptomatic aortic stenosis. Periodic follow-up with echo. 3. HTN: Still has excellent blood pressure control without the thiazide diuretic. Discontinuing this will lessen the chance of syncope. Continue with ACE inhibitor alone. 4. HLP: On statin, will get labs from Dr. Reynaldo Minium. 5. Obesity: Weight loss and regular physical exercise encouraged.    Medication Adjustments/Labs and Tests Ordered: Current medicines are reviewed at length with the patient today.  Concerns regarding medicines are outlined above.  Medication changes, Labs and Tests ordered today are listed in the Patient Instructions below. Patient Instructions  NO CHANGE WITH CURRENT MEDICATIONS   Your physician wants you to follow-up in:12 MONTHS WITH DR Sallyanne Kuster You will receive a reminder letter in the mail two months in advance. If you don't  receive a letter, please call our office to schedule the follow-up appointment.  If you need a refill  on your cardiac medications before your next appointment, please call your pharmacy.     Signed, Sanda Klein, MD  07/12/2016 1:12 PM    [facility] [place], [place], [place]  [address] Phone: [phone]; Fax: [phone]

## 2016-07-12 NOTE — Patient Instructions (Signed)
NO CHANGE WITH CURRENT MEDICATIONS   Your physician wants you to follow-up in:12 MONTHS WITH DR Sallyanne Kuster You will receive a reminder letter in the mail two months in advance. If you don't receive a letter, please call our office to schedule the follow-up appointment.  If you need a refill on your cardiac medications before your next appointment, please call your pharmacy.

## 2016-09-24 DIAGNOSIS — E784 Other hyperlipidemia: Secondary | ICD-10-CM | POA: Diagnosis not present

## 2016-09-24 DIAGNOSIS — Z23 Encounter for immunization: Secondary | ICD-10-CM | POA: Diagnosis not present

## 2016-09-24 DIAGNOSIS — M1990 Unspecified osteoarthritis, unspecified site: Secondary | ICD-10-CM | POA: Diagnosis not present

## 2016-09-24 DIAGNOSIS — N183 Chronic kidney disease, stage 3 (moderate): Secondary | ICD-10-CM | POA: Diagnosis not present

## 2016-09-24 DIAGNOSIS — I10 Essential (primary) hypertension: Secondary | ICD-10-CM | POA: Diagnosis not present

## 2016-09-24 DIAGNOSIS — I358 Other nonrheumatic aortic valve disorders: Secondary | ICD-10-CM | POA: Diagnosis not present

## 2016-09-24 DIAGNOSIS — D692 Other nonthrombocytopenic purpura: Secondary | ICD-10-CM | POA: Diagnosis not present

## 2016-09-24 DIAGNOSIS — I129 Hypertensive chronic kidney disease with stage 1 through stage 4 chronic kidney disease, or unspecified chronic kidney disease: Secondary | ICD-10-CM | POA: Diagnosis not present

## 2016-09-24 DIAGNOSIS — E559 Vitamin D deficiency, unspecified: Secondary | ICD-10-CM | POA: Diagnosis not present

## 2016-12-27 ENCOUNTER — Other Ambulatory Visit: Payer: Self-pay | Admitting: Cardiovascular Disease

## 2016-12-27 NOTE — Telephone Encounter (Signed)
,  Rx(s) sent to pharmacy electronically.  

## 2017-02-07 ENCOUNTER — Telehealth: Payer: Self-pay | Admitting: Cardiovascular Disease

## 2017-02-07 NOTE — Telephone Encounter (Signed)
Please call,pt says her heart have been out of rhythm.I made her appt with Dr C on Tuesday(02-12-17).

## 2017-02-08 NOTE — Telephone Encounter (Signed)
Pt informed to go to ER if sx persist,worsen or other sx develop before appt

## 2017-02-08 NOTE — Telephone Encounter (Signed)
Spoke with pt she states that she feels her heart racing, fatigue and tightness in her chest. Denies and N&V, or chest pain. Pt states that her BP yesterday 8 am 147/78 HR 75 @125  pm 125/70 HR 89, 128/67 HR 89 and 151/75 HR 83. When prompted how often/long she states that she does not know. Pt has appt 02-12-17.

## 2017-02-12 ENCOUNTER — Ambulatory Visit (INDEPENDENT_AMBULATORY_CARE_PROVIDER_SITE_OTHER): Payer: Medicare HMO | Admitting: Cardiovascular Disease

## 2017-02-12 ENCOUNTER — Encounter: Payer: Self-pay | Admitting: Cardiovascular Disease

## 2017-02-12 VITALS — BP 130/80 | HR 68 | Ht 67.0 in | Wt 208.0 lb

## 2017-02-12 DIAGNOSIS — R55 Syncope and collapse: Secondary | ICD-10-CM | POA: Diagnosis not present

## 2017-02-12 DIAGNOSIS — I10 Essential (primary) hypertension: Secondary | ICD-10-CM

## 2017-02-12 DIAGNOSIS — E669 Obesity, unspecified: Secondary | ICD-10-CM

## 2017-02-12 DIAGNOSIS — E7800 Pure hypercholesterolemia, unspecified: Secondary | ICD-10-CM

## 2017-02-12 DIAGNOSIS — I498 Other specified cardiac arrhythmias: Secondary | ICD-10-CM

## 2017-02-12 DIAGNOSIS — I350 Nonrheumatic aortic (valve) stenosis: Secondary | ICD-10-CM | POA: Diagnosis not present

## 2017-02-12 NOTE — Patient Instructions (Addendum)
Medication Instructions: Your physician recommends that you continue on your current medications as directed. Please refer to the Current Medication list given to you today.   Procedures/Testing: Your physician has recommended that you wear a 14 day event monitor at [facility] as soon as possible. Event monitors are medical devices that record the heart's electrical activity. Doctors most often [place] these monitors to diagnose arrhythmias. Arrhythmias are problems with the speed or rhythm of the heartbeat. The monitor is a small, portable device. You can wear one while you do your normal daily activities. This is usually used to diagnose what is causing palpitations/syncope (passing out).    Follow-Up: Your physician wants you to follow-up in: 12 months with Dr. Sallyanne Kuster You will receive a reminder letter in the mail two months in advance. If you don't receive a letter, please call our office to schedule the follow-up appointment.     If you need a refill on your cardiac medications before your next appointment, please call your pharmacy.

## 2017-02-12 NOTE — Progress Notes (Signed)
Cardiology Office Note    Date:  02/12/2017   ID:  Traci [name], DOB 21-Apr-1941, MRN [id number]  PCP:  Geoffery Lyons, MD  Cardiologist:   Sanda Klein, MD   Chief Complaint  Patient presents with  . Follow-up    pt c/o palpitations of and on     History of Present Illness:  Traci [name] is a 76 y.o. female with a single episode of vasovagal syncope over a year ago and with mild aortic valve stenosis, hyperlipidemia and hypertension.   Her home blood pressure monitor has shown "irregular rhythm" on several occasions, although she is not aware of palpitations. Has not had new syncope, but had one episode of her typical prodromal symptoms while driving. She was very emotional since her companion had taken a severe fall. Pulled the car over and recovered completely after a couple of minutes  She denies significant problems with exertional dyspnea or exertional angina, full blown syncope, palpitations, leg edema, claudication or focal neurological complaints. Her aortic valve was mildly stenotic by echo in 2016. The mean gradient was around 15 mmHg. The valve area was clearly underestimated by the continuity equation.  Past Medical History:  Diagnosis Date  . GERD (gastroesophageal reflux disease)   . High cholesterol   . Hypertension   . Irregular heart rate     Past Surgical History:  Procedure Laterality Date  . CHOLECYSTECTOMY      Current Medications: Outpatient Medications Prior to Visit  Medication Sig Dispense Refill  . aspirin 325 MG tablet Take 325 mg by mouth daily.    Marland Kitchen atorvastatin (LIPITOR) 20 MG tablet Take 20 mg by mouth daily.    . cholecalciferol (VITAMIN D) 1000 UNITS tablet Take 1,000 Units by mouth daily.    Marland Kitchen lisinopril (PRINIVIL,ZESTRIL) 20 MG tablet TAKE 1 TABLET BY MOUTH EVERY DAY 90 tablet 1  . pantoprazole (PROTONIX) 40 MG tablet Take 40 mg by mouth daily.      Marland Kitchen PRESCRIPTION MEDICATION Apply 1 application topically daily. Dexamethasone  cream    . vitamin B-12 (CYANOCOBALAMIN) 100 MCG tablet Take 50 mcg by mouth daily.     No facility-administered medications prior to visit.      Allergies:   Patient has no known allergies.   Social History   Social History  . Marital status: Widowed    Spouse name: N/A  . Number of children: N/A  . Years of education: N/A   Social History Main Topics  . Smoking status: Never Smoker  . Smokeless tobacco: Never Used  . Alcohol use Yes     Comment: OCCASIONALLY  . Drug use: No  . Sexual activity: Not Asked   Other Topics Concern  . None   Social History Narrative  . None     Family History:  The patient's family history includes Heart disease in her father.   ROS:   Please see the history of present illness.    ROS All other systems reviewed and are negative.   PHYSICAL EXAM:   VS:  BP 130/80   Pulse 68   Ht 5\' 7"  (1.702 m)   Wt 94.3 kg (208 lb)   BMI 32.58 kg/m    GEN: Well nourished, well developed, in no acute distress  HEENT: normal  Neck: no JVD, carotid bruits, or masses Cardiac: RRR; early peaking 2/6 aortic ejection murmur in the aortic focus radiating towards the carotids, no diastolic murmurs, rubs, or gallops,no edema  Respiratory:  clear  to auscultation bilaterally, normal work of breathing GI: soft, nontender, nondistended, + BS MS: no deformity or atrophy  Skin: warm and dry, no rash Neuro:  Alert and Oriented x 3, Strength and sensation are intact Psych: euthymic mood, full affect  Wt Readings from Last 3 Encounters:  02/12/17 94.3 kg (208 lb)  07/12/16 93.6 kg (206 lb 6.4 oz)  06/24/15 90.7 kg (200 lb)      Studies/Labs Reviewed:   EKG:  EKG is ordered today.  The ekg ordered today demonstrates NSR.  Recent Labs: No results found for requested labs within last 8760 hours.     ASSESSMENT:    1. Syncope and collapse   2. Other cardiac arrhythmia   3. Mild aortic stenosis   4. Essential hypertension   5. Pure  hypercholesterolemia   6. Mild obesity      PLAN:  In order of problems listed above:  1. Syncope: No recurrence, occasionally mild prodromal symptoms. Encourage her to stay well hydrated and avoid sudden changes in position and known triggers. 2. Arrhythmia: Has been detected clinically, have recommended that she wear a 14 day event monitor. If atrial fibrillation is identified then she should receive anticoagulation due to her risk factors of age, gender, hypertension and she would be an appropriate candidate for direct oral anticoagulant. If other arrhythmias are found, they will not probably require specific therapy, since she is asymptomatic 3. AS: Mild by echo, currently asymptomatic. Discussed warning signs for symptomatic aortic stenosis. Periodic follow-up with echo. 4. HTN: Still has excellent blood pressure control, continue with ACE inhibitor alone. 5. HLP: On statin, will get labs from Dr. Reynaldo Minium. 6. Obesity: Weight loss and regular physical exercise encouraged.    Medication Adjustments/Labs and Tests Ordered: Current medicines are reviewed at length with the patient today.  Concerns regarding medicines are outlined above.  Medication changes, Labs and Tests ordered today are listed in the Patient Instructions below. Patient Instructions  Medication Instructions: Your physician recommends that you continue on your current medications as directed. Please refer to the Current Medication list given to you today.   Procedures/Testing: Your physician has recommended that you wear a 14 day event monitor at [facility] as soon as possible. Event monitors are medical devices that record the heart's electrical activity. Doctors most often [place] these monitors to diagnose arrhythmias. Arrhythmias are problems with the speed or rhythm of the heartbeat. The monitor is a small, portable device. You can wear one while you do your normal daily activities. This is usually used to diagnose what is  causing palpitations/syncope (passing out).    Follow-Up: Your physician wants you to follow-up in: 12 months with Dr. Sallyanne Kuster You will receive a reminder letter in the mail two months in advance. If you don't receive a letter, please call our office to schedule the follow-up appointment.     If you need a refill on your cardiac medications before your next appointment, please call your pharmacy.      Signed, Sanda Klein, MD  02/12/2017 1:05 PM    [facility] [place] [place], [place], [place]  [address] Phone: [phone]; Fax: [phone]

## 2017-02-13 NOTE — Telephone Encounter (Signed)
Seen by Dr. Sallyanne Kuster yesterday and concerns addressed at visit.

## 2017-03-11 ENCOUNTER — Other Ambulatory Visit: Payer: Self-pay | Admitting: Cardiovascular Disease

## 2017-03-11 ENCOUNTER — Ambulatory Visit (INDEPENDENT_AMBULATORY_CARE_PROVIDER_SITE_OTHER): Payer: Medicare HMO

## 2017-03-11 DIAGNOSIS — I498 Other specified cardiac arrhythmias: Secondary | ICD-10-CM

## 2017-03-11 DIAGNOSIS — R55 Syncope and collapse: Secondary | ICD-10-CM

## 2017-03-27 ENCOUNTER — Other Ambulatory Visit: Payer: Self-pay | Admitting: Cardiovascular Disease

## 2017-03-27 DIAGNOSIS — I10 Essential (primary) hypertension: Secondary | ICD-10-CM | POA: Diagnosis not present

## 2017-03-27 DIAGNOSIS — E784 Other hyperlipidemia: Secondary | ICD-10-CM | POA: Diagnosis not present

## 2017-03-27 DIAGNOSIS — E559 Vitamin D deficiency, unspecified: Secondary | ICD-10-CM | POA: Diagnosis not present

## 2017-04-03 DIAGNOSIS — I358 Other nonrheumatic aortic valve disorders: Secondary | ICD-10-CM | POA: Diagnosis not present

## 2017-04-03 DIAGNOSIS — E559 Vitamin D deficiency, unspecified: Secondary | ICD-10-CM | POA: Diagnosis not present

## 2017-04-03 DIAGNOSIS — I129 Hypertensive chronic kidney disease with stage 1 through stage 4 chronic kidney disease, or unspecified chronic kidney disease: Secondary | ICD-10-CM | POA: Diagnosis not present

## 2017-04-03 DIAGNOSIS — N183 Chronic kidney disease, stage 3 (moderate): Secondary | ICD-10-CM | POA: Diagnosis not present

## 2017-04-03 DIAGNOSIS — Z1389 Encounter for screening for other disorder: Secondary | ICD-10-CM | POA: Diagnosis not present

## 2017-04-03 DIAGNOSIS — Z0000 Encounter for general adult medical examination without abnormal findings: Secondary | ICD-10-CM | POA: Diagnosis not present

## 2017-04-03 DIAGNOSIS — D692 Other nonthrombocytopenic purpura: Secondary | ICD-10-CM | POA: Diagnosis not present

## 2017-04-03 DIAGNOSIS — M1990 Unspecified osteoarthritis, unspecified site: Secondary | ICD-10-CM | POA: Diagnosis not present

## 2017-04-03 DIAGNOSIS — E784 Other hyperlipidemia: Secondary | ICD-10-CM | POA: Diagnosis not present

## 2017-04-04 DIAGNOSIS — Z1212 Encounter for screening for malignant neoplasm of rectum: Secondary | ICD-10-CM | POA: Diagnosis not present

## 2017-04-22 DIAGNOSIS — H524 Presbyopia: Secondary | ICD-10-CM | POA: Diagnosis not present

## 2017-04-22 DIAGNOSIS — H26493 Other secondary cataract, bilateral: Secondary | ICD-10-CM | POA: Diagnosis not present

## 2017-06-13 DIAGNOSIS — H26493 Other secondary cataract, bilateral: Secondary | ICD-10-CM | POA: Diagnosis not present

## 2017-06-13 DIAGNOSIS — H04123 Dry eye syndrome of bilateral lacrimal glands: Secondary | ICD-10-CM | POA: Diagnosis not present

## 2017-06-21 DIAGNOSIS — H26491 Other secondary cataract, right eye: Secondary | ICD-10-CM | POA: Diagnosis not present

## 2017-06-26 ENCOUNTER — Other Ambulatory Visit: Payer: Self-pay | Admitting: Cardiovascular Disease

## 2017-07-12 DIAGNOSIS — H26492 Other secondary cataract, left eye: Secondary | ICD-10-CM | POA: Diagnosis not present

## 2017-12-27 ENCOUNTER — Other Ambulatory Visit: Payer: Self-pay | Admitting: Cardiovascular Disease

## 2017-12-27 NOTE — Telephone Encounter (Signed)
REFILL 

## 2018-02-17 ENCOUNTER — Ambulatory Visit: Payer: Medicare HMO | Admitting: Cardiovascular Disease

## 2018-02-27 ENCOUNTER — Encounter: Payer: Self-pay | Admitting: Cardiovascular Disease

## 2018-02-27 ENCOUNTER — Ambulatory Visit: Payer: Medicare HMO | Admitting: Cardiovascular Disease

## 2018-02-27 VITALS — BP 120/74 | HR 74 | Ht 67.0 in | Wt 214.6 lb

## 2018-02-27 DIAGNOSIS — Z8679 Personal history of other diseases of the circulatory system: Secondary | ICD-10-CM | POA: Diagnosis not present

## 2018-02-27 DIAGNOSIS — I350 Nonrheumatic aortic (valve) stenosis: Secondary | ICD-10-CM

## 2018-02-27 DIAGNOSIS — E669 Obesity, unspecified: Secondary | ICD-10-CM

## 2018-02-27 DIAGNOSIS — R55 Syncope and collapse: Secondary | ICD-10-CM

## 2018-02-27 DIAGNOSIS — E7800 Pure hypercholesterolemia, unspecified: Secondary | ICD-10-CM | POA: Diagnosis not present

## 2018-02-27 DIAGNOSIS — I10 Essential (primary) hypertension: Secondary | ICD-10-CM | POA: Diagnosis not present

## 2018-02-27 NOTE — Patient Instructions (Signed)
Your physician has recommended you make the following change in your medication 1. STOP Aspirin  Dr Sallyanne Kuster recommends that you schedule a follow-up appointment in 12 months. You will receive a reminder letter in the mail two months in advance. If you don't receive a letter, please call our office to schedule the follow-up appointment.  If you need a refill on your cardiac medications before your next appointment, please call your pharmacy.

## 2018-02-27 NOTE — Progress Notes (Signed)
Cardiology Office Note    Date:  02/28/2018   ID:  Traci [name], DOB [birth year], MRN [id number]  PCP:  Burnard Bunting, MD  Cardiologist:   Sanda Klein, MD   No chief complaint on file.   History of Present Illness:  Traci [name] is a 76 y.o. female with a single episode of vasovagal syncope over a year ago and with mild aortic valve stenosis, hyperlipidemia and hypertension.  She had a single episode of paroxysmal atrial fibrillation in 2012 immediately after a laparoscopic cholecystectomy when she was also hypokalemic.  She has not had any new episodes of syncope. She has not had any problems with palpitations ( in the 7 years since her cholecystectomy).  The patient specifically denies any chest pain at rest exertion, dyspnea at rest or with exertion, orthopnea, paroxysmal nocturnal dyspnea, syncope, palpitations, focal neurological deficits, intermittent claudication, lower extremity edema, unexplained weight gain, cough, hemoptysis or wheezing.  er aortic valve was mildly stenotic by echo in 2016. The mean gradient was around 15 mmHg. The valve area was clearly underestimated by the continuity equation.  Past Medical History:  Diagnosis Date  . GERD (gastroesophageal reflux disease)   . High cholesterol   . Hypertension   . Irregular heart rate     Past Surgical History:  Procedure Laterality Date  . CHOLECYSTECTOMY      Current Medications: Outpatient Medications Prior to Visit  Medication Sig Dispense Refill  . atorvastatin (LIPITOR) 20 MG tablet Take 20 mg by mouth daily.    . cholecalciferol (VITAMIN D) 1000 UNITS tablet Take 1,000 Units by mouth daily.    Marland Kitchen lisinopril (PRINIVIL,ZESTRIL) 20 MG tablet Take 1 tablet (20 mg total) by mouth daily. KEEP OV. 90 tablet 0  . pantoprazole (PROTONIX) 40 MG tablet Take 40 mg by mouth daily.      Marland Kitchen aspirin 325 MG tablet Take 325 mg by mouth daily.    Marland Kitchen PRESCRIPTION MEDICATION Apply 1 application topically daily.  Dexamethasone cream     No facility-administered medications prior to visit.      Allergies:   Patient has no known allergies.   Social History   Socioeconomic History  . Marital status: Widowed    Spouse name: Not on file  . Number of children: Not on file  . Years of education: Not on file  . Highest education level: Not on file  Occupational History  . Not on file  Social Needs  . Financial resource strain: Not on file  . Food insecurity:    Worry: Not on file    Inability: Not on file  . Transportation needs:    Medical: Not on file    Non-medical: Not on file  Tobacco Use  . Smoking status: Never Smoker  . Smokeless tobacco: Never Used  Substance and Sexual Activity  . Alcohol use: Yes    Comment: OCCASIONALLY  . Drug use: No  . Sexual activity: Not on file  Lifestyle  . Physical activity:    Days per week: Not on file    Minutes per session: Not on file  . Stress: Not on file  Relationships  . Social connections:    Talks on phone: Not on file    Gets together: Not on file    Attends religious service: Not on file    Active member of club or organization: Not on file    Attends meetings of clubs or organizations: Not on file    Relationship status:  Not on file  Other Topics Concern  . Not on file  Social History Narrative  . Not on file     Family History:  The patient's family history includes Heart disease in her father.   ROS:   Please see the history of present illness.    ROS All other systems reviewed and are negative.   PHYSICAL EXAM:   VS:  BP 120/74   Pulse 74   Ht 5\' 7"  (1.702 m)   Wt 214 lb 9.6 oz (97.3 kg)   BMI 33.61 kg/m      General: Alert, oriented x3, no distress, obese Head: no evidence of trauma, PERRL, EOMI, no exophtalmos or lid lag, no myxedema, no xanthelasma; normal ears, nose and oropharynx Neck: normal jugular venous pulsations and no hepatojugular reflux; brisk carotid pulses without delay and no carotid  bruits Chest: clear to auscultation, no signs of consolidation by percussion or palpation, normal fremitus, symmetrical and full respiratory excursions Cardiovascular: normal position and quality of the apical impulse, regular rhythm, normal first and second heart sounds, 2/6 early peaking systolic ejection murmur, no diastolic murmurs, no apical murmurs, rubs or gallops Abdomen: no tenderness or distention, no masses by palpation, no abnormal pulsatility or arterial bruits, normal bowel sounds, no hepatosplenomegaly Extremities: no clubbing, cyanosis or edema; 2+ radial, ulnar and brachial pulses bilaterally; 2+ right femoral, posterior tibial and dorsalis pedis pulses; 2+ left femoral, posterior tibial and dorsalis pedis pulses; no subclavian or femoral bruits Neurological: grossly nonfocal Psych: Normal mood and affect   Wt Readings from Last 3 Encounters:  02/27/18 214 lb 9.6 oz (97.3 kg)  02/12/17 208 lb (94.3 kg)  07/12/16 206 lb 6.4 oz (93.6 kg)      Studies/Labs Reviewed:   EKG:  EKG is ordered today.  The ekg ordered today demonstrates NSR.  QTc 424 ms  Recent Labs: No results found for requested labs within last 8760 hours.     ASSESSMENT:    1. Neurocardiogenic syncope   2. History of atrial fibrillation   3. Mild aortic stenosis   4. Essential hypertension   5. Hypercholesterolemia   6. Mild obesity      PLAN:  In order of problems listed above:  1. Syncope: No recurrence, occasionally mild prodromal symptoms. Encourage her to stay well hydrated and avoid sudden changes in position and known triggers. 2. AFib: She had postoperative A. fib around the time of her cholecystectomy but subsequent evaluation that has included multiple ECGs and event monitor has not shown any evidence of recurrence.  We will discontinue the aspirin.  If true atrial fibrillation is again documented she will need full anticoagulation (CHADSVasc 3: age, gender, HTN) 3. AS: Mild by echo,  currently asymptomatic.  Reviewed the symptoms of aortic stenosis, exertional angina/dyspnea/syncope. 4. HTN: Excellent control 5. HLP: On statin 6. Obesity: Encourage weight loss.      Medication Adjustments/Labs and Tests Ordered: Current medicines are reviewed at length with the patient today.  Concerns regarding medicines are outlined above.  Medication changes, Labs and Tests ordered today are listed in the Patient Instructions below. Patient Instructions  Your physician has recommended you make the following change in your medication 1. STOP Aspirin  Dr Sallyanne Kuster recommends that you schedule a follow-up appointment in 12 months. You will receive a reminder letter in the mail two months in advance. If you don't receive a letter, please call our office to schedule the follow-up appointment.  If you need a refill on  your cardiac medications before your next appointment, please call your pharmacy.    Signed, Sanda Klein, MD  02/28/2018 4:48 PM    [address] [address], [place], [place]  [address] Phone: [phone]; Fax: [phone]

## 2018-03-31 ENCOUNTER — Encounter: Payer: Self-pay | Admitting: Internal Medicine

## 2018-04-02 DIAGNOSIS — R82998 Other abnormal findings in urine: Secondary | ICD-10-CM | POA: Diagnosis not present

## 2018-04-02 DIAGNOSIS — E7849 Other hyperlipidemia: Secondary | ICD-10-CM | POA: Diagnosis not present

## 2018-04-02 DIAGNOSIS — E559 Vitamin D deficiency, unspecified: Secondary | ICD-10-CM | POA: Diagnosis not present

## 2018-04-02 DIAGNOSIS — I10 Essential (primary) hypertension: Secondary | ICD-10-CM | POA: Diagnosis not present

## 2018-04-08 DIAGNOSIS — I358 Other nonrheumatic aortic valve disorders: Secondary | ICD-10-CM | POA: Diagnosis not present

## 2018-04-08 DIAGNOSIS — N183 Chronic kidney disease, stage 3 (moderate): Secondary | ICD-10-CM | POA: Diagnosis not present

## 2018-04-08 DIAGNOSIS — I10 Essential (primary) hypertension: Secondary | ICD-10-CM | POA: Diagnosis not present

## 2018-04-08 DIAGNOSIS — E7849 Other hyperlipidemia: Secondary | ICD-10-CM | POA: Diagnosis not present

## 2018-04-08 DIAGNOSIS — Z0000 Encounter for general adult medical examination without abnormal findings: Secondary | ICD-10-CM | POA: Diagnosis not present

## 2018-04-08 DIAGNOSIS — M1990 Unspecified osteoarthritis, unspecified site: Secondary | ICD-10-CM | POA: Diagnosis not present

## 2018-04-08 DIAGNOSIS — I129 Hypertensive chronic kidney disease with stage 1 through stage 4 chronic kidney disease, or unspecified chronic kidney disease: Secondary | ICD-10-CM | POA: Diagnosis not present

## 2018-04-08 DIAGNOSIS — E559 Vitamin D deficiency, unspecified: Secondary | ICD-10-CM | POA: Diagnosis not present

## 2018-04-08 DIAGNOSIS — D692 Other nonthrombocytopenic purpura: Secondary | ICD-10-CM | POA: Diagnosis not present

## 2018-04-15 DIAGNOSIS — Z1212 Encounter for screening for malignant neoplasm of rectum: Secondary | ICD-10-CM | POA: Diagnosis not present

## 2018-06-24 ENCOUNTER — Other Ambulatory Visit: Payer: Self-pay | Admitting: Cardiovascular Disease

## 2018-08-07 DIAGNOSIS — H43813 Vitreous degeneration, bilateral: Secondary | ICD-10-CM | POA: Diagnosis not present

## 2018-08-07 DIAGNOSIS — H04123 Dry eye syndrome of bilateral lacrimal glands: Secondary | ICD-10-CM | POA: Diagnosis not present

## 2018-10-20 DIAGNOSIS — N183 Chronic kidney disease, stage 3 (moderate): Secondary | ICD-10-CM | POA: Diagnosis not present

## 2018-10-20 DIAGNOSIS — E7849 Other hyperlipidemia: Secondary | ICD-10-CM | POA: Diagnosis not present

## 2018-10-20 DIAGNOSIS — D692 Other nonthrombocytopenic purpura: Secondary | ICD-10-CM | POA: Diagnosis not present

## 2018-10-20 DIAGNOSIS — E559 Vitamin D deficiency, unspecified: Secondary | ICD-10-CM | POA: Diagnosis not present

## 2018-10-20 DIAGNOSIS — I129 Hypertensive chronic kidney disease with stage 1 through stage 4 chronic kidney disease, or unspecified chronic kidney disease: Secondary | ICD-10-CM | POA: Diagnosis not present

## 2018-10-20 DIAGNOSIS — Z23 Encounter for immunization: Secondary | ICD-10-CM | POA: Diagnosis not present

## 2018-10-20 DIAGNOSIS — M1990 Unspecified osteoarthritis, unspecified site: Secondary | ICD-10-CM | POA: Diagnosis not present

## 2018-10-20 DIAGNOSIS — I358 Other nonrheumatic aortic valve disorders: Secondary | ICD-10-CM | POA: Diagnosis not present

## 2018-10-20 DIAGNOSIS — I10 Essential (primary) hypertension: Secondary | ICD-10-CM | POA: Diagnosis not present

## 2018-12-27 ENCOUNTER — Other Ambulatory Visit: Payer: Self-pay | Admitting: Cardiovascular Disease

## 2018-12-29 NOTE — Telephone Encounter (Signed)
Rx(s) sent to pharmacy electronically.  

## 2019-01-22 DIAGNOSIS — L821 Other seborrheic keratosis: Secondary | ICD-10-CM | POA: Diagnosis not present

## 2019-01-22 DIAGNOSIS — I781 Nevus, non-neoplastic: Secondary | ICD-10-CM | POA: Diagnosis not present

## 2019-01-22 DIAGNOSIS — L570 Actinic keratosis: Secondary | ICD-10-CM | POA: Diagnosis not present

## 2019-01-22 DIAGNOSIS — L814 Other melanin hyperpigmentation: Secondary | ICD-10-CM | POA: Diagnosis not present

## 2019-01-22 DIAGNOSIS — Z85828 Personal history of other malignant neoplasm of skin: Secondary | ICD-10-CM | POA: Diagnosis not present

## 2019-01-22 DIAGNOSIS — D2260 Melanocytic nevi of unspecified upper limb, including shoulder: Secondary | ICD-10-CM | POA: Diagnosis not present

## 2019-01-22 DIAGNOSIS — D225 Melanocytic nevi of trunk: Secondary | ICD-10-CM | POA: Diagnosis not present

## 2019-01-22 DIAGNOSIS — D2270 Melanocytic nevi of unspecified lower limb, including hip: Secondary | ICD-10-CM | POA: Diagnosis not present

## 2019-03-23 ENCOUNTER — Telehealth: Payer: Self-pay | Admitting: Surgery

## 2019-03-23 NOTE — Telephone Encounter (Signed)
Left patient a message and sent email for patient to call and reschedule their May 14 visit to  July per Dr. C/dc

## 2019-03-26 ENCOUNTER — Ambulatory Visit: Payer: Medicare HMO | Admitting: Cardiovascular Disease

## 2019-04-15 DIAGNOSIS — R82998 Other abnormal findings in urine: Secondary | ICD-10-CM | POA: Diagnosis not present

## 2019-04-15 DIAGNOSIS — E7849 Other hyperlipidemia: Secondary | ICD-10-CM | POA: Diagnosis not present

## 2019-04-15 DIAGNOSIS — E559 Vitamin D deficiency, unspecified: Secondary | ICD-10-CM | POA: Diagnosis not present

## 2019-04-15 DIAGNOSIS — I129 Hypertensive chronic kidney disease with stage 1 through stage 4 chronic kidney disease, or unspecified chronic kidney disease: Secondary | ICD-10-CM | POA: Diagnosis not present

## 2019-04-22 DIAGNOSIS — E785 Hyperlipidemia, unspecified: Secondary | ICD-10-CM | POA: Diagnosis not present

## 2019-04-22 DIAGNOSIS — I129 Hypertensive chronic kidney disease with stage 1 through stage 4 chronic kidney disease, or unspecified chronic kidney disease: Secondary | ICD-10-CM | POA: Diagnosis not present

## 2019-04-22 DIAGNOSIS — M1990 Unspecified osteoarthritis, unspecified site: Secondary | ICD-10-CM | POA: Diagnosis not present

## 2019-04-22 DIAGNOSIS — I358 Other nonrheumatic aortic valve disorders: Secondary | ICD-10-CM | POA: Diagnosis not present

## 2019-04-22 DIAGNOSIS — D692 Other nonthrombocytopenic purpura: Secondary | ICD-10-CM | POA: Diagnosis not present

## 2019-04-22 DIAGNOSIS — Z0000 Encounter for general adult medical examination without abnormal findings: Secondary | ICD-10-CM | POA: Diagnosis not present

## 2019-04-22 DIAGNOSIS — I10 Essential (primary) hypertension: Secondary | ICD-10-CM | POA: Diagnosis not present

## 2019-04-22 DIAGNOSIS — N183 Chronic kidney disease, stage 3 (moderate): Secondary | ICD-10-CM | POA: Diagnosis not present

## 2019-04-22 DIAGNOSIS — E559 Vitamin D deficiency, unspecified: Secondary | ICD-10-CM | POA: Diagnosis not present

## 2019-05-26 ENCOUNTER — Telehealth: Payer: Self-pay | Admitting: Cardiovascular Disease

## 2019-05-26 NOTE — Telephone Encounter (Signed)
I called to confirm her apt for 05-27-19 with Dr Loletha Grayer.       COVID-19 Pre-Screening Questions:   In the past 7 to 10 days have you had a cough,  shortness of breath, headache, congestion, fever (100 or greater) body aches, chills, sore throat, or sudden loss of taste or sense of smell? no  Have you been around anyone with known Covid 19.  Have you been around anyone who is awaiting Covid 19 test results in the past 7 to 10 days? no  Have you been around anyone who has been exposed to Covid 19, or has mentioned symptoms of Covid 19 within the past 7 to 10 days? no  If you have any concerns/questions about symptoms patients report during screening (either on the phone or at threshold). Contact the provider seeing the patient or DOD for further guidance.  If neither are available contact a member of the leadership team.

## 2019-05-27 ENCOUNTER — Encounter: Payer: Self-pay | Admitting: Cardiovascular Disease

## 2019-05-27 ENCOUNTER — Other Ambulatory Visit: Payer: Self-pay

## 2019-05-27 ENCOUNTER — Ambulatory Visit (INDEPENDENT_AMBULATORY_CARE_PROVIDER_SITE_OTHER): Payer: Medicare HMO | Admitting: Cardiovascular Disease

## 2019-05-27 VITALS — BP 131/74 | HR 86 | Temp 96.9°F | Ht 67.5 in | Wt 208.0 lb

## 2019-05-27 DIAGNOSIS — E7800 Pure hypercholesterolemia, unspecified: Secondary | ICD-10-CM | POA: Diagnosis not present

## 2019-05-27 DIAGNOSIS — I350 Nonrheumatic aortic (valve) stenosis: Secondary | ICD-10-CM | POA: Diagnosis not present

## 2019-05-27 DIAGNOSIS — R55 Syncope and collapse: Secondary | ICD-10-CM | POA: Diagnosis not present

## 2019-05-27 DIAGNOSIS — E669 Obesity, unspecified: Secondary | ICD-10-CM

## 2019-05-27 DIAGNOSIS — I480 Paroxysmal atrial fibrillation: Secondary | ICD-10-CM | POA: Diagnosis not present

## 2019-05-27 NOTE — Patient Instructions (Signed)
Medication Instructions:  Your physician recommends that you continue on your current medications as directed. Please refer to the Current Medication list given to you today.  If you need a refill on your cardiac medications before your next appointment, please call your pharmacy.   Lab work: None ordered If you have labs (blood work) drawn today and your tests are completely normal, you will receive your results only by: [place] (if you have MyChart) OR A paper copy in the mail If you have any lab test that is abnormal or we need to change your treatment, we will call you to review the results.  Testing/Procedures: Your physician has requested that you have an echocardiogram in one year. Echocardiography is a painless test that uses sound waves to create images of your heart. It provides your doctor with information about the size and shape of your heart and how well your heart's chambers and valves are working. You may receive an ultrasound enhancing agent through an IV if needed to better visualize your heart during the echo.This procedure takes approximately one hour. There are no restrictions for this procedure. This will take place at the [address].    Follow-Up: At [facility], you and your health needs are our priority.  As part of our continuing mission to provide you with exceptional heart care, we have created designated Provider Care Teams.  These Care Teams include your primary Cardiologist (physician) and Advanced Practice Providers (APPs -  Physician Assistants and Nurse Practitioners) who all work together to provide you with the care you need, when you need it. You will need a follow up appointment in 12 months after the echo.  Please call our office 2 months in advance to schedule this appointment.  You may see  Sanda Klein, MD or one of the following Advanced Practice Providers on your designated Care Team: Almyra Deforest, PA-C Fabian Sharp,  [place]

## 2019-05-27 NOTE — Progress Notes (Signed)
Cardiology Office Note    Date:  05/27/2019   ID:  Traci [name], DOB 09/21/1941, MRN [id number]  PCP:  Burnard Bunting, MD  Cardiologist:   Sanda Klein, MD   Chief Complaint  Patient presents with  . Aortic Stenosis    History of Present Illness:  Traci [name] is a 78 y.o. female with a single episode of vasovagal syncope over a year ago and with mild aortic valve stenosis, hyperlipidemia and hypertension.  She had a single episode of paroxysmal atrial fibrillation in 2012 immediately after a laparoscopic cholecystectomy when she was also hypokalemic.  She has not had any new episodes of syncope. She has not had any problems with palpitations ( in the 7 years since her cholecystectomy).  The patient specifically denies any chest pain at rest exertion, dyspnea at rest or with exertion, orthopnea, paroxysmal nocturnal dyspnea, syncope, palpitations, focal neurological deficits, intermittent claudication, lower extremity edema, unexplained weight gain, cough, hemoptysis or wheezing.  er aortic valve was mildly stenotic by echo in 2016. The mean gradient was around 15 mmHg. The valve area was clearly underestimated by the continuity equation.  Past Medical History:  Diagnosis Date  . GERD (gastroesophageal reflux disease)   . High cholesterol   . Hypertension   . Irregular heart rate     Past Surgical History:  Procedure Laterality Date  . CHOLECYSTECTOMY      Current Medications: Outpatient Medications Prior to Visit  Medication Sig Dispense Refill  . atorvastatin (LIPITOR) 20 MG tablet Take 20 mg by mouth daily.    . cholecalciferol (VITAMIN D) 1000 UNITS tablet Take 1,000 Units by mouth daily.    Marland Kitchen lisinopril (PRINIVIL,ZESTRIL) 20 MG tablet Take 1 tablet (20 mg total) by mouth daily. 90 tablet 0  . pantoprazole (PROTONIX) 40 MG tablet Take 40 mg by mouth daily.       No facility-administered medications prior to visit.      Allergies:   Patient has no known  allergies.   Social History   Socioeconomic History  . Marital status: Widowed    Spouse name: Not on file  . Number of children: Not on file  . Years of education: Not on file  . Highest education level: Not on file  Occupational History  . Not on file  Social Needs  . Financial resource strain: Not on file  . Food insecurity    Worry: Not on file    Inability: Not on file  . Transportation needs    Medical: Not on file    Non-medical: Not on file  Tobacco Use  . Smoking status: Never Smoker  . Smokeless tobacco: Never Used  Substance and Sexual Activity  . Alcohol use: Yes    Comment: OCCASIONALLY  . Drug use: No  . Sexual activity: Not on file  Lifestyle  . Physical activity    Days per week: Not on file    Minutes per session: Not on file  . Stress: Not on file  Relationships  . Social Herbalist on phone: Not on file    Gets together: Not on file    Attends religious service: Not on file    Active member of club or organization: Not on file    Attends meetings of clubs or organizations: Not on file    Relationship status: Not on file  Other Topics Concern  . Not on file  Social History Narrative  . Not on file  Family History:  The patient's family history includes Heart disease in her father.   ROS:   Please see the history of present illness.    ROS pastelike, pressure-like Cardiovascular all other systems reviewed and are negative.   PHYSICAL EXAM:   VS:  BP 131/74   Pulse 86   Temp (!) 96.9 F (36.1 C)   Ht 5' 7.5" (1.715 m)   Wt 208 lb (94.3 kg)   SpO2 95%   BMI 32.10 kg/m     General: Alert, no other a lot of antibiotics on chronic oral diuretics have [facility] trauma x3, no distress, obese Head: no evidence of trauma, PERRL, EOMI, no exophtalmos or lid lag, no myxedema, no xanthelasma; normal ears, nose and oropharynx Neck: normal jugular venous pulsations and no hepatojugular reflux; brisk carotid pulses  without delay and no carotid bruits Chest: clear to auscultation, no signs of consolidation by percussion or palpation, normal fremitus, symmetrical and full respiratory excursions Cardiovascular: normal position and quality of the apical impulse, regular rhythm, normal first and second heart sounds, 2/6 early peaking systolic ejection murmur, no diastolic murmurs, no apical murmurs, rubs or gallops Abdomen: no tenderness or distention, no masses by palpation, no abnormal pulsatility or arterial bruits, normal bowel sounds, no hepatosplenomegaly Extremities: no clubbing, cyanosis or edema; 2+ radial, ulnar and brachial pulses bilaterally; 2+ right femoral, posterior tibial and dorsalis pedis pulses; 2+ left femoral, posterior tibial and dorsalis pedis pulses; no subclavian or femoral bruits Neurological: grossly nonfocal Psych: Normal mood and affect   Wt Readings from Last 3 Encounters:  05/27/19 208 lb (94.3 kg)  02/27/18 214 lb 9.6 oz (97.3 kg)  02/12/17 208 lb (94.3 kg)      Studies/Labs Reviewed:   EKG:  EKG is ordered today.  It shows normal sinus rhythm, normal tracing Recent Labs: 04/15/2019 Total cholesterol 161, HDL 60, LDL 90, triglycerides 57 Hemoglobin 12.5, creatinine 1.0, normal TSH  ASSESSMENT:    1. Vasovagal syncope   2. Paroxysmal atrial fibrillation (HCC)   3. Aortic valve stenosis, nonrheumatic   4. Hypercholesterolemia   5. Mild obesity      PLAN:  In order of problems listed above:  1. Syncope: No recurrence, occasionally mild prodromal symptoms. Encourage her to stay well hydrated and avoid sudden changes in position and known triggers. 2. AFib: Single episode in the postoperative period.  No recurrence in 8 years.  If true atrial fibrillation is again documented she will need full anticoagulation (CHADSVasc 3: age, gender, HTN) 3. AS: Mild by echo in 2016, currently asymptomatic.  She should call if she develops exertional angina, exertional dyspnea,  exertional syncope.  We will plan to repeat an echocardiogram next year. 4. HTN: Well-controlled. 5. HLP: On statin.  Target LDL cholesterol <100.  Good HDL level. 6. Obesity: Recommended continued efforts at weight loss.    Medication Adjustments/Labs and Tests Ordered: Current medicines are reviewed at length with the patient today.  Concerns regarding medicines are outlined above.  Medication changes, Labs and Tests ordered today are listed in the Patient Instructions below. Patient Instructions  Medication Instructions:  Your physician recommends that you continue on your current medications as directed. Please refer to the Current Medication list given to you today.  If you need a refill on your cardiac medications before your next appointment, please call your pharmacy.   Lab work: None ordered If you have labs (blood work) drawn today and your tests are completely normal, you will  receive your results only by: [place] (if you have MyChart) OR A paper copy in the mail If you have any lab test that is abnormal or we need to change your treatment, we will call you to review the results.  Testing/Procedures: Your physician has requested that you have an echocardiogram in one year. Echocardiography is a painless test that uses sound waves to create images of your heart. It provides your doctor with information about the size and shape of your heart and how well your heart's chambers and valves are working. You may receive an ultrasound enhancing agent through an IV if needed to better visualize your heart during the echo.This procedure takes approximately one hour. There are no restrictions for this procedure. This will take place at the [address].    Follow-Up: At [facility], you and your health needs are our priority.  As part of our continuing mission to provide you with exceptional heart care, we have created designated Provider Care Teams.  These Care  Teams include your primary Cardiologist (physician) and Advanced Practice Providers (APPs -  Physician Assistants and Nurse Practitioners) who all work together to provide you with the care you need, when you need it. You will need a follow up appointment in 12 months after the echo.  Please call our office 2 months in advance to schedule this appointment.  You may see  Sanda Klein, MD or one of the following Advanced Practice Providers on your designated Care Team: Almyra Deforest, PA-C Fabian Sharp, [place]          Signed, Sanda Klein, MD  05/27/2019 3:15 PM    [place], [place], [place]  [address] Phone: [phone]; Fax: [phone]

## 2019-06-25 ENCOUNTER — Other Ambulatory Visit: Payer: Self-pay | Admitting: Cardiovascular Disease

## 2019-09-28 ENCOUNTER — Other Ambulatory Visit: Payer: Self-pay | Admitting: Cardiovascular Disease

## 2019-12-17 DIAGNOSIS — D692 Other nonthrombocytopenic purpura: Secondary | ICD-10-CM | POA: Diagnosis not present

## 2019-12-17 DIAGNOSIS — N1831 Chronic kidney disease, stage 3a: Secondary | ICD-10-CM | POA: Diagnosis not present

## 2019-12-17 DIAGNOSIS — M1990 Unspecified osteoarthritis, unspecified site: Secondary | ICD-10-CM | POA: Diagnosis not present

## 2019-12-17 DIAGNOSIS — I129 Hypertensive chronic kidney disease with stage 1 through stage 4 chronic kidney disease, or unspecified chronic kidney disease: Secondary | ICD-10-CM | POA: Diagnosis not present

## 2019-12-17 DIAGNOSIS — Z1331 Encounter for screening for depression: Secondary | ICD-10-CM | POA: Diagnosis not present

## 2020-01-22 DIAGNOSIS — L814 Other melanin hyperpigmentation: Secondary | ICD-10-CM | POA: Diagnosis not present

## 2020-01-22 DIAGNOSIS — L821 Other seborrheic keratosis: Secondary | ICD-10-CM | POA: Diagnosis not present

## 2020-01-22 DIAGNOSIS — L570 Actinic keratosis: Secondary | ICD-10-CM | POA: Diagnosis not present

## 2020-01-22 DIAGNOSIS — D1801 Hemangioma of skin and subcutaneous tissue: Secondary | ICD-10-CM | POA: Diagnosis not present

## 2020-01-22 DIAGNOSIS — Z85828 Personal history of other malignant neoplasm of skin: Secondary | ICD-10-CM | POA: Diagnosis not present

## 2020-01-22 DIAGNOSIS — L853 Xerosis cutis: Secondary | ICD-10-CM | POA: Diagnosis not present

## 2020-03-28 ENCOUNTER — Other Ambulatory Visit: Payer: Self-pay | Admitting: Cardiovascular Disease

## 2020-04-18 DIAGNOSIS — E7849 Other hyperlipidemia: Secondary | ICD-10-CM | POA: Diagnosis not present

## 2020-04-18 DIAGNOSIS — Z0000 Encounter for general adult medical examination without abnormal findings: Secondary | ICD-10-CM | POA: Diagnosis not present

## 2020-04-18 DIAGNOSIS — E559 Vitamin D deficiency, unspecified: Secondary | ICD-10-CM | POA: Diagnosis not present

## 2020-04-25 DIAGNOSIS — Z0000 Encounter for general adult medical examination without abnormal findings: Secondary | ICD-10-CM | POA: Diagnosis not present

## 2020-04-25 DIAGNOSIS — S90211D Contusion of right great toe with damage to nail, subsequent encounter: Secondary | ICD-10-CM | POA: Diagnosis not present

## 2020-04-25 DIAGNOSIS — D692 Other nonthrombocytopenic purpura: Secondary | ICD-10-CM | POA: Diagnosis not present

## 2020-04-25 DIAGNOSIS — M1990 Unspecified osteoarthritis, unspecified site: Secondary | ICD-10-CM | POA: Diagnosis not present

## 2020-04-25 DIAGNOSIS — M2041 Other hammer toe(s) (acquired), right foot: Secondary | ICD-10-CM | POA: Diagnosis not present

## 2020-04-25 DIAGNOSIS — E7849 Other hyperlipidemia: Secondary | ICD-10-CM | POA: Diagnosis not present

## 2020-04-25 DIAGNOSIS — N183 Chronic kidney disease, stage 3 unspecified: Secondary | ICD-10-CM | POA: Diagnosis not present

## 2020-04-25 DIAGNOSIS — L03031 Cellulitis of right toe: Secondary | ICD-10-CM | POA: Diagnosis not present

## 2020-04-25 DIAGNOSIS — R82998 Other abnormal findings in urine: Secondary | ICD-10-CM | POA: Diagnosis not present

## 2020-04-25 DIAGNOSIS — L02611 Cutaneous abscess of right foot: Secondary | ICD-10-CM | POA: Diagnosis not present

## 2020-04-25 DIAGNOSIS — E559 Vitamin D deficiency, unspecified: Secondary | ICD-10-CM | POA: Diagnosis not present

## 2020-04-25 DIAGNOSIS — I129 Hypertensive chronic kidney disease with stage 1 through stage 4 chronic kidney disease, or unspecified chronic kidney disease: Secondary | ICD-10-CM | POA: Diagnosis not present

## 2020-04-25 DIAGNOSIS — I358 Other nonrheumatic aortic valve disorders: Secondary | ICD-10-CM | POA: Diagnosis not present

## 2020-04-25 DIAGNOSIS — B351 Tinea unguium: Secondary | ICD-10-CM | POA: Diagnosis not present

## 2020-04-25 DIAGNOSIS — I350 Nonrheumatic aortic (valve) stenosis: Secondary | ICD-10-CM | POA: Diagnosis not present

## 2020-04-28 DIAGNOSIS — K921 Melena: Secondary | ICD-10-CM | POA: Diagnosis not present

## 2020-05-11 DIAGNOSIS — L02611 Cutaneous abscess of right foot: Secondary | ICD-10-CM | POA: Diagnosis not present

## 2020-05-11 DIAGNOSIS — M2041 Other hammer toe(s) (acquired), right foot: Secondary | ICD-10-CM | POA: Diagnosis not present

## 2020-05-11 DIAGNOSIS — B351 Tinea unguium: Secondary | ICD-10-CM | POA: Diagnosis not present

## 2020-05-11 DIAGNOSIS — S90211D Contusion of right great toe with damage to nail, subsequent encounter: Secondary | ICD-10-CM | POA: Diagnosis not present

## 2020-05-11 DIAGNOSIS — L03031 Cellulitis of right toe: Secondary | ICD-10-CM | POA: Diagnosis not present

## 2020-05-13 DIAGNOSIS — Z1212 Encounter for screening for malignant neoplasm of rectum: Secondary | ICD-10-CM | POA: Diagnosis not present

## 2020-05-25 ENCOUNTER — Other Ambulatory Visit: Payer: Self-pay

## 2020-05-25 ENCOUNTER — Ambulatory Visit (HOSPITAL_COMMUNITY): Payer: Medicare HMO | Attending: Cardiology

## 2020-05-25 DIAGNOSIS — I350 Nonrheumatic aortic (valve) stenosis: Secondary | ICD-10-CM | POA: Insufficient documentation

## 2020-05-31 ENCOUNTER — Other Ambulatory Visit: Payer: Self-pay

## 2020-05-31 ENCOUNTER — Encounter: Payer: Self-pay | Admitting: Cardiovascular Disease

## 2020-05-31 ENCOUNTER — Ambulatory Visit: Payer: Medicare HMO | Admitting: Cardiovascular Disease

## 2020-05-31 VITALS — BP 140/78 | HR 76 | Ht 67.0 in | Wt 215.6 lb

## 2020-05-31 DIAGNOSIS — I480 Paroxysmal atrial fibrillation: Secondary | ICD-10-CM | POA: Diagnosis not present

## 2020-05-31 DIAGNOSIS — R079 Chest pain, unspecified: Secondary | ICD-10-CM

## 2020-05-31 DIAGNOSIS — I10 Essential (primary) hypertension: Secondary | ICD-10-CM | POA: Diagnosis not present

## 2020-05-31 DIAGNOSIS — E669 Obesity, unspecified: Secondary | ICD-10-CM

## 2020-05-31 DIAGNOSIS — E7800 Pure hypercholesterolemia, unspecified: Secondary | ICD-10-CM

## 2020-05-31 DIAGNOSIS — R55 Syncope and collapse: Secondary | ICD-10-CM

## 2020-05-31 DIAGNOSIS — I350 Nonrheumatic aortic (valve) stenosis: Secondary | ICD-10-CM

## 2020-05-31 NOTE — Progress Notes (Signed)
Cardiology Office Note    Date:  06/01/2020   ID:  Traci [name], DOB Mar 03, 1941, MRN [id number]  PCP:  Burnard Bunting, MD  Cardiologist:   Sanda Klein, MD   Chief Complaint  Patient presents with  . Aortic Stenosis    History of Present Illness:  Traci [name] is a 79 y.o. female with a single episode of vasovagal syncope over a year ago and mild aortic valve stenosis, hyperlipidemia and hypertension.  She had a single episode of paroxysmal atrial fibrillation in 2012 immediately after a laparoscopic cholecystectomy when she was also hypokalemic.  She does not have palpitations.  In the 9 years that have passed since her cholecystectomy she has not had recurrent documented atrial fibrillation.  She has occasional chest discomfort when performing greater than usual physical activity such as mowing the lawn with a push mower.  She has no shortness of breath or chest discomfort climbing the stairs in her house or performing housework.  She denies dizziness or syncope either at rest or with activity.  She has not had leg edema, intermittent claudication or focal neurological events.  Her most recent lipid profile showed an LDL cholesterol of 82.  She does not have diabetes mellitus.  Her blood pressure is usually in the 120s-low 130s/70s.  Her follow-up echocardiogram shows slight progression of the aortic stenosis which is now probably moderate (mean gradient 19, peak velocity 2.9 m/s, DOI 0.35).  Left ventricular systolic function remains excellent.  Her diastolic function was interpreted as showing pseudonormalization, but her annular diastolic velocity is actually not bad at 7.5 (medial) and 8 cm/s (lateral) respectively.  Global longitudinal strain is excellent at -24%.  Past Medical History:  Diagnosis Date  . GERD (gastroesophageal reflux disease)   . High cholesterol   . Hypertension   . Irregular heart rate     Past Surgical History:  Procedure Laterality Date  .  CHOLECYSTECTOMY      Current Medications: Outpatient Medications Prior to Visit  Medication Sig Dispense Refill  . atorvastatin (LIPITOR) 20 MG tablet Take 20 mg by mouth daily.    . cholecalciferol (VITAMIN D) 1000 UNITS tablet Take 1,000 Units by mouth daily.    Marland Kitchen lisinopril (ZESTRIL) 20 MG tablet TAKE 1 TABLET BY MOUTH EVERY DAY 90 tablet 0  . pantoprazole (PROTONIX) 40 MG tablet Take 40 mg by mouth daily.       No facility-administered medications prior to visit.     Allergies:   Patient has no known allergies.   Social History   Socioeconomic History  . Marital status: Widowed    Spouse name: Not on file  . Number of children: Not on file  . Years of education: Not on file  . Highest education level: Not on file  Occupational History  . Not on file  Tobacco Use  . Smoking status: Never Smoker  . Smokeless tobacco: Never Used  Substance and Sexual Activity  . Alcohol use: Yes    Comment: OCCASIONALLY  . Drug use: No  . Sexual activity: Not on file  Other Topics Concern  . Not on file  Social History Narrative  . Not on file   Social Determinants of Health   Financial Resource Strain:   . Difficulty of Paying Living Expenses:   Food Insecurity:   . Worried About Charity fundraiser in the Last Year:   . Arboriculturist in the Last Year:   Transportation Needs:   .  Lack of Transportation (Medical):   Marland Kitchen Lack of Transportation (Non-Medical):   Physical Activity:   . Days of Exercise per Week:   . Minutes of Exercise per Session:   Stress:   . Feeling of Stress :   Social Connections:   . Frequency of Communication with Friends and Family:   . Frequency of Social Gatherings with Friends and Family:   . Attends Religious Services:   . Active Member of Clubs or Organizations:   . Attends Archivist Meetings:   Marland Kitchen Marital Status:      Family History:  The patient's family history includes Heart disease in her father.   ROS:   Please see the  history of present illness.    ROS  All other systems are reviewed and are negative.   PHYSICAL EXAM:   VS:  BP 140/78   Pulse 76   Ht 5\' 7"  (1.702 m)   Wt 215 lb 9.6 oz (97.8 kg)   SpO2 96%   BMI 33.77 kg/m      General: Alert, oriented x3, no distress, moderately obese Head: no evidence of trauma, PERRL, EOMI, no exophtalmos or lid lag, no myxedema, no xanthelasma; normal ears, nose and oropharynx Neck: normal jugular venous pulsations and no hepatojugular reflux; brisk carotid pulses without delay and no carotid bruits Chest: clear to auscultation, no signs of consolidation by percussion or palpation, normal fremitus, symmetrical and full respiratory excursions Cardiovascular: normal position and quality of the apical impulse, regular rhythm, normal first and second heart sounds, 3/6 fairly musical early peaking systolic ejection murmur in the aortic focus, no diastolic murmurs, rubs or gallops Abdomen: no tenderness or distention, no masses by palpation, no abnormal pulsatility or arterial bruits, normal bowel sounds, no hepatosplenomegaly Extremities: no clubbing, cyanosis or edema; 2+ radial, ulnar and brachial pulses bilaterally; 2+ right femoral, posterior tibial and dorsalis pedis pulses; 2+ left femoral, posterior tibial and dorsalis pedis pulses; no subclavian or femoral bruits Neurological: grossly nonfocal Psych: Normal mood and affect   Wt Readings from Last 3 Encounters:  05/31/20 215 lb 9.6 oz (97.8 kg)  05/27/19 208 lb (94.3 kg)  02/27/18 214 lb 9.6 oz (97.3 kg)      Studies/Labs Reviewed:   EKG:  EKG is ordered today.  Normal sinus rhythm, completely normal tracing.  QTc 424 ms Recent Labs: 04/15/2019 Total cholesterol 161, HDL 60, LDL 90, triglycerides 57 Hemoglobin 12.5, creatinine 1.0, normal TSH  ASSESSMENT:    1. Exertional chest pain   2. Vasovagal syncope   3. Paroxysmal atrial fibrillation (HCC)   4. Aortic valve stenosis, nonrheumatic   5.  Essential hypertension   6. Hypercholesterolemia   7. Mild obesity      PLAN:  In order of problems listed above:  1. Exertional chest pain: It seems she may be describing stable angina pectoris, but this is mild and really does not interfere with daily activities.  It seems to be occurring in a very stereotypical pattern only when she mows the lawn.  We will schedule for a plain treadmill stress test.  If significant ECG changes are seen, I would recommend cardiac catheterization.  Otherwise I would simply recommend continued medical therapy.  We could add a low-dose of a beta-blocker.   2. Syncope: She is prone to infrequent vasovagal events.  She has not had any recent recurrence, occasionally mild prodromal symptoms. Encourage her to stay well hydrated and avoid sudden changes in position and known triggers. 3. AFib:  Single episode in the postoperative period.  No recurrence in almost 10 years.  If true atrial fibrillation is again documented she will need full anticoagulation (CHADSVasc 3: age, gender, HTN) 4. AS: Mild by echo in 2016, currently moderate.  She should call if she develops worsening exertional angina, exertional dyspnea, exertional syncope.  We will plan to repeat an echocardiogram next year.  Discussed the possible need for TAVR or SAVR in a few years. 5. HTN: Fairly well controlled 6. HLP: On statin.  Target LDL cholesterol <100 but if identified CAD will shoot for an LDL less than 70.  Good HDL level. 7. Obesity: Recommended continued efforts at weight loss.  When it comes time for either TAVR or surgical AVR being leaner will serve her well during rehabilitation.    Medication Adjustments/Labs and Tests Ordered: Current medicines are reviewed at length with the patient today.  Concerns regarding medicines are outlined above.  Medication changes, Labs and Tests ordered today are listed in the Patient Instructions below. Patient Instructions  Medication Instructions:  No  changes *If you need a refill on your cardiac medications before your next appointment, please call your pharmacy*   Lab Work: None ordered If you have labs (blood work) drawn today and your tests are completely normal, you will receive your results only by: Marland Kitchen MyChart Message (if you have MyChart) OR . A paper copy in the mail If you have any lab test that is abnormal or we need to change your treatment, we will call you to review the results.   Testing/Procedures: Your physician has requested that you have an exercise tolerance test. For further information please visit [email or web address]. Please also follow instruction sheet, as given. This will take place at [place], [address].  Do not drink or eat foods with caffeine for 24 hours before the test. (Chocolate, coffee, tea, or energy drinks)  If you use an inhaler, bring it with you to the test.  Do not smoke for 4 hours before the test.  Wear comfortable shoes and clothing.    Follow-Up: At [facility], you and your health needs are our priority.  As part of our continuing mission to provide you with exceptional heart care, we have created designated Provider Care Teams.  These Care Teams include your primary Cardiologist (physician) and Advanced Practice Providers (APPs -  Physician Assistants and Nurse Practitioners) who all work together to provide you with the care you need, when you need it.  We recommend signing up for the patient portal called "MyChart".  Sign up information is provided on this After Visit Summary.  MyChart is used to connect with patients for Virtual Visits (Telemedicine).  Patients are able to view lab/test results, encounter notes, upcoming appointments, etc.  Non-urgent messages can be sent to your provider as well.   To learn more about what you can do with MyChart, go to [email or web address].    Your next appointment:   12 month(s)  The format for your next appointment:   In  Person  Provider:   You may see Sanda Klein, MD or one of the following Advanced Practice Providers on your designated Care Team:    Almyra Deforest, PA-C  Fabian Sharp, [place] or   Roby Lofts, PA-C       Signed, Sanda Klein, MD  06/01/2020 3:22 PM    [facility] [place], [place], [place]  [address] Phone: [phone]; Fax: [phone]

## 2020-05-31 NOTE — Patient Instructions (Signed)
Medication Instructions:  No changes *If you need a refill on your cardiac medications before your next appointment, please call your pharmacy*   Lab Work: None ordered If you have labs (blood work) drawn today and your tests are completely normal, you will receive your results only by: Marland Kitchen MyChart Message (if you have MyChart) OR . A paper copy in the mail If you have any lab test that is abnormal or we need to change your treatment, we will call you to review the results.   Testing/Procedures: Your physician has requested that you have an exercise tolerance test. For further information please visit [email or web address]. Please also follow instruction sheet, as given. This will take place at [place], [address].  Do not drink or eat foods with caffeine for 24 hours before the test. (Chocolate, coffee, tea, or energy drinks)  If you use an inhaler, bring it with you to the test.  Do not smoke for 4 hours before the test.  Wear comfortable shoes and clothing.    Follow-Up: At [facility], you and your health needs are our priority.  As part of our continuing mission to provide you with exceptional heart care, we have created designated Provider Care Teams.  These Care Teams include your primary Cardiologist (physician) and Advanced Practice Providers (APPs -  Physician Assistants and Nurse Practitioners) who all work together to provide you with the care you need, when you need it.  We recommend signing up for the patient portal called "MyChart".  Sign up information is provided on this After Visit Summary.  MyChart is used to connect with patients for Virtual Visits (Telemedicine).  Patients are able to view lab/test results, encounter notes, upcoming appointments, etc.  Non-urgent messages can be sent to your provider as well.   To learn more about what you can do with MyChart, go to [email or web address].    Your next appointment:   12 month(s)  The format for your  next appointment:   In Person  Provider:   You may see Sanda Klein, MD or one of the following Advanced Practice Providers on your designated Care Team:    Almyra Deforest, PA-C  Fabian Sharp, PA-C or   Roby Lofts, [place]

## 2020-06-01 ENCOUNTER — Encounter: Payer: Self-pay | Admitting: Cardiovascular Disease

## 2020-06-15 ENCOUNTER — Telehealth (HOSPITAL_COMMUNITY): Payer: Self-pay | Admitting: Cardiovascular Disease

## 2020-06-15 NOTE — Telephone Encounter (Signed)
Patient cancelled GXT for 06/16/2020. I called patient to reschedule and informed her that she would have to get a COVID test and remain in Quarantine until appt and she declined to do so. She states that she had the shot and will just wait to schedule when NOT mandatory to have COVID testing. I will defer test for 60 days with fingers crossed.

## 2020-06-15 NOTE — Telephone Encounter (Signed)
Let's not hold our breaths for COVID testing to be rescinded... OK to delay the treadmill test unless she starts having chest pain at rest or with lower levels of activity (currently only happens with mowing lawn)

## 2020-06-16 ENCOUNTER — Ambulatory Visit (HOSPITAL_COMMUNITY): Admission: RE | Admit: 2020-06-16 | Payer: Medicare HMO | Source: Ambulatory Visit

## 2020-06-28 ENCOUNTER — Other Ambulatory Visit: Payer: Self-pay | Admitting: Cardiovascular Disease

## 2020-08-10 DIAGNOSIS — D2372 Other benign neoplasm of skin of left lower limb, including hip: Secondary | ICD-10-CM | POA: Diagnosis not present

## 2020-08-10 DIAGNOSIS — M2041 Other hammer toe(s) (acquired), right foot: Secondary | ICD-10-CM | POA: Diagnosis not present

## 2020-08-10 DIAGNOSIS — B351 Tinea unguium: Secondary | ICD-10-CM | POA: Diagnosis not present

## 2020-08-10 DIAGNOSIS — D2371 Other benign neoplasm of skin of right lower limb, including hip: Secondary | ICD-10-CM | POA: Diagnosis not present

## 2020-10-24 DIAGNOSIS — I129 Hypertensive chronic kidney disease with stage 1 through stage 4 chronic kidney disease, or unspecified chronic kidney disease: Secondary | ICD-10-CM | POA: Diagnosis not present

## 2020-10-24 DIAGNOSIS — I350 Nonrheumatic aortic (valve) stenosis: Secondary | ICD-10-CM | POA: Diagnosis not present

## 2020-10-24 DIAGNOSIS — Z23 Encounter for immunization: Secondary | ICD-10-CM | POA: Diagnosis not present

## 2020-11-25 DIAGNOSIS — D2371 Other benign neoplasm of skin of right lower limb, including hip: Secondary | ICD-10-CM | POA: Diagnosis not present

## 2020-11-25 DIAGNOSIS — M2041 Other hammer toe(s) (acquired), right foot: Secondary | ICD-10-CM | POA: Diagnosis not present

## 2020-11-25 DIAGNOSIS — D2372 Other benign neoplasm of skin of left lower limb, including hip: Secondary | ICD-10-CM | POA: Diagnosis not present

## 2020-11-25 DIAGNOSIS — B351 Tinea unguium: Secondary | ICD-10-CM | POA: Diagnosis not present

## 2021-04-26 DIAGNOSIS — E559 Vitamin D deficiency, unspecified: Secondary | ICD-10-CM | POA: Diagnosis not present

## 2021-04-26 DIAGNOSIS — E785 Hyperlipidemia, unspecified: Secondary | ICD-10-CM | POA: Diagnosis not present

## 2021-04-26 DIAGNOSIS — Z0000 Encounter for general adult medical examination without abnormal findings: Secondary | ICD-10-CM | POA: Diagnosis not present

## 2021-05-03 DIAGNOSIS — Z0000 Encounter for general adult medical examination without abnormal findings: Secondary | ICD-10-CM | POA: Diagnosis not present

## 2021-05-03 DIAGNOSIS — I129 Hypertensive chronic kidney disease with stage 1 through stage 4 chronic kidney disease, or unspecified chronic kidney disease: Secondary | ICD-10-CM | POA: Diagnosis not present

## 2021-05-03 DIAGNOSIS — Z1339 Encounter for screening examination for other mental health and behavioral disorders: Secondary | ICD-10-CM | POA: Diagnosis not present

## 2021-05-03 DIAGNOSIS — D692 Other nonthrombocytopenic purpura: Secondary | ICD-10-CM | POA: Diagnosis not present

## 2021-05-03 DIAGNOSIS — E559 Vitamin D deficiency, unspecified: Secondary | ICD-10-CM | POA: Diagnosis not present

## 2021-05-03 DIAGNOSIS — Z1331 Encounter for screening for depression: Secondary | ICD-10-CM | POA: Diagnosis not present

## 2021-05-03 DIAGNOSIS — Z1212 Encounter for screening for malignant neoplasm of rectum: Secondary | ICD-10-CM | POA: Diagnosis not present

## 2021-05-03 DIAGNOSIS — N1831 Chronic kidney disease, stage 3a: Secondary | ICD-10-CM | POA: Diagnosis not present

## 2021-05-03 DIAGNOSIS — R82998 Other abnormal findings in urine: Secondary | ICD-10-CM | POA: Diagnosis not present

## 2021-05-03 DIAGNOSIS — E669 Obesity, unspecified: Secondary | ICD-10-CM | POA: Diagnosis not present

## 2021-05-03 DIAGNOSIS — E785 Hyperlipidemia, unspecified: Secondary | ICD-10-CM | POA: Diagnosis not present

## 2021-06-21 ENCOUNTER — Other Ambulatory Visit: Payer: Self-pay | Admitting: Cardiovascular Disease

## 2021-06-30 ENCOUNTER — Ambulatory Visit: Payer: Medicare HMO | Admitting: Cardiovascular Disease

## 2021-06-30 ENCOUNTER — Other Ambulatory Visit: Payer: Self-pay

## 2021-06-30 ENCOUNTER — Encounter: Payer: Self-pay | Admitting: Cardiovascular Disease

## 2021-06-30 VITALS — BP 138/82 | HR 67 | Resp 20 | Ht 67.0 in | Wt 214.0 lb

## 2021-06-30 DIAGNOSIS — E669 Obesity, unspecified: Secondary | ICD-10-CM

## 2021-06-30 DIAGNOSIS — I480 Paroxysmal atrial fibrillation: Secondary | ICD-10-CM

## 2021-06-30 DIAGNOSIS — I10 Essential (primary) hypertension: Secondary | ICD-10-CM

## 2021-06-30 DIAGNOSIS — R55 Syncope and collapse: Secondary | ICD-10-CM | POA: Diagnosis not present

## 2021-06-30 DIAGNOSIS — E7800 Pure hypercholesterolemia, unspecified: Secondary | ICD-10-CM | POA: Diagnosis not present

## 2021-06-30 DIAGNOSIS — I350 Nonrheumatic aortic (valve) stenosis: Secondary | ICD-10-CM

## 2021-06-30 NOTE — Patient Instructions (Signed)
Medication Instructions:  No changes *If you need a refill on your cardiac medications before your next appointment, please call your pharmacy*   Lab Work: None ordered If you have labs (blood work) drawn today and your tests are completely normal, you will receive your results only by: [place] (if you have MyChart) OR A paper copy in the mail If you have any lab test that is abnormal or we need to change your treatment, we will call you to review the results.   Testing/Procedures: Your physician has requested that you have an echocardiogram in one year. Echocardiography is a painless test that uses sound waves to create images of your heart. It provides your doctor with information about the size and shape of your heart and how well your heart's chambers and valves are working. You may receive an ultrasound enhancing agent through an IV if needed to better visualize your heart during the echo.This procedure takes approximately one hour. There are no restrictions for this procedure. This will take place at the [address], [address].     Follow-Up: At [facility], you and your health needs are our priority.  As part of our continuing mission to provide you with exceptional heart care, we have created designated Provider Care Teams.  These Care Teams include your primary Cardiologist (physician) and Advanced Practice Providers (APPs -  Physician Assistants and Nurse Practitioners) who all work together to provide you with the care you need, when you need it.  We recommend signing up for the patient portal called "MyChart".  Sign up information is provided on this After Visit Summary.  MyChart is used to connect with patients for Virtual Visits (Telemedicine).  Patients are able to view lab/test results, encounter notes, upcoming appointments, etc.  Non-urgent messages can be sent to your provider as well.   To learn more about what you can do with MyChart, go to  [email or web address].    Your next appointment:   12 month(s)  The format for your next appointment:   In Person  Provider:   You may see Dr. Sallyanne Kuster or one of the following Advanced Practice Providers on your designated Care Team:   Almyra Deforest, PA-C Fabian Sharp, PA-C or  Roby Lofts, [place]

## 2021-06-30 NOTE — Progress Notes (Signed)
Cardiology Office Note    Date:  06/30/2021   ID:  Traci [name], DOB Jan 15, 1941, MRN [id number]  PCP:  Burnard Bunting, MD  Cardiologist:   Sanda Klein, MD   Chief Complaint  Patient presents with   Cardiac Valve Problem    History of Present Illness:  Traci [name] is a 80 y.o. female with a single episode of vasovagal syncope over a year ago and mild aortic valve stenosis, hyperlipidemia and hypertension.  She had a single episode of paroxysmal atrial fibrillation in 2012 immediately after a laparoscopic cholecystectomy when she was also hypokalemic (no clinical recurrence in 10 years).  She finds that it takes her a little longer than usual household chores, but denies frank angina or dyspnea on activity and she has not experienced syncope.  She has mild chronic leg edema, especially after a long ride in the car.  She does have varicose veins does not have claudication or focal neurologic complaints.  She does not have orthopnea or PND.  Last year her echocardiogram showed progression of aortic stenosis to moderate range (mean gradient 19, peak velocity 2.9 m/second, dimensionless index 0.35.  She has excellent left ventricular systolic function). Her diastolic function was interpreted as showing pseudonormalization, but her annular diastolic velocity is actually not bad at 7.5 (medial) and 8 cm/s (lateral) respectively.  Global longitudinal strain is excellent at -24%.  Past Medical History:  Diagnosis Date   GERD (gastroesophageal reflux disease)    High cholesterol    Hypertension    Irregular heart rate     Past Surgical History:  Procedure Laterality Date   CHOLECYSTECTOMY      Current Medications: Outpatient Medications Prior to Visit  Medication Sig Dispense Refill   aspirin EC 81 MG tablet Take 81 mg by mouth daily. Swallow whole.     atorvastatin (LIPITOR) 20 MG tablet Take 20 mg by mouth daily.     cholecalciferol (VITAMIN D) 1000 UNITS tablet Take 1,000  Units by mouth daily.     lisinopril (ZESTRIL) 20 MG tablet TAKE 1 TABLET BY MOUTH EVERY DAY 90 tablet 1   pantoprazole (PROTONIX) 40 MG tablet Take 40 mg by mouth daily.       No facility-administered medications prior to visit.     Allergies:   Patient has no known allergies.   Social History   Socioeconomic History   Marital status: Widowed    Spouse name: Not on file   Number of children: Not on file   Years of education: Not on file   Highest education level: Not on file  Occupational History   Not on file  Tobacco Use   Smoking status: Never   Smokeless tobacco: Never  Substance and Sexual Activity   Alcohol use: Yes    Comment: OCCASIONALLY   Drug use: No   Sexual activity: Not on file  Other Topics Concern   Not on file  Social History Narrative   Not on file   Social Determinants of Health   Financial Resource Strain: Not on file  Food Insecurity: Not on file  Transportation Needs: Not on file  Physical Activity: Not on file  Stress: Not on file  Social Connections: Not on file     Family History:  The patient's family history includes Heart disease in her father.   ROS:   Please see the history of present illness.    ROS  All other systems are reviewed and are negative.   PHYSICAL  EXAM:   VS:  BP 138/82   Pulse 67   Resp 20   Ht '5\' 7"'$  (1.702 m)   Wt 214 lb (97.1 kg)   SpO2 96%   BMI 33.52 kg/m      General: Alert, oriented x3, no distress, mildly obese Head: no evidence of trauma, PERRL, EOMI, no exophtalmos or lid lag, no myxedema, no xanthelasma; normal ears, nose and oropharynx Neck: normal jugular venous pulsations and no hepatojugular reflux; brisk carotid pulses without delay and with bilateral bruits radiating from the chest Chest: clear to auscultation, no signs of consolidation by percussion or palpation, normal fremitus, symmetrical and full respiratory excursions Cardiovascular: normal position and quality of the apical impulse,  regular rhythm, normal first and second heart sounds, 3/6 systolic ejection murmur is a little harsher than last year and is now mid peaking, but the second heart sound is still distinct no diastolic murmurs, rubs or gallops Abdomen: no tenderness or distention, no masses by palpation, no abnormal pulsatility or arterial bruits, normal bowel sounds, no hepatosplenomegaly Extremities: no clubbing, cyanosis or edema; 2+ radial, ulnar and brachial pulses bilaterally; 2+ right femoral, posterior tibial and dorsalis pedis pulses; 2+ left femoral, posterior tibial and dorsalis pedis pulses; no subclavian or femoral bruits Neurological: grossly nonfocal Psych: Normal mood and affect   Wt Readings from Last 3 Encounters:  06/30/21 214 lb (97.1 kg)  05/31/20 215 lb 9.6 oz (97.8 kg)  05/27/19 208 lb (94.3 kg)      Studies/Labs Reviewed:   EKG:  EKG is ordered today.  Normal sinus rhythm, slightly delayed R wave progression, otherwise normal tracing. Recent Labs: 04/15/2019 Total cholesterol 161, HDL 60, LDL 90, triglycerides 57 Hemoglobin 12.5, creatinine 1.0, normal TSH  04/26/2021 Cholesterol 173, HDL 75, LDL 81, triglycerides 87   ASSESSMENT:    1. Aortic valve stenosis, nonrheumatic   2. Vasovagal syncope   3. Paroxysmal atrial fibrillation (HCC)   4. Essential hypertension   5. Hypercholesterolemia   6. Mild obesity       PLAN:  In order of problems listed above:  Aortic stenosis: Murmur suggest disease progression, but she remains essentially asymptomatic. We will schedule for another echocardiogram in a year,, but I asked her to get in touch with [place] sooner if she develops exertional angina/dyspnea/syncope.  Discussed the fact that she is probably not be a good candidate for TAVR and that the work-up includes a CT angiogram and cardiac catheterization that can change the plans.  Even if she needs surgical AVR, I think she would be a good candidate for this despite her  age. Syncope: She is prone to infrequent vasovagal events.  None over the last year reminded her to stay well-hydrated and avoid sudden changes in position. AFib: Had a single event 10 years ago in the perioperative period during hypokalemia.  If true atrial fibrillation is again documented she will need full anticoagulation (CHADSVasc 3: age, gender, HTN) HTN: Usually lower at home in the 120s.  No change in medications. HLP: LDL is acceptable and HDL is excellent.  Continue statin. Obesity: Losing some weight would make for low risk of complications and a quicker recovery after either surgical or transarterial valve replacement.    Medication Adjustments/Labs and Tests Ordered: Current medicines are reviewed at length with the patient today.  Concerns regarding medicines are outlined above.  Medication changes, Labs and Tests ordered today are listed in the Patient Instructions below. Patient Instructions  Medication Instructions:  No changes *If  you need a refill on your cardiac medications before your next appointment, please call your pharmacy*   Lab Work: None ordered If you have labs (blood work) drawn today and your tests are completely normal, you will receive your results only by: [place] (if you have MyChart) OR A paper copy in the mail If you have any lab test that is abnormal or we need to change your treatment, we will call you to review the results.   Testing/Procedures: Your physician has requested that you have an echocardiogram in one year. Echocardiography is a painless test that uses sound waves to create images of your heart. It provides your doctor with information about the size and shape of your heart and how well your heart's chambers and valves are working. You may receive an ultrasound enhancing agent through an IV if needed to better visualize your heart during the echo.This procedure takes approximately one hour. There are no restrictions for this  procedure. This will take place at the [address].     Follow-Up: At [facility], you and your health needs are our priority.  As part of our continuing mission to provide you with exceptional heart care, we have created designated Provider Care Teams.  These Care Teams include your primary Cardiologist (physician) and Advanced Practice Providers (APPs -  Physician Assistants and Nurse Practitioners) who all work together to provide you with the care you need, when you need it.  We recommend signing up for the patient portal called "MyChart".  Sign up information is provided on this After Visit Summary.  MyChart is used to connect with patients for Virtual Visits (Telemedicine).  Patients are able to view lab/test results, encounter notes, upcoming appointments, etc.  Non-urgent messages can be sent to your provider as well.   To learn more about what you can do with MyChart, go to [email or web address].    Your next appointment:   12 month(s)  The format for your next appointment:   In Person  Provider:   You may see Dr. Sallyanne Kuster or one of the following Advanced Practice Providers on your designated Care Team:   Almyra Deforest, PA-C Fabian Sharp, [place] or  Roby Lofts, PA-C    Signed, Sanda Klein, MD  06/30/2021 8:49 AM    [address], [place],   [address] Phone: [phone]; Fax: [phone]

## 2021-11-27 DIAGNOSIS — N1831 Chronic kidney disease, stage 3a: Secondary | ICD-10-CM | POA: Diagnosis not present

## 2021-11-27 DIAGNOSIS — D692 Other nonthrombocytopenic purpura: Secondary | ICD-10-CM | POA: Diagnosis not present

## 2021-11-27 DIAGNOSIS — E559 Vitamin D deficiency, unspecified: Secondary | ICD-10-CM | POA: Diagnosis not present

## 2021-11-27 DIAGNOSIS — I129 Hypertensive chronic kidney disease with stage 1 through stage 4 chronic kidney disease, or unspecified chronic kidney disease: Secondary | ICD-10-CM | POA: Diagnosis not present

## 2021-11-27 DIAGNOSIS — I350 Nonrheumatic aortic (valve) stenosis: Secondary | ICD-10-CM | POA: Diagnosis not present

## 2021-12-22 ENCOUNTER — Other Ambulatory Visit: Payer: Self-pay | Admitting: Cardiovascular Disease

## 2022-01-22 ENCOUNTER — Telehealth: Payer: Self-pay | Admitting: Cardiovascular Disease

## 2022-01-22 NOTE — Telephone Encounter (Signed)
? ?  Patient Name: Traci [name]  ?DOB: 08/10/1941 ?MRN: [id number] ? ?Primary Cardiologist: Sanda Klein, MD ? ?Chart reviewed as part of pre-operative protocol coverage.  ? ?Simple (1-2 teeth) dental extractions and cleaning are considered low risk procedures per guidelines and generally do not require any specific cardiac clearance. It is also generally accepted that for simple extractions and dental cleanings, there is no need to interrupt blood thinner therapy. ? ?SBE prophylaxis is not required for the patient from a cardiac standpoint. ? ?I will route this recommendation to the requesting party via Epic fax function and remove from pre-op pool. ? ?Please call with questions. ? ?Almyra Deforest, [place] ?01/22/2022, 10:35 AM  ?

## 2022-01-22 NOTE — Telephone Encounter (Signed)
? ?  Pre-operative Risk Assessment  ?  ?Patient Name: Traci [name]  ?DOB: August 12, 1941 ?MRN: [id number]  ? ? ? ?Request for Surgical Clearance   ? ?Procedure:  Cleaning ? ?Date of Surgery:   Today- 01-22-22 ? ?Clearance                               ?   ?Surgeon:  Dr Pia Mau ?Surgeon's Group or Practice Name:   ?Phone number:  [phone] ?Fax number:  [phone] ?  ?Type of Clearance Requested:   Does she need Antibiotic before cleaning ? ?  ?Type of Anesthesia:  None  ?  ?Additional requests/questions:   ? ?Signed, ?Glyn Ade   ?01/22/2022, 9:33 AM  ? ?

## 2022-03-02 DIAGNOSIS — M79605 Pain in left leg: Secondary | ICD-10-CM | POA: Diagnosis not present

## 2022-03-02 DIAGNOSIS — M5416 Radiculopathy, lumbar region: Secondary | ICD-10-CM | POA: Diagnosis not present

## 2022-04-10 ENCOUNTER — Ambulatory Visit (HOSPITAL_COMMUNITY): Payer: Medicare HMO | Attending: Cardiovascular Disease

## 2022-04-10 DIAGNOSIS — I350 Nonrheumatic aortic (valve) stenosis: Secondary | ICD-10-CM | POA: Diagnosis not present

## 2022-04-11 LAB — ECHOCARDIOGRAM COMPLETE
AR max vel: 1.18 cm2
AV Area VTI: 1.28 cm2
AV Area mean vel: 1.22 cm2
AV Mean grad: 29 mmHg
AV Peak grad: 47.3 mmHg
Ao pk vel: 3.44 m/s
Area-P 1/2: 3.91 cm2
S' Lateral: 2.5 cm

## 2022-04-13 ENCOUNTER — Telehealth: Payer: Self-pay | Admitting: Cardiovascular Disease

## 2022-04-13 NOTE — Telephone Encounter (Signed)
Pt updated with ECHO results along with MD's recommendations. Pt state she has an appointment with Dr. Loletha Grayer in Aug and will discuss more at that time.     Sanda Klein, MD  04/11/2022  5:47 PM EDT     Heart pumping strength remains normal. By all criteria, the aortic stenosis is still moderate, although it has worsened since last we checked. My best bet is that it will be 1-2 years until it is "officially" severe. But if symptoms worsen, I can move up the timetable for evaluation for TAVR. Even the option to discuss participation in the PROGRESS trial where we are are looking to se if there is any benefit to treating AS earlier, in the moderate phase.

## 2022-04-13 NOTE — Telephone Encounter (Signed)
Patient is returning call for results. Please advise  

## 2022-05-02 DIAGNOSIS — E785 Hyperlipidemia, unspecified: Secondary | ICD-10-CM | POA: Diagnosis not present

## 2022-05-02 DIAGNOSIS — R7989 Other specified abnormal findings of blood chemistry: Secondary | ICD-10-CM | POA: Diagnosis not present

## 2022-05-02 DIAGNOSIS — E559 Vitamin D deficiency, unspecified: Secondary | ICD-10-CM | POA: Diagnosis not present

## 2022-05-02 DIAGNOSIS — I10 Essential (primary) hypertension: Secondary | ICD-10-CM | POA: Diagnosis not present

## 2022-05-09 DIAGNOSIS — Z1331 Encounter for screening for depression: Secondary | ICD-10-CM | POA: Diagnosis not present

## 2022-05-09 DIAGNOSIS — Z1339 Encounter for screening examination for other mental health and behavioral disorders: Secondary | ICD-10-CM | POA: Diagnosis not present

## 2022-05-09 DIAGNOSIS — M1990 Unspecified osteoarthritis, unspecified site: Secondary | ICD-10-CM | POA: Diagnosis not present

## 2022-05-09 DIAGNOSIS — N1831 Chronic kidney disease, stage 3a: Secondary | ICD-10-CM | POA: Diagnosis not present

## 2022-05-09 DIAGNOSIS — I350 Nonrheumatic aortic (valve) stenosis: Secondary | ICD-10-CM | POA: Diagnosis not present

## 2022-05-09 DIAGNOSIS — I129 Hypertensive chronic kidney disease with stage 1 through stage 4 chronic kidney disease, or unspecified chronic kidney disease: Secondary | ICD-10-CM | POA: Diagnosis not present

## 2022-05-09 DIAGNOSIS — D692 Other nonthrombocytopenic purpura: Secondary | ICD-10-CM | POA: Diagnosis not present

## 2022-05-09 DIAGNOSIS — E785 Hyperlipidemia, unspecified: Secondary | ICD-10-CM | POA: Diagnosis not present

## 2022-05-09 DIAGNOSIS — Z0000 Encounter for general adult medical examination without abnormal findings: Secondary | ICD-10-CM | POA: Diagnosis not present

## 2022-06-14 ENCOUNTER — Encounter: Payer: Self-pay | Admitting: Cardiovascular Disease

## 2022-06-14 ENCOUNTER — Ambulatory Visit: Payer: Medicare HMO | Admitting: Cardiovascular Disease

## 2022-06-14 VITALS — BP 168/78 | HR 77 | Ht 67.0 in | Wt 215.4 lb

## 2022-06-14 DIAGNOSIS — I480 Paroxysmal atrial fibrillation: Secondary | ICD-10-CM

## 2022-06-14 DIAGNOSIS — E669 Obesity, unspecified: Secondary | ICD-10-CM

## 2022-06-14 DIAGNOSIS — R55 Syncope and collapse: Secondary | ICD-10-CM

## 2022-06-14 DIAGNOSIS — I350 Nonrheumatic aortic (valve) stenosis: Secondary | ICD-10-CM | POA: Diagnosis not present

## 2022-06-14 DIAGNOSIS — I10 Essential (primary) hypertension: Secondary | ICD-10-CM

## 2022-06-14 LAB — CBC
Hematocrit: 40.7 % (ref 34.0–46.6)
Hemoglobin: 13.3 g/dL (ref 11.1–15.9)
MCH: 29.6 pg (ref 26.6–33.0)
MCHC: 32.7 g/dL (ref 31.5–35.7)
MCV: 91 fL (ref 79–97)
Platelets: 251 10*3/uL (ref 150–450)
RBC: 4.49 x10E6/uL (ref 3.77–5.28)
RDW: 12.4 % (ref 11.7–15.4)
WBC: 14.5 10*3/uL — ABNORMAL HIGH (ref 3.4–10.8)

## 2022-06-14 LAB — BASIC METABOLIC PANEL
BUN/Creatinine Ratio: 16 (ref 12–28)
BUN: 15 mg/dL (ref 8–27)
CO2: 26 mmol/L (ref 20–29)
Calcium: 9.5 mg/dL (ref 8.7–10.3)
Chloride: 105 mmol/L (ref 96–106)
Creatinine, Ser: 0.92 mg/dL (ref 0.57–1.00)
Glucose: 98 mg/dL (ref 70–99)
Potassium: 4.2 mmol/L (ref 3.5–5.2)
Sodium: 143 mmol/L (ref 134–144)
eGFR: 63 mL/min/{1.73_m2} (ref >59)

## 2022-06-14 NOTE — Progress Notes (Signed)
Cardiology Office Note    Date:  06/14/2022   ID:  Traci [name], DOB 27-Jun-1941, MRN [id number]  PCP:  Burnard Bunting, MD  Cardiologist:   Sanda Klein, MD   Chief Complaint  Patient presents with   Shortness of Breath   Cardiac Valve Problem    History of Present Illness:  Traci [name] is a 81 y.o. female with a single episode of vasovagal syncope (about 2 years ago) and moderate aortic valve stenosis, hyperlipidemia and hypertension.  She had a single episode of paroxysmal atrial fibrillation in 2012 immediately after a laparoscopic cholecystectomy when she was also hypokalemic (no clinical recurrence in 10 years).  There has been a little bit of decline in her functional abilities.  She gets short of breath more easily.  She continues to take care of her own household chores, but has to do it slower.  Overall appears to have NYHA functional class II shortness of breath.  Does not develop dyspnea getting dressed or taking a shower.  She has not had exertional chest pain or syncope.  She denies orthopnea, PND or change in her chronic lower extremity edema.  She does have varicose veins.  Most recent echocardiogram performed 04/10/2022 showed further progression of aortic stenosis, although still squarely in moderate range (peak velocity 3.4 m/second mean gradient 29 mmHg, calculated aortic valve area 1.28 cm, dimensionless index 0.34).  Diastolic function is mildly abnormal but E/e'<10 still suggests normal filling pressures.   Past Medical History:  Diagnosis Date   GERD (gastroesophageal reflux disease)    High cholesterol    Hypertension    Irregular heart rate     Past Surgical History:  Procedure Laterality Date   CHOLECYSTECTOMY      Current Medications: Outpatient Medications Prior to Visit  Medication Sig Dispense Refill   aspirin EC 81 MG tablet Take 81 mg by mouth daily. Swallow whole.     atorvastatin (LIPITOR) 20 MG tablet Take 20 mg by mouth daily.      cholecalciferol (VITAMIN D) 1000 UNITS tablet Take 1,000 Units by mouth daily.     lisinopril (ZESTRIL) 20 MG tablet TAKE 1 TABLET BY MOUTH EVERY DAY 90 tablet 2   pantoprazole (PROTONIX) 40 MG tablet Take 40 mg by mouth daily.       No facility-administered medications prior to visit.     Allergies:   Patient has no known allergies.   Social History   Socioeconomic History   Marital status: Widowed    Spouse name: Not on file   Number of children: Not on file   Years of education: Not on file   Highest education level: Not on file  Occupational History   Not on file  Tobacco Use   Smoking status: Never   Smokeless tobacco: Never  Substance and Sexual Activity   Alcohol use: Yes    Comment: OCCASIONALLY   Drug use: No   Sexual activity: Not on file  Other Topics Concern   Not on file  Social History Narrative   Not on file   Social Determinants of Health   Financial Resource Strain: Not on file  Food Insecurity: Not on file  Transportation Needs: Not on file  Physical Activity: Not on file  Stress: Not on file  Social Connections: Not on file     Family History:  The patient's family history includes Heart disease in her father.   ROS:   Please see the history of present illness.  ROS  All other systems are reviewed and are negative.   PHYSICAL EXAM:   VS:  BP (!) 168/78 (BP Location: Left Arm, Patient Position: Sitting, Cuff Size: Large)   Pulse 77   Ht '5\' 7"'$  (1.702 m)   Wt 215 lb 6.4 oz (97.7 kg)   SpO2 93%   BMI 33.74 kg/m      General: Alert, oriented x3, no distress, mildly obese Head: no evidence of trauma, PERRL, EOMI, no exophtalmos or lid lag, no myxedema, no xanthelasma; normal ears, nose and oropharynx Neck: normal jugular venous pulsations and no hepatojugular reflux; brisk carotid pulses without delay and no carotid bruits Chest: clear to auscultation, no signs of consolidation by percussion or palpation, normal fremitus, symmetrical and  full respiratory excursions Cardiovascular: normal position and quality of the apical impulse, regular rhythm, normal first and second heart sounds, mid peaking 3/6 aortic ejection murmur, no diastolic murmurs, rubs or gallops Abdomen: no tenderness or distention, no masses by palpation, no abnormal pulsatility or arterial bruits, normal bowel sounds, no hepatosplenomegaly Extremities: no clubbing, cyanosis or edema; 2+ radial, ulnar and brachial pulses bilaterally; 2+ right femoral, posterior tibial and dorsalis pedis pulses; 2+ left femoral, posterior tibial and dorsalis pedis pulses; no subclavian or femoral bruits Neurological: grossly nonfocal Psych: Normal mood and affect    Wt Readings from Last 3 Encounters:  06/14/22 215 lb 6.4 oz (97.7 kg)  06/30/21 214 lb (97.1 kg)  05/31/20 215 lb 9.6 oz (97.8 kg)      Studies/Labs Reviewed:   EKG:  EKG is ordered today.  Normal sinus rhythm, slightly delayed R wave progression, otherwise normal tracing. Recent Labs: 04/15/2019 Total cholesterol 161, HDL 60, LDL 90, triglycerides 57 Hemoglobin 12.5, creatinine 1.0, normal TSH  04/26/2021 Cholesterol 173, HDL 75, LDL 81, triglycerides 87  05/02/2022 Cholesterol 172, HDL 65, LDL 88, triglycerides 94 Potassium 4.5, ALT 12   ASSESSMENT:    1. Aortic valve stenosis, nonrheumatic   2. Vasovagal syncope   3. Paroxysmal atrial fibrillation (HCC)   4. Essential hypertension   5. Mild obesity       PLAN:  In order of problems listed above:  Aortic stenosis: Hemodynamic parameters by echo are firmly in the moderate range, but she has symptoms that suggest she may have progressed to severe aortic stenosis.  We will schedule for right and left heart catheterization (next week with Dr. Darlina Guys).  If findings confirm that she has only moderate aortic stenosis would recommend inclusion in the PROGRESS trial.  If cath suggest severe aortic stenosis would continue with work-up for TAVR  with a CT angiogram.  I think she is best suited for TAVR, but even if she needs surgical AVR, I think she would be a good candidate for this as well, despite her age.  Recommended that she follow-up with her dentist to make sure there are no procedures that need to be performed before aortic valve replacement.  Syncope: No recurrence since her last appointment a year ago.  She is prone to infrequent vasovagal events.  None over the last year reminded her to stay well-hydrated and avoid sudden changes in position. AFib: Had a single event 10 years ago in the perioperative period during hypokalemia.  If true atrial fibrillation is again documented she will need full anticoagulation (CHADSVasc 3: age, gender, HTN) HTN: Well-controlled HLP: All lipid parameters are acceptable.  Continue statin. Obesity: Losing some weight would make for low risk of complications and a quicker recovery  after either surgical or transarterial valve replacement.   Shared Decision Making/Informed Consent The risks [stroke (1 in 1000), death (1 in 1000), kidney failure [usually temporary] (1 in 500), bleeding (1 in 200), allergic reaction [possibly serious] (1 in 200)], benefits (diagnostic support and management of coronary artery disease) and alternatives of a cardiac catheterization were discussed in detail with Ms. Carra and she is willing to proceed.    Medication Adjustments/Labs and Tests Ordered: Current medicines are reviewed at length with the patient today.  Concerns regarding medicines are outlined above.  Medication changes, Labs and Tests ordered today are listed in the Patient Instructions below. Patient Instructions  Medication Instructions:  No change  *If you need a refill on your cardiac medications before your next appointment, please call your pharmacy*  Testing/Procedures: Your physician has requested that you have a cardiac catheterization. Cardiac catheterization is used to diagnose and/or treat  various heart conditions. Doctors may recommend this procedure for a number of different reasons. The most common reason is to evaluate chest pain. Chest pain can be a symptom of coronary artery disease (CAD), and cardiac catheterization can show whether plaque is narrowing or blocking your heart's arteries. This procedure is also used to evaluate the valves, as well as measure the blood flow and oxygen levels in different parts of your heart. For further information please visit [email or web address]. Please follow instruction sheet, as given.   Follow-Up: At [facility], you and your health needs are our priority.  As part of our continuing mission to provide you with exceptional heart care, we have created designated Provider Care Teams.  These Care Teams include your primary Cardiologist (physician) and Advanced Practice Providers (APPs -  Physician Assistants and Nurse Practitioners) who all work together to provide you with the care you need, when you need it.  We recommend signing up for the patient portal called "MyChart".  Sign up information is provided on this After Visit Summary.  MyChart is used to connect with patients for Virtual Visits (Telemedicine).  Patients are able to view lab/test results, encounter notes, upcoming appointments, etc.  Non-urgent messages can be sent to your provider as well.   To learn more about what you can do with MyChart, go to [email or web address].    Your next appointment:   Keep your post procedure follow up on 8/25 at 10:20 with Dr. Sallyanne Kuster  Other Instructions  [facility] CARDIOVASCULAR DIVISION [facility] [address] [place] [address] Dept: [phone] Loc: [place]  06/14/2022  You are scheduled for a Cardiac Catheterization on Wednesday, August 9 with Dr. [name].  1. Please arrive at the Main Entrance A at [facility]: [place], [place] [address] at 11:00 AM (This time is two hours before your procedure to ensure your preparation). Free valet parking service is available.   Special note: Every effort is made to have your procedure done on time. Please understand that emergencies sometimes delay scheduled procedures.  2. Diet: Do not eat solid foods after midnight.  You may have clear liquids until 5 AM upon the day of the procedure.  3. Labs: You will need to have blood drawn on 06/14/22. You do not need to be fasting.  4. Medication instructions in preparation for your procedure: Nothing to hold  On the morning of your procedure, take Aspirin and any morning medicines NOT listed above.  You may use sips of water.  5. Plan to go home the same day, you will only stay overnight if medically necessary. 6. You MUST have a responsible adult to drive you home. 7. An adult MUST be with you the first 24 hours after you arrive home. 8. Bring a current list of your medications, and the last time and date medication taken. 9. Bring ID and current insurance cards. 10.Please wear clothes that are easy to get on and off and wear slip-on shoes.  Thank you for allowing [place] to care for you!   -- [facility] Invasive Cardiovascular services       Signed, Sanda Klein, MD  06/14/2022 11:42 AM    [address], [place], [place]  [address] Phone: [phone]; Fax: [phone]

## 2022-06-14 NOTE — Patient Instructions (Addendum)
Medication Instructions:  No change  *If you need a refill on your cardiac medications before your next appointment, please call your pharmacy*  Testing/Procedures: Your physician has requested that you have a cardiac catheterization. Cardiac catheterization is used to diagnose and/or treat various heart conditions. Doctors may recommend this procedure for a number of different reasons. The most common reason is to evaluate chest pain. Chest pain can be a symptom of coronary artery disease (CAD), and cardiac catheterization can show whether plaque is narrowing or blocking your heart's arteries. This procedure is also used to evaluate the valves, as well as measure the blood flow and oxygen levels in different parts of your heart. For further information please visit [email or web address]. Please follow instruction sheet, as given.   Follow-Up: At [facility], you and your health needs are our priority.  As part of our continuing mission to provide you with exceptional heart care, we have created designated Provider Care Teams.  These Care Teams include your primary Cardiologist (physician) and Advanced Practice Providers (APPs -  Physician Assistants and Nurse Practitioners) who all work together to provide you with the care you need, when you need it.  We recommend signing up for the patient portal called "MyChart".  Sign up information is provided on this After Visit Summary.  MyChart is used to connect with patients for Virtual Visits (Telemedicine).  Patients are able to view lab/test results, encounter notes, upcoming appointments, etc.  Non-urgent messages can be sent to your provider as well.   To learn more about what you can do with MyChart, go to [email or web address].    Your next appointment:   Keep your post procedure follow up on 8/25 at 10:20 with Dr. Sallyanne Kuster  Other Instructions  [place] [facility] CARDIOVASCULAR DIVISION [facility] [address] [place] [address] Dept: [phone] Loc: [place]  06/14/2022  You are scheduled for a Cardiac Catheterization on Wednesday, August 9 with Dr. [name].  1. Please arrive at the Main Entrance A at [facility]: [place], [place] [address] at 11:00 AM (This time is two hours before your procedure to ensure your preparation). Free valet parking service is available.   Special note: Every effort is made to have your procedure done on time. Please understand that emergencies sometimes delay scheduled procedures.  2. Diet: Do not eat solid foods after midnight.  You may have clear liquids until 5 AM upon the day of the procedure.  3. Labs: You will need to have blood drawn on 06/14/22. You do not need to be fasting.  4. Medication instructions in preparation for your procedure: Nothing to hold  On the morning of your procedure, take Aspirin and any morning medicines NOT listed above.  You may use sips of water.  5. Plan to go home the same day, you will only stay overnight if medically necessary. 6. You MUST have a responsible adult to drive you home. 7. An adult MUST be with you the first 24 hours after you arrive home. 8. Bring a current list of your medications, and the last time and date medication taken. 9. Bring ID and current insurance cards. 10.Please wear clothes that are easy to get on and off and wear slip-on shoes.  Thank you for allowing [place] to care for you!   -- [place] [facility] Cardi[facility] services

## 2022-06-14 NOTE — H&P (View-Only) (Signed)
Cardiology Office Note    Date:  06/14/2022   ID:  Traci [name], DOB 12-28-1940, MRN [id number]  PCP:  Burnard Bunting, MD  Cardiologist:   Sanda Klein, MD   Chief Complaint  Patient presents with   Shortness of Breath   Cardiac Valve Problem    History of Present Illness:  Traci [name] is a 81 y.o. female with a single episode of vasovagal syncope (about 2 years ago) and moderate aortic valve stenosis, hyperlipidemia and hypertension.  She had a single episode of paroxysmal atrial fibrillation in 2012 immediately after a laparoscopic cholecystectomy when she was also hypokalemic (no clinical recurrence in 10 years).  There has been a little bit of decline in her functional abilities.  She gets short of breath more easily.  She continues to take care of her own household chores, but has to do it slower.  Overall appears to have NYHA functional class II shortness of breath.  Does not develop dyspnea getting dressed or taking a shower.  She has not had exertional chest pain or syncope.  She denies orthopnea, PND or change in her chronic lower extremity edema.  She does have varicose veins.  Most recent echocardiogram performed 04/10/2022 showed further progression of aortic stenosis, although still squarely in moderate range (peak velocity 3.4 m/second mean gradient 29 mmHg, calculated aortic valve area 1.28 cm, dimensionless index 0.34).  Diastolic function is mildly abnormal but E/e'<10 still suggests normal filling pressures.   Past Medical History:  Diagnosis Date   GERD (gastroesophageal reflux disease)    High cholesterol    Hypertension    Irregular heart rate     Past Surgical History:  Procedure Laterality Date   CHOLECYSTECTOMY      Current Medications: Outpatient Medications Prior to Visit  Medication Sig Dispense Refill   aspirin EC 81 MG tablet Take 81 mg by mouth daily. Swallow whole.     atorvastatin (LIPITOR) 20 MG tablet Take 20 mg by mouth daily.      cholecalciferol (VITAMIN D) 1000 UNITS tablet Take 1,000 Units by mouth daily.     lisinopril (ZESTRIL) 20 MG tablet TAKE 1 TABLET BY MOUTH EVERY DAY 90 tablet 2   pantoprazole (PROTONIX) 40 MG tablet Take 40 mg by mouth daily.       No facility-administered medications prior to visit.     Allergies:   Patient has no known allergies.   Social History   Socioeconomic History   Marital status: Widowed    Spouse name: Not on file   Number of children: Not on file   Years of education: Not on file   Highest education level: Not on file  Occupational History   Not on file  Tobacco Use   Smoking status: Never   Smokeless tobacco: Never  Substance and Sexual Activity   Alcohol use: Yes    Comment: OCCASIONALLY   Drug use: No   Sexual activity: Not on file  Other Topics Concern   Not on file  Social History Narrative   Not on file   Social Determinants of Health   Financial Resource Strain: Not on file  Food Insecurity: Not on file  Transportation Needs: Not on file  Physical Activity: Not on file  Stress: Not on file  Social Connections: Not on file     Family History:  The patient's family history includes Heart disease in her father.   ROS:   Please see the history of present illness.  ROS  All other systems are reviewed and are negative.   PHYSICAL EXAM:   VS:  BP (!) 168/78 (BP Location: Left Arm, Patient Position: Sitting, Cuff Size: Large)   Pulse 77   Ht '5\' 7"'$  (1.702 m)   Wt 215 lb 6.4 oz (97.7 kg)   SpO2 93%   BMI 33.74 kg/m      General: Alert, oriented x3, no distress, mildly obese Head: no evidence of trauma, PERRL, EOMI, no exophtalmos or lid lag, no myxedema, no xanthelasma; normal ears, nose and oropharynx Neck: normal jugular venous pulsations and no hepatojugular reflux; brisk carotid pulses without delay and no carotid bruits Chest: clear to auscultation, no signs of consolidation by percussion or palpation, normal fremitus, symmetrical and  full respiratory excursions Cardiovascular: normal position and quality of the apical impulse, regular rhythm, normal first and second heart sounds, mid peaking 3/6 aortic ejection murmur, no diastolic murmurs, rubs or gallops Abdomen: no tenderness or distention, no masses by palpation, no abnormal pulsatility or arterial bruits, normal bowel sounds, no hepatosplenomegaly Extremities: no clubbing, cyanosis or edema; 2+ radial, ulnar and brachial pulses bilaterally; 2+ right femoral, posterior tibial and dorsalis pedis pulses; 2+ left femoral, posterior tibial and dorsalis pedis pulses; no subclavian or femoral bruits Neurological: grossly nonfocal Psych: Normal mood and affect    Wt Readings from Last 3 Encounters:  06/14/22 215 lb 6.4 oz (97.7 kg)  06/30/21 214 lb (97.1 kg)  05/31/20 215 lb 9.6 oz (97.8 kg)      Studies/Labs Reviewed:   EKG:  EKG is ordered today.  Normal sinus rhythm, slightly delayed R wave progression, otherwise normal tracing. Recent Labs: 04/15/2019 Total cholesterol 161, HDL 60, LDL 90, triglycerides 57 Hemoglobin 12.5, creatinine 1.0, normal TSH  04/26/2021 Cholesterol 173, HDL 75, LDL 81, triglycerides 87  05/02/2022 Cholesterol 172, HDL 65, LDL 88, triglycerides 94 Potassium 4.5, ALT 12   ASSESSMENT:    1. Aortic valve stenosis, nonrheumatic   2. Vasovagal syncope   3. Paroxysmal atrial fibrillation (HCC)   4. Essential hypertension   5. Mild obesity       PLAN:  In order of problems listed above:  Aortic stenosis: Hemodynamic parameters by echo are firmly in the moderate range, but she has symptoms that suggest she may have progressed to severe aortic stenosis.  We will schedule for right and left heart catheterization (next week with Dr. Darlina Guys).  If findings confirm that she has only moderate aortic stenosis would recommend inclusion in the PROGRESS trial.  If cath suggest severe aortic stenosis would continue with work-up for TAVR  with a CT angiogram.  I think she is best suited for TAVR, but even if she needs surgical AVR, I think she would be a good candidate for this as well, despite her age.  Recommended that she follow-up with her dentist to make sure there are no procedures that need to be performed before aortic valve replacement.  Syncope: No recurrence since her last appointment a year ago.  She is prone to infrequent vasovagal events.  None over the last year reminded her to stay well-hydrated and avoid sudden changes in position. AFib: Had a single event 10 years ago in the perioperative period during hypokalemia.  If true atrial fibrillation is again documented she will need full anticoagulation (CHADSVasc 3: age, gender, HTN) HTN: Well-controlled HLP: All lipid parameters are acceptable.  Continue statin. Obesity: Losing some weight would make for low risk of complications and a quicker recovery  after either surgical or transarterial valve replacement.   Shared Decision Making/Informed Consent The risks [stroke (1 in 1000), death (1 in 1000), kidney failure [usually temporary] (1 in 500), bleeding (1 in 200), allergic reaction [possibly serious] (1 in 200)], benefits (diagnostic support and management of coronary artery disease) and alternatives of a cardiac catheterization were discussed in detail with Ms. Jablon and she is willing to proceed.    Medication Adjustments/Labs and Tests Ordered: Current medicines are reviewed at length with the patient today.  Concerns regarding medicines are outlined above.  Medication changes, Labs and Tests ordered today are listed in the Patient Instructions below. Patient Instructions  Medication Instructions:  No change  *If you need a refill on your cardiac medications before your next appointment, please call your pharmacy*  Testing/Procedures: Your physician has requested that you have a cardiac catheterization. Cardiac catheterization is used to diagnose and/or treat  various heart conditions. Doctors may recommend this procedure for a number of different reasons. The most common reason is to evaluate chest pain. Chest pain can be a symptom of coronary artery disease (CAD), and cardiac catheterization can show whether plaque is narrowing or blocking your heart's arteries. This procedure is also used to evaluate the valves, as well as measure the blood flow and oxygen levels in different parts of your heart. For further information please visit [email or web address]. Please follow instruction sheet, as given.   Follow-Up: At [facility], you and your health needs are our priority.  As part of our continuing mission to provide you with exceptional heart care, we have created designated Provider Care Teams.  These Care Teams include your primary Cardiologist (physician) and Advanced Practice Providers (APPs -  Physician Assistants and Nurse Practitioners) who all work together to provide you with the care you need, when you need it.  We recommend signing up for the patient portal called "MyChart".  Sign up information is provided on this After Visit Summary.  MyChart is used to connect with patients for Virtual Visits (Telemedicine).  Patients are able to view lab/test results, encounter notes, upcoming appointments, etc.  Non-urgent messages can be sent to your provider as well.   To learn more about what you can do with MyChart, go to [email or web address].    Your next appointment:   Keep your post procedure follow up on 8/25 at 10:20 with Dr. Sallyanne Kuster  Other Instructions  [place] [facility] CARDIOVASCULAR DIVISION [facility] [address] [place] [address] Dept: [phone] Loc: [place]  06/14/2022  You are scheduled for a Cardiac Catheterization on Wednesday, August 9 with Dr. [name].  1. Please arrive at the Main Entrance A at [facility]: [place], [place] [address] at 11:00 AM (This time is two hours before your procedure to ensure your preparation). Free valet parking service is available.   Special note: Every effort is made to have your procedure done on time. Please understand that emergencies sometimes delay scheduled procedures.  2. Diet: Do not eat solid foods after midnight.  You may have clear liquids until 5 AM upon the day of the procedure.  3. Labs: You will need to have blood drawn on 06/14/22. You do not need to be fasting.  4. Medication instructions in preparation for your procedure: Nothing to hold  On the morning of your procedure, take Aspirin and any morning medicines NOT listed above.  You may use sips of water.  5. Plan to go home the same day, you will only stay overnight if medically necessary. 6. You MUST have a responsible adult to drive you home. 7. An adult MUST be with you the first 24 hours after you arrive home. 8. Bring a current list of your medications, and the last time and date medication taken. 9. Bring ID and current insurance cards. 10.Please wear clothes that are easy to get on and off and wear slip-on shoes.  Thank you for allowing [place] to care for you!   -- [facility] Invasive Cardiovascular services       Signed, Sanda Klein, MD  06/14/2022 11:42 AM    [address], [place], [place]  [address] Phone: [phone]; Fax: [phone]

## 2022-06-15 ENCOUNTER — Ambulatory Visit: Payer: Medicare HMO | Admitting: Cardiovascular Disease

## 2022-06-20 ENCOUNTER — Ambulatory Visit (HOSPITAL_COMMUNITY)
Admission: RE | Admit: 2022-06-20 | Discharge: 2022-06-20 | Disposition: A | Discharge status: Home / Self Care | Payer: Medicare HMO | Attending: Cardiovascular Disease | Admitting: Cardiovascular Disease

## 2022-06-20 ENCOUNTER — Encounter (HOSPITAL_COMMUNITY)
Admission: RE | Disposition: A | Discharge status: Home / Self Care | Payer: Self-pay | Source: Home / Self Care | Attending: Cardiovascular Disease

## 2022-06-20 ENCOUNTER — Other Ambulatory Visit: Payer: Self-pay

## 2022-06-20 DIAGNOSIS — E785 Hyperlipidemia, unspecified: Secondary | ICD-10-CM | POA: Insufficient documentation

## 2022-06-20 DIAGNOSIS — I10 Essential (primary) hypertension: Secondary | ICD-10-CM | POA: Diagnosis not present

## 2022-06-20 DIAGNOSIS — E669 Obesity, unspecified: Secondary | ICD-10-CM | POA: Diagnosis not present

## 2022-06-20 DIAGNOSIS — I2510 Atherosclerotic heart disease of native coronary artery without angina pectoris: Secondary | ICD-10-CM | POA: Diagnosis not present

## 2022-06-20 DIAGNOSIS — Z6833 Body mass index (BMI) 33.0-33.9, adult: Secondary | ICD-10-CM | POA: Diagnosis not present

## 2022-06-20 DIAGNOSIS — I350 Nonrheumatic aortic (valve) stenosis: Secondary | ICD-10-CM | POA: Diagnosis not present

## 2022-06-20 HISTORY — PX: RIGHT/LEFT HEART CATH AND CORONARY ANGIOGRAPHY: CATH118266

## 2022-06-20 SURGERY — RIGHT/LEFT HEART CATH AND CORONARY ANGIOGRAPHY
Anesthesia: LOCAL

## 2022-06-20 MED ORDER — SODIUM CHLORIDE 0.9 % IV SOLN
INTRAVENOUS | Status: DC
Start: 2022-06-20 — End: 2022-06-20

## 2022-06-20 MED ORDER — VERAPAMIL HCL 2.5 MG/ML IV SOLN
INTRAVENOUS | Status: AC
  Filled 2022-06-20: qty 2

## 2022-06-20 MED ORDER — SODIUM CHLORIDE 0.9 % IV SOLN: 250.0000 mL | INTRAVENOUS | Status: DC | PRN

## 2022-06-20 MED ORDER — LABETALOL HCL 5 MG/ML IV SOLN: 10.0000 mg | INTRAVENOUS | Status: DC | PRN

## 2022-06-20 MED ORDER — SODIUM CHLORIDE 0.9% FLUSH: 3.0000 mL | INTRAVENOUS | Status: DC | PRN

## 2022-06-20 MED ORDER — SODIUM CHLORIDE 0.9% FLUSH: 3.0000 mL | Freq: Two times a day (BID) | INTRAVENOUS | Status: DC

## 2022-06-20 MED ORDER — HEPARIN SODIUM (PORCINE) 1000 UNIT/ML IJ SOLN
INTRAMUSCULAR | Status: AC
  Filled 2022-06-20: qty 10

## 2022-06-20 MED ORDER — IOHEXOL 350 MG/ML SOLN
INTRAVENOUS | Status: DC | PRN
  Administered 2022-06-20: 40 mL

## 2022-06-20 MED ORDER — HEPARIN (PORCINE) IN NACL 1000-0.9 UT/500ML-% IV SOLN
INTRAVENOUS | Status: AC
  Filled 2022-06-20: qty 1000

## 2022-06-20 MED ORDER — FENTANYL CITRATE (PF) 100 MCG/2ML IJ SOLN
INTRAMUSCULAR | Status: AC
  Filled 2022-06-20: qty 2

## 2022-06-20 MED ORDER — HEPARIN (PORCINE) IN NACL 1000-0.9 UT/500ML-% IV SOLN
INTRAVENOUS | Status: DC | PRN
  Administered 2022-06-20 (×2): 500 mL

## 2022-06-20 MED ORDER — ONDANSETRON HCL 4 MG/2ML IJ SOLN: 4.0000 mg | Freq: Four times a day (QID) | INTRAMUSCULAR | Status: DC | PRN

## 2022-06-20 MED ORDER — SODIUM CHLORIDE 0.9 % WEIGHT BASED INFUSION: 1.0000 mL/kg/h | INTRAVENOUS | Status: DC

## 2022-06-20 MED ORDER — LIDOCAINE HCL (PF) 1 % IJ SOLN
INTRAMUSCULAR | Status: DC | PRN
  Administered 2022-06-20 (×2): 2 mL

## 2022-06-20 MED ORDER — ACETAMINOPHEN 325 MG PO TABS: 650.0000 mg | ORAL_TABLET | ORAL | Status: DC | PRN

## 2022-06-20 MED ORDER — VERAPAMIL HCL 2.5 MG/ML IV SOLN
INTRAVENOUS | Status: DC | PRN
  Administered 2022-06-20: 10 mL via INTRA_ARTERIAL

## 2022-06-20 MED ORDER — FENTANYL CITRATE (PF) 100 MCG/2ML IJ SOLN
INTRAMUSCULAR | Status: DC | PRN
  Administered 2022-06-20: 50 ug via INTRAVENOUS

## 2022-06-20 MED ORDER — ASPIRIN 81 MG PO CHEW: 81.0000 mg | CHEWABLE_TABLET | ORAL | Status: DC

## 2022-06-20 MED ORDER — MIDAZOLAM HCL 2 MG/2ML IJ SOLN
INTRAMUSCULAR | Status: DC | PRN
  Administered 2022-06-20: 2 mg via INTRAVENOUS

## 2022-06-20 MED ORDER — HEPARIN SODIUM (PORCINE) 1000 UNIT/ML IJ SOLN
INTRAMUSCULAR | Status: DC | PRN
  Administered 2022-06-20: 5000 [IU] via INTRAVENOUS

## 2022-06-20 MED ORDER — HYDRALAZINE HCL 20 MG/ML IJ SOLN: 10.0000 mg | INTRAMUSCULAR | Status: DC | PRN

## 2022-06-20 MED ORDER — SODIUM CHLORIDE 0.9 % WEIGHT BASED INFUSION
3.0000 mL/kg/h | INTRAVENOUS | Status: AC
  Administered 2022-06-20: 3 mL/kg/h via INTRAVENOUS

## 2022-06-20 MED ORDER — LIDOCAINE HCL (PF) 1 % IJ SOLN
INTRAMUSCULAR | Status: AC
  Filled 2022-06-20: qty 30

## 2022-06-20 MED ORDER — MIDAZOLAM HCL 2 MG/2ML IJ SOLN
INTRAMUSCULAR | Status: AC
  Filled 2022-06-20: qty 2

## 2022-06-20 SURGICAL SUPPLY — 11 items
BAND ZEPHYR COMPRESS 30 LONG (HEMOSTASIS) ×1 IMPLANT
CATH 5FR JL3.5 JR4 ANG PIG MP (CATHETERS) ×1 IMPLANT
CATH BALLN WEDGE 5F 110CM (CATHETERS) ×1 IMPLANT
GLIDESHEATH SLEND SS 6F .021 (SHEATH) ×1 IMPLANT
GUIDEWIRE INQWIRE 1.5J.035X260 (WIRE) IMPLANT
INQWIRE 1.5J .035X260CM (WIRE) ×2
KIT HEART LEFT (KITS) ×2 IMPLANT
PACK CARDIAC CATHETERIZATION (CUSTOM PROCEDURE TRAY) ×2 IMPLANT
SHEATH GLIDE SLENDER 4/5FR (SHEATH) ×1 IMPLANT
TRANSDUCER W/STOPCOCK (MISCELLANEOUS) ×2 IMPLANT
TUBING CIL FLEX 10 FLL-RA (TUBING) ×2 IMPLANT

## 2022-06-20 NOTE — Progress Notes (Signed)
Patient TRB is off.

## 2022-06-20 NOTE — Discharge Instructions (Signed)

## 2022-06-20 NOTE — Interval H&P Note (Signed)
History and Physical Interval Note:  06/20/2022 1:30 PM  Traci [name]  has presented today for surgery, with the diagnosis of aortic stenosis.  The various methods of treatment have been discussed with the patient and family. After consideration of risks, benefits and other options for treatment, the patient has consented to  Procedure(s): RIGHT/LEFT HEART CATH AND CORONARY ANGIOGRAPHY (N/A) as a surgical intervention.  The patient's history has been reviewed, patient examined, no change in status, stable for surgery.  I have reviewed the patient's chart and labs.  Questions were answered to the patient's satisfaction.    Cath Lab Visit (complete for each Cath Lab visit)  Clinical Evaluation Leading to the Procedure:   ACS: No.  Non-ACS:    Anginal Classification: CCS II  Anti-ischemic medical therapy: No Therapy  Non-Invasive Test Results: No non-invasive testing performed  Prior CABG: No previous CABG        [name]

## 2022-06-20 NOTE — Progress Notes (Signed)
Patient and son was given discharge instructions. Both verbalized understanding. 

## 2022-06-21 ENCOUNTER — Encounter (HOSPITAL_COMMUNITY): Payer: Self-pay | Admitting: Cardiovascular Disease

## 2022-06-22 LAB — POCT I-STAT EG7
Acid-base deficit: 1 mmol/L (ref 0.0–2.0)
Bicarbonate: 24.4 mmol/L (ref 20.0–28.0)
Calcium, Ion: 0.88 mmol/L — CL (ref 1.15–1.40)
HCT: 29 % — ABNORMAL LOW (ref 36.0–46.0)
Hemoglobin: 9.9 g/dL — ABNORMAL LOW (ref 12.0–15.0)
O2 Saturation: 72 %
Potassium: 2.6 mmol/L — CL (ref 3.5–5.1)
Sodium: 150 mmol/L — ABNORMAL HIGH (ref 135–145)
TCO2: 26 mmol/L (ref 22–32)
pCO2, Ven: 43.6 mmHg — ABNORMAL LOW (ref 44–60)
pH, Ven: 7.356 (ref 7.25–7.43)
pO2, Ven: 40 mmHg (ref 32–45)

## 2022-06-25 LAB — POCT I-STAT 7, (LYTES, BLD GAS, ICA,H+H)
Acid-Base Excess: 1 mmol/L (ref 0.0–2.0)
Bicarbonate: 26.3 mmol/L (ref 20.0–28.0)
Calcium, Ion: 1.18 mmol/L (ref 1.15–1.40)
HCT: 33 % — ABNORMAL LOW (ref 36.0–46.0)
Hemoglobin: 11.2 g/dL — ABNORMAL LOW (ref 12.0–15.0)
O2 Saturation: 91 %
Potassium: 3.4 mmol/L — ABNORMAL LOW (ref 3.5–5.1)
Sodium: 144 mmol/L (ref 135–145)
TCO2: 28 mmol/L (ref 22–32)
pCO2 arterial: 45.7 mmHg (ref 32–48)
pH, Arterial: 7.369 (ref 7.35–7.45)
pO2, Arterial: 64 mmHg — ABNORMAL LOW (ref 83–108)

## 2022-07-02 ENCOUNTER — Institutional Professional Consult (permissible substitution): Payer: Medicare HMO | Admitting: Internal Medicine

## 2022-07-06 ENCOUNTER — Ambulatory Visit: Payer: Medicare HMO | Admitting: Cardiovascular Disease

## 2022-07-06 ENCOUNTER — Encounter: Payer: Self-pay | Admitting: Cardiovascular Disease

## 2022-07-06 VITALS — BP 139/79 | HR 70 | Ht 67.0 in | Wt 212.4 lb

## 2022-07-06 DIAGNOSIS — E669 Obesity, unspecified: Secondary | ICD-10-CM | POA: Diagnosis not present

## 2022-07-06 DIAGNOSIS — R55 Syncope and collapse: Secondary | ICD-10-CM | POA: Diagnosis not present

## 2022-07-06 DIAGNOSIS — I10 Essential (primary) hypertension: Secondary | ICD-10-CM

## 2022-07-06 DIAGNOSIS — I480 Paroxysmal atrial fibrillation: Secondary | ICD-10-CM

## 2022-07-06 DIAGNOSIS — E7800 Pure hypercholesterolemia, unspecified: Secondary | ICD-10-CM

## 2022-07-06 DIAGNOSIS — I350 Nonrheumatic aortic (valve) stenosis: Secondary | ICD-10-CM

## 2022-07-06 NOTE — Progress Notes (Signed)
Cardiology Office Note    Date:  07/06/2022   ID:  Shantika, Bermea 1940/12/06, MRN [id number]  PCP:  Burnard Bunting, MD  Cardiologist:   Sanda Klein, MD   No chief complaint on file.   History of Present Illness:  Traci [name] is a 81 y.o. female with a single episode of vasovagal syncope (about 2 years ago) and moderate aortic valve stenosis, hyperlipidemia and hypertension.  She had a single episode of paroxysmal atrial fibrillation in 2012 immediately after a laparoscopic cholecystectomy when she was also hypokalemic (no clinical recurrence in 10 years).  Recently developed NYHA functional class II exertional dyspnea.  She can still take care of her household chores, but does it slower.  She does not have orthopnea, PND or worsening lower extremity edema (she does have chronic varicose veins).  She denies syncope or palpitations.  She has not complained of chest pain at rest or with activity.  Most recent echocardiogram performed 04/10/2022 showed further progression of aortic stenosis, although still squarely in moderate range (peak velocity 3.4 m/second mean gradient 29 mmHg, calculated aortic valve area 1.28 cm, dimensionless index 0.34).  Diastolic function is mildly abnormal but E/e'<10 still suggests normal filling pressures.  Cardiac catheterization was performed on 06/20/2022 and showed minimal coronary atherosclerosis without any meaningful obstructions and confirmed the echo findings of moderate aortic stenosis.  The mean transvalvular gradient was 37 mmHg.  Right and left heart pressures were normal at rest and she had normal cardiac output.  She has an appointment on Monday with Dr. Sherren Mocha.   Past Medical History:  Diagnosis Date   GERD (gastroesophageal reflux disease)    High cholesterol    Hypertension    Irregular heart rate     Past Surgical History:  Procedure Laterality Date   CHOLECYSTECTOMY     RIGHT/LEFT HEART CATH AND CORONARY  ANGIOGRAPHY N/A 06/20/2022   Procedure: RIGHT/LEFT HEART CATH AND CORONARY ANGIOGRAPHY;  Surgeon: Burnell Blanks, MD;  Location: [facility];  Service: Cardiovascular;  Laterality: N/A;    Current Medications: Outpatient Medications Prior to Visit  Medication Sig Dispense Refill   aspirin EC 81 MG tablet Take 81 mg by mouth daily. Swallow whole.     atorvastatin (LIPITOR) 20 MG tablet Take 20 mg by mouth at bedtime.     cholecalciferol (VITAMIN D) 1000 UNITS tablet Take 1,000 Units by mouth daily.     lisinopril (ZESTRIL) 20 MG tablet TAKE 1 TABLET BY MOUTH EVERY DAY 90 tablet 2   pantoprazole (PROTONIX) 40 MG tablet Take 40 mg by mouth daily.       No facility-administered medications prior to visit.     Allergies:   Patient has no known allergies.   Social History   Socioeconomic History   Marital status: Widowed    Spouse name: Not on file   Number of children: Not on file   Years of education: Not on file   Highest education level: Not on file  Occupational History   Not on file  Tobacco Use   Smoking status: Never   Smokeless tobacco: Never  Substance and Sexual Activity   Alcohol use: Yes    Comment: OCCASIONALLY   Drug use: No   Sexual activity: Not on file  Other Topics Concern   Not on file  Social History Narrative   Not on file   Social Determinants of Health   Financial Resource Strain: Not on file  Food Insecurity:  Not on file  Transportation Needs: Not on file  Physical Activity: Not on file  Stress: Not on file  Social Connections: Not on file     Family History:  The patient's family history includes Heart disease in her father.   ROS:   Please see the history of present illness.    ROS  All other systems are reviewed and are negative.   PHYSICAL EXAM:   VS:  BP 139/79   Pulse 70   Ht '5\' 7"'$  (1.702 m)   Wt 212 lb 6.4 oz (96.3 kg)   SpO2 95%   BMI 33.27 kg/m      General: Alert, oriented x3, no distress, mildly  obese Head: no evidence of trauma, PERRL, EOMI, no exophtalmos or lid lag, no myxedema, no xanthelasma; normal ears, nose and oropharynx Neck: normal jugular venous pulsations and no hepatojugular reflux; brisk carotid pulses without delay and no carotid bruits Chest: clear to auscultation, no signs of consolidation by percussion or palpation, normal fremitus, symmetrical and full respiratory excursions Cardiovascular: normal position and quality of the apical impulse, regular rhythm, normal first and second heart sounds, 3/6 aortic ejection murmur is early to mid peaking, no diastolic murmurs, rubs or gallops Abdomen: no tenderness or distention, no masses by palpation, no abnormal pulsatility or arterial bruits, normal bowel sounds, no hepatosplenomegaly Extremities: no clubbing, cyanosis , bilateral varicose veins, 1+ symmetrical ankle edema  Neurological: grossly nonfocal Psych: Normal mood and affect     Wt Readings from Last 3 Encounters:  07/06/22 212 lb 6.4 oz (96.3 kg)  06/20/22 209 lb (94.8 kg)  06/14/22 215 lb 6.4 oz (97.7 kg)      Studies/Labs Reviewed:   Cardiac catheterization 06/20/2022 :    Prox RCA to Mid RCA lesion is 10% stenosed.   Prox Cx to Mid Cx lesion is 10% stenosed.   Ost LAD to Mid LAD lesion is 10% stenosed.   Mild non-obstructive CAD Moderately severe to severe aortic stenosis. Peak to peak gradient 37.3 mmHg, mean gradient 36.7 mmHg.  Normal right and left heart pressures.  Normal cardiac output   Recommendations: Will review with Dr. Sallyanne Kuster. Given the fact that she is symptomatic and has AS that is bordering on severe, will decide between Progress trial and proceeding with TAVR.   Fick Cardiac Output 7.97 L/min  Fick Cardiac Output Index 3.87 (L/min)/BSA  Aortic Mean Gradient 36.65 mmHg  Aortic Peak Gradient 37.3 mmHg  Aortic Valve Area 1.21  Aortic Value Area Index 0.59 cm2/BSA  RA A Wave 8 mmHg  RA V Wave 5 mmHg  RA Mean 2 mmHg  RV  Systolic Pressure 37 mmHg  RV Diastolic Pressure 1 mmHg  RV EDP 8 mmHg  PA Systolic Pressure 37 mmHg  PA Diastolic Pressure 10 mmHg  PA Mean 19 mmHg  PW A Wave 10 mmHg  PW V Wave 11 mmHg  PW Mean 10 mmHg  AO Systolic Pressure 867 mmHg  AO Diastolic Pressure 60 mmHg  AO Mean 92 mmHg  LV Systolic Pressure 619 mmHg  LV Diastolic Pressure 3 mmHg  LV EDP 17 mmHg  AOp Systolic Pressure 509 mmHg  AOp Diastolic Pressure 61 mmHg  AOp Mean Pressure 98 mmHg  LVp Systolic Pressure 326 mmHg  LVp Diastolic Pressure 14 mmHg  LVp EDP Pressure 15 mmHg  QP/QS 1  TPVR Index 4.91 HRUI  TSVR Index 23.77 HRUI  PVR SVR Ratio 0.1  TPVR/TSVR Ratio 0.21    EKG:  EKG  is ordered today shows normal sinus rhythm, normal tracing.  No ischemic changes.  QTc 427 ms Recent Labs: 04/15/2019 Total cholesterol 161, HDL 60, LDL 90, triglycerides 57 Hemoglobin 12.5, creatinine 1.0, normal TSH  04/26/2021 Cholesterol 173, HDL 75, LDL 81, triglycerides 87  05/02/2022 Cholesterol 172, HDL 65, LDL 88, triglycerides 94 Potassium 4.5, ALT 12   ASSESSMENT:    1. Aortic valve stenosis, nonrheumatic   2. Paroxysmal atrial fibrillation (HCC)   3. Vasovagal syncope   4. Essential hypertension   5. Hypercholesterolemia   6. Mild obesity       PLAN:  In order of problems listed above:  Aortic stenosis: Both the echocardiogram and the cardiac catheterization suggest that she has moderate aortic stenosis with aortic valve area around 1.2 cm the mean gradient is just under 40 mmHg.  She has normal left heart filling pressures at rest.  She has some mild exertional dyspnea.  I think she is a great candidate for the PROGRESS trial, but will have Dr. Burt Knack interview her next Monday and see whether he thinks her symptoms warrant proceeding directly to the TAVR.  She is in the process of getting all her dental issues taken care of.  Syncope: She has a history of events that sound like vasovagal syncope.  She has not  had exertional syncope.  There have been no episodes in well over a year. AFib: It appears she had a single one-time event (10 years ago in the perioperative period, likely precipitated by hypokalemia) .  If recurrent atrial fibrillation is again documented she will need full anticoagulation (CHADSVasc 3: age, gender, HTN) HTN: Well-controlled.  Was high when she first checked in today, but decreased after a few minutes of rest. HLP: Continue statin.  LDL target at less than 100 appears acceptable. Obesity: Weight loss would be beneficial.    Medication Adjustments/Labs and Tests Ordered: Current medicines are reviewed at length with the patient today.  Concerns regarding medicines are outlined above.  Medication changes, Labs and Tests ordered today are listed in the Patient Instructions below. Patient Instructions  Medication Instructions:  No changes *If you need a refill on your cardiac medications before your next appointment, please call your pharmacy*   Lab Work: None ordered If you have labs (blood work) drawn today and your tests are completely normal, you will receive your results only by: [place] (if you have MyChart) OR A paper copy in the mail If you have any lab test that is abnormal or we need to change your treatment, we will call you to review the results.   Testing/Procedures: None ordered   Follow-Up: At [facility], you and your health needs are our priority.  As part of our continuing mission to provide you with exceptional heart care, we have created designated Provider Care Teams.  These Care Teams include your primary Cardiologist (physician) and Advanced Practice Providers (APPs -  Physician Assistants and Nurse Practitioners) who all work together to provide you with the care you need, when you need it.  We recommend signing up for the patient portal called "MyChart".  Sign up information is provided on this After Visit Summary.  MyChart is used to  connect with patients for Virtual Visits (Telemedicine).  Patients are able to view lab/test results, encounter notes, upcoming appointments, etc.  Non-urgent messages can be sent to your provider as well.   To learn more about what you can do with MyChart, go to [email or web address].    Your  next appointment:   6 month(s)  The format for your next appointment:   In Person  Provider:   Sanda Klein, MD {   Important Information About Sugar         Signed, Sanda Klein, MD  07/06/2022 1:10 PM    [address], [place], [place]  [address] Phone: [phone]; Fax: [phone]

## 2022-07-06 NOTE — Patient Instructions (Signed)
Medication Instructions:  No changes *If you need a refill on your cardiac medications before your next appointment, please call your pharmacy*   Lab Work: None ordered If you have labs (blood work) drawn today and your tests are completely normal, you will receive your results only by: MyChart Message (if you have MyChart) OR A paper copy in the mail If you have any lab test that is abnormal or we need to change your treatment, we will call you to review the results.   Testing/Procedures: None ordered   Follow-Up: At [facility], you and your health needs are our priority.  As part of our continuing mission to provide you with exceptional heart care, we have created designated Provider Care Teams.  These Care Teams include your primary Cardiologist (physician) and Advanced Practice Providers (APPs -  Physician Assistants and Nurse Practitioners) who all work together to provide you with the care you need, when you need it.  We recommend signing up for the patient portal called "MyChart".  Sign up information is provided on this After Visit Summary.  MyChart is used to connect with patients for Virtual Visits (Telemedicine).  Patients are able to view lab/test results, encounter notes, upcoming appointments, etc.  Non-urgent messages can be sent to your provider as well.   To learn more about what you can do with MyChart, go to [email or web address].    Your next appointment:   6 month(s)  The format for your next appointment:   In Person  Provider:   [name] Croitoru, MD {    Important Information About Sugar       

## 2022-07-09 ENCOUNTER — Encounter: Payer: Self-pay | Admitting: Cardiovascular Disease

## 2022-07-09 ENCOUNTER — Ambulatory Visit: Payer: Medicare HMO | Attending: Cardiovascular Disease | Admitting: Cardiovascular Disease

## 2022-07-09 VITALS — BP 130/80 | HR 79 | Ht 67.0 in | Wt 211.4 lb

## 2022-07-09 DIAGNOSIS — Z01818 Encounter for other preprocedural examination: Secondary | ICD-10-CM

## 2022-07-09 DIAGNOSIS — I350 Nonrheumatic aortic (valve) stenosis: Secondary | ICD-10-CM

## 2022-07-09 NOTE — Patient Instructions (Signed)
Medication Instructions:  Your physician recommends that you continue on your current medications as directed. Please refer to the Current Medication list given to you today.  *If you need a refill on your cardiac medications before your next appointment, please call your pharmacy*   Lab Work: BMET TODAY If you have labs (blood work) drawn today and your tests are completely normal, you will receive your results only by: [place] (if you have MyChart) OR A paper copy in the mail If you have any lab test that is abnormal or we need to change your treatment, we will call you to review the results.   Testing/Procedures: TAVR CT's (you will be called to schedule)  Surgical referral with [facility]   Follow-Up: At [facility], you and your health needs are our priority.  As part of our continuing mission to provide you with exceptional heart care, we have created designated Provider Care Teams.  These Care Teams include your primary Cardiologist (physician) and Advanced Practice Providers (APPs -  Physician Assistants and Nurse Practitioners) who all work together to provide you with the care you need, when you need it.  Your next appointment:   Structural Team will follow-up  The format for your next appointment:   In Person  Provider:   Sherren Mocha, MD       Important Information About Sugar

## 2022-07-09 NOTE — Progress Notes (Unsigned)
Cardiology Office Note:    Date:  07/10/2022   ID:  Traci [name], DOB 01/01/1941, MRN [id number]  PCP:  Burnard Bunting, MD   [place] Providers Cardiologist:  Sanda Klein, MD     Referring MD: Burnard Bunting, MD   Chief Complaint  Patient presents with   Shortness of Breath    History of Present Illness:    Traci [name] is a 81 y.o. female presenting today for TAVR evaluation, referred by Dr. Sallyanne Kuster.  The patient is here with her son today.  She has a longstanding heart murmur.  She has been followed for moderate aortic stenosis over time.  Recent echocardiogram showed progression of her aortic stenosis, gradient is moderate with a mean gradient of 29 mm and calculated aortic valve area of 1.2 cm.  Because of progressive symptoms, she was referred for cardiac catheterization which demonstrated a higher transaortic gradient of 37 mmHg mean.  She did not have any obstructive CAD.  The patient reports progressive symptoms of fatigue and exertional dyspnea.  She is short of breath with most activities now.  It takes her much longer to do any chores because she has to stop and rest frequently.  She is short of breath with walking and making her bed.  She admits to progression over the last 3 to 4 months, but her son thinks this has been coming on more slowly over the past 1 to 2 years.  She has not had chest pain or pressure.  She does have lightheadedness at times which has become more frequent.  No orthopnea, PND, or leg edema.  Because of her worsening symptoms and borderline moderate-severe aortic stenosis, she is referred for review of further diagnostic and treatment options.  The patient lives alone and is functionally independent.  She enjoys going to the mountains in [place] but this has become more difficult for her over the past year.  Her son lives nearby and is supportive.  The patient has been widowed for many years.  Past Medical History:   Diagnosis Date   GERD (gastroesophageal reflux disease)    High cholesterol    Hypertension    Irregular heart rate     Past Surgical History:  Procedure Laterality Date   CHOLECYSTECTOMY     RIGHT/LEFT HEART CATH AND CORONARY ANGIOGRAPHY N/A 06/20/2022   Procedure: RIGHT/LEFT HEART CATH AND CORONARY ANGIOGRAPHY;  Surgeon: Burnell Blanks, MD;  Location: [facility];  Service: Cardiovascular;  Laterality: N/A;    Current Medications: Current Meds  Medication Sig   aspirin EC 81 MG tablet Take 81 mg by mouth daily. Swallow whole.   atorvastatin (LIPITOR) 20 MG tablet Take 20 mg by mouth at bedtime.   cholecalciferol (VITAMIN D) 1000 UNITS tablet Take 1,000 Units by mouth daily.   lisinopril (ZESTRIL) 20 MG tablet TAKE 1 TABLET BY MOUTH EVERY DAY   pantoprazole (PROTONIX) 40 MG tablet Take 40 mg by mouth daily.       Allergies:   Patient has no known allergies.   Social History   Socioeconomic History   Marital status: Widowed    Spouse name: Not on file   Number of children: Not on file   Years of education: Not on file   Highest education level: Not on file  Occupational History   Not on file  Tobacco Use   Smoking status: Never   Smokeless tobacco: Never  Substance and Sexual Activity   Alcohol use: Yes  Comment: OCCASIONALLY   Drug use: No   Sexual activity: Not on file  Other Topics Concern   Not on file  Social History Narrative   Not on file   Social Determinants of Health   Financial Resource Strain: Not on file  Food Insecurity: Not on file  Transportation Needs: Not on file  Physical Activity: Not on file  Stress: Not on file  Social Connections: Not on file     Family History: The patient's family history includes Heart disease in her father. There is no history of Colon cancer.  ROS:   Please see the history of present illness.    All other systems reviewed and are negative.  EKGs/Labs/Other Studies Reviewed:    The  following studies were reviewed today: Echo: 1. Mild intracavitary gradient. Peak velocity 1.37 m/s. Peak gradient 8  mmHg. With Valsalva, peak velocity increased to 2.6 m/s, peak gradient 28  mmHg. Left ventricular ejection fraction, by estimation, is 60 to 65%. The  left ventricle has normal  function. The left ventricle has no regional wall motion abnormalities.  There is mild left ventricular hypertrophy of the septal segment. Left  ventricular diastolic parameters are consistent with Grade I diastolic  dysfunction (impaired relaxation). The  average left ventricular global longitudinal strain is -24.1 %. The global  longitudinal strain is normal.   2. Right ventricular systolic function is normal. The right ventricular  size is normal. There is normal pulmonary artery systolic pressure.   3. The mitral valve is normal in structure. Trivial mitral valve  regurgitation. No evidence of mitral stenosis.   4. The aortic valve is calcified. There is moderate calcification of the  aortic valve. There is moderate thickening of the aortic valve. Aortic  valve regurgitation is mild. Moderate aortic valve stenosis. Aortic valve  area, by VTI measures 1.28 cm.  Aortic valve mean gradient measures 29.0 mmHg. Aortic valve Vmax measures  3.44 m/s.   5. The inferior vena cava is normal in size with greater than 50%  respiratory variability, suggesting right atrial pressure of 3 mmHg.   Comparison(s): 05/25/20 EF 70-75%. GLS -24.1%. Mild-moderate AS 34mHg mean  PG, 343mg peak PG.   LEFT VENTRICLE  PLAX 2D  LVIDd:         3.60 cm   Diastology  LVIDs:         2.50 cm   LV e' medial:    8.16 cm/s  LV PW:         0.90 cm   LV E/e' medial:  9.7  LV IVS:        1.10 cm   LV e' lateral:   8.27 cm/s  LVOT diam:     2.20 cm   LV E/e' lateral: 9.6  LV SV:         98  LV SV Index:   47        2D Longitudinal Strain  LVOT Area:     3.80 cm  2D Strain GLS (A2C):   -20.3 %                           2D  Strain GLS (A3C):   -28.5 %                           2D Strain GLS (A4C):   -23.4 %  2D Strain GLS Avg:     -24.1 %                              3D Volume EF:                           3D EF:        55 %                           LV EDV:       106 ml                           LV ESV:       47 ml                           LV SV:        58 ml   RIGHT VENTRICLE             IVC  RV Basal diam:  3.00 cm     IVC diam: 1.80 cm  RV S prime:     15.40 cm/s  TAPSE (M-mode): 2.5 cm  RVSP:           18.4 mmHg   LEFT ATRIUM             Index        RIGHT ATRIUM           Index  LA diam:        3.90 cm 1.87 cm/m   RA Pressure: 3.00 mmHg  LA Vol (A2C):   44.4 ml 21.33 ml/m  RA Area:     15.40 cm  LA Vol (A4C):   46.1 ml 22.15 ml/m  RA Volume:   36.10 ml  17.35 ml/m  LA Biplane Vol: 48.7 ml 23.40 ml/m   AORTIC VALVE  AV Area (Vmax):    1.18 cm  AV Area (Vmean):   1.22 cm  AV Area (VTI):     1.28 cm  AV Vmax:           344.00 cm/s  AV Vmean:          237.750 cm/s  AV VTI:            0.767 m  AV Peak Grad:      47.3 mmHg  AV Mean Grad:      29.0 mmHg  LVOT Vmax:         107.00 cm/s  LVOT Vmean:        76.200 cm/s  LVOT VTI:          0.259 m  LVOT/AV VTI ratio: 0.34     AORTA  Ao Root diam: 3.30 cm  Ao Asc diam:  3.30 cm   MITRAL VALVE               TRICUSPID VALVE                             TR Peak grad:   15.4 mmHg  MV Decel Time: 194 msec    TR Vmax:        196.00 cm/s  MV E velocity: 79.00 cm/s  Estimated RAP:  3.00 mmHg  MV A velocity: 86.00 cm/s  RVSP:           18.4 mmHg  MV E/A ratio:  0.92                             SHUNTS                             Systemic VTI:  0.26 m                             Systemic Diam: 2.20 cm   Cath:   Prox RCA to Mid RCA lesion is 10% stenosed.   Prox Cx to Mid Cx lesion is 10% stenosed.   Ost LAD to Mid LAD lesion is 10% stenosed.   Mild non-obstructive CAD Moderately severe to severe aortic stenosis.  Peak to peak gradient 37.3 mmHg, mean gradient 36.7 mmHg.  Normal right and left heart pressures.  Normal cardiac output   Recommendations: Will review with Dr. Sallyanne Kuster. Given the fact that she is symptomatic and has AS that is bordering on severe, will decide between Progress trial and proceeding with TAVR.   EKG: EKG dated 07/06/2022 is reviewed and shows normal sinus rhythm 70 bpm, within normal limits.  Recent Labs: 06/14/2022: Platelets 251 06/20/2022: Hemoglobin 11.2 07/09/2022: BUN 13; Creatinine, Ser 1.00; Potassium 4.2; Sodium 144  Recent Lipid Panel    Component Value Date/Time   CHOL  02/11/2011 0349    135        ATP III CLASSIFICATION:  <200     mg/dL   Desirable  200-239  mg/dL   Borderline High  >=240    mg/dL   High          TRIG 66 02/11/2011 0349   HDL 60 02/11/2011 0349   CHOLHDL 2.3 02/11/2011 0349   VLDL 13 02/11/2011 0349   LDLCALC  02/11/2011 0349    62        Total Cholesterol/HDL:CHD Risk Coronary Heart Disease Risk Table                     Men   Women  1/2 Average Risk   3.4   3.3  Average Risk       5.0   4.4  2 X Average Risk   9.6   7.1  3 X Average Risk  23.4   11.0        Use the calculated Patient Ratio above and the CHD Risk Table to determine the patient's CHD Risk.        ATP III CLASSIFICATION (LDL):  <100     mg/dL   Optimal  100-129  mg/dL   Near or Above                    Optimal  130-159  mg/dL   Borderline  160-189  mg/dL   High  >190     mg/dL   Very High     Risk Assessment/Calculations:                Physical Exam:    VS:  BP 130/80   Pulse 79   Ht '5\' 7"'$  (1.702 m)   Wt 211 lb 6.4 oz (95.9 kg)   SpO2 95%   BMI 33.11 kg/m  Wt Readings from Last 3 Encounters:  07/09/22 211 lb 6.4 oz (95.9 kg)  07/06/22 212 lb 6.4 oz (96.3 kg)  06/20/22 209 lb (94.8 kg)     GEN:  Well nourished, well developed in no acute distress HEENT: Normal NECK: No JVD; No carotid bruits LYMPHATICS: No lymphadenopathy CARDIAC:  RRR, 3/6 harsh late peaking systolic crescendo decrescendo murmur at the right upper sternal border, A2 audible RESPIRATORY:  Clear to auscultation without rales, wheezing or rhonchi  ABDOMEN: Soft, non-tender, non-distended MUSCULOSKELETAL:  No edema; No deformity  SKIN: Warm and dry NEUROLOGIC:  Alert and oriented x 3 PSYCHIATRIC:  Normal affect   ASSESSMENT:    1. Pre-procedural examination   2. Aortic valve stenosis, nonrheumatic    PLAN:    In order of problems listed above:  The patient has severe, stage D1 aortic stenosis with New York Heart Association functional class III symptoms of exertional dyspnea and fatigue.  I have personally reviewed her echo and cardiac catheterization studies as part of her evaluation today.  Her echocardiogram demonstrates vigorous LV systolic function.  Her mean transvalvular gradient has increased from 19 mmHg to 29 mmHg over the past year and the peak velocity is now up to 3.6 m/s.  The aortic valve leaflets appear moderately calcified and severely restricted.  Dimensionless index is 0.34 and calculated aortic valve area is 1.18 cm.  By cardiac catheterization, the aortic valve is visibly calcified with restricted leaflet mobility.  There is no obstructive coronary artery disease present.  The directly measured mean transaortic gradient is 37 mmHg.  While the studies suggest moderately severe aortic stenosis, the patient's clinical symptoms are very typical for aortic stenosis with slow progression of exertional dyspnea, fatigue, and lightheadedness over the past year, now unable to do even her ADLs without symptoms of dyspnea.  There is no other explanation for this as she has no coronary artery disease, no history of smoking, known lung disease, and no significant lab abnormalities.  We discussed the natural history of aortic stenosis at length today.  We reviewed potential treatment options, including ongoing clinical surveillance, enrollment in a  clinical trial comparing clinical surveillance to TAVR, or moving forward with TAVR evaluation.  I have recommended proceeding with CTA studies of the heart as well as the chest, abdomen, and pelvis.  This will help define her treatment options further, evaluate anatomic suitability for TAVR, and better characterize her degree of aortic stenosis.  If the patient has appropriate anatomy for TAVR, I would recommend moving forward with cardiac surgical consultation as part of a multidisciplinary approach to her care.  Considering the fact that she has class III symptoms, it seems appropriate to treat her with TAVR to alleviate symptoms and improve quality of life.  As part of her evaluation today, I discussed the TAVR procedure with the patient and her son.  I reviewed the TAVR procedural animation, discussed procedural steps, reviewed potential complications, and discussed typical postop course.           Medication Adjustments/Labs and Tests Ordered: Current medicines are reviewed at length with the patient today.  Concerns regarding medicines are outlined above.  Orders Placed This Encounter  Procedures   Basic metabolic panel   No orders of the defined types were placed in this encounter.   Patient Instructions  Medication Instructions:  Your physician recommends that you continue on your current medications as directed. Please refer to the Current Medication list given to you today.  *If you  need a refill on your cardiac medications before your next appointment, please call your pharmacy*   Lab Work: BMET TODAY If you have labs (blood work) drawn today and your tests are completely normal, you will receive your results only by: [place] (if you have MyChart) OR A paper copy in the mail If you have any lab test that is abnormal or we need to change your treatment, we will call you to review the results.   Testing/Procedures: TAVR CT's (you will be called to  schedule)  Surgical referral with [facility]   Follow-Up: At [facility], you and your health needs are our priority.  As part of our continuing mission to provide you with exceptional heart care, we have created designated Provider Care Teams.  These Care Teams include your primary Cardiologist (physician) and Advanced Practice Providers (APPs -  Physician Assistants and Nurse Practitioners) who all work together to provide you with the care you need, when you need it.  Your next appointment:   Structural Team will follow-up  The format for your next appointment:   In Person  Provider:   Sherren Mocha, MD       Important Information About Sugar          Signed, Sherren Mocha, MD  07/10/2022 5:54 AM    [place]

## 2022-07-09 NOTE — Progress Notes (Addendum)
Pre Surgical Assessment: 5 M Walk Test  40M=16.29f  5 Meter Walk Test- trial 1: 6.70 seconds 5 Meter Walk Test- trial 2: 5.49 seconds 5 Meter Walk Test- trial 3: 6.23 seconds 5 Meter Walk Test Average: 6.14 seconds    Procedure Type: Isolated AVR Perioperative Outcome Estimate % Operative Mortality 2.04% Morbidity & Mortality 6.55% Stroke 1.17% Renal Failure 1.22% Reoperation 2.54% Prolonged Ventilation 3.46% Deep Sternal Wound Infection 0.056% [facility] 3.82% Short Hospital Stay (<6 days)* 40.6%

## 2022-07-10 ENCOUNTER — Encounter: Payer: Self-pay | Admitting: Cardiovascular Disease

## 2022-07-10 LAB — BASIC METABOLIC PANEL
BUN/Creatinine Ratio: 13 (ref 12–28)
BUN: 13 mg/dL (ref 8–27)
CO2: 26 mmol/L (ref 20–29)
Calcium: 9.1 mg/dL (ref 8.7–10.3)
Chloride: 105 mmol/L (ref 96–106)
Creatinine, Ser: 1 mg/dL (ref 0.57–1.00)
Glucose: 84 mg/dL (ref 70–99)
Potassium: 4.2 mmol/L (ref 3.5–5.2)
Sodium: 144 mmol/L (ref 134–144)
eGFR: 57 mL/min/{1.73_m2} — ABNORMAL LOW (ref >59)

## 2022-07-11 ENCOUNTER — Other Ambulatory Visit: Payer: Self-pay

## 2022-07-11 DIAGNOSIS — I350 Nonrheumatic aortic (valve) stenosis: Secondary | ICD-10-CM

## 2022-07-27 ENCOUNTER — Ambulatory Visit (HOSPITAL_COMMUNITY)
Admission: RE | Admit: 2022-07-27 | Discharge: 2022-07-27 | Disposition: A | Discharge status: Home / Self Care | Payer: Medicare HMO | Source: Ambulatory Visit | Attending: Cardiovascular Disease | Admitting: Cardiovascular Disease

## 2022-07-27 DIAGNOSIS — R911 Solitary pulmonary nodule: Secondary | ICD-10-CM | POA: Diagnosis not present

## 2022-07-27 DIAGNOSIS — I350 Nonrheumatic aortic (valve) stenosis: Secondary | ICD-10-CM

## 2022-07-27 DIAGNOSIS — Z01818 Encounter for other preprocedural examination: Secondary | ICD-10-CM | POA: Diagnosis not present

## 2022-07-27 MED ORDER — IOHEXOL 350 MG/ML SOLN
100.0000 mL | Freq: Once | INTRAVENOUS | Status: AC | PRN
  Administered 2022-07-27: 100 mL via INTRAVENOUS

## 2022-08-22 ENCOUNTER — Encounter: Payer: Self-pay | Admitting: Physician Assistant

## 2022-08-22 ENCOUNTER — Other Ambulatory Visit: Payer: Self-pay | Admitting: Physician Assistant

## 2022-08-22 DIAGNOSIS — I350 Nonrheumatic aortic (valve) stenosis: Secondary | ICD-10-CM

## 2022-08-26 NOTE — Progress Notes (Unsigned)
[address]       [place] [address]             [phone]           Traci [name] [place] Medical Record #[id number] Date of Birth: Nov 23, 1940  Sanda Klein, MD Burnard Bunting, MD  Chief Complaint:   No chief complaint on file.   History of Present Illness:         Past Medical History:  Diagnosis Date   GERD (gastroesophageal reflux disease)    High cholesterol    Hypertension    Irregular heart rate     Past Surgical History:  Procedure Laterality Date   CHOLECYSTECTOMY     RIGHT/LEFT HEART CATH AND CORONARY ANGIOGRAPHY N/A 06/20/2022   Procedure: RIGHT/LEFT HEART CATH AND CORONARY ANGIOGRAPHY;  Surgeon: Burnell Blanks, MD;  Location: [facility];  Service: Cardiovascular;  Laterality: N/A;    Social History   Tobacco Use  Smoking Status Never  Smokeless Tobacco Never    Social History   Substance and Sexual Activity  Alcohol Use Yes   Comment: OCCASIONALLY    Social History   Socioeconomic History   Marital status: Widowed    Spouse name: Not on file   Number of children: Not on file   Years of education: Not on file   Highest education level: Not on file  Occupational History   Not on file  Tobacco Use   Smoking status: Never   Smokeless tobacco: Never  Substance and Sexual Activity   Alcohol use: Yes    Comment: OCCASIONALLY   Drug use: No   Sexual activity: Not on file  Other Topics Concern   Not on file  Social History Narrative   Not on file   Social Determinants of Health   Financial Resource Strain: Not on file  Food Insecurity: Not on file  Transportation Needs: Not on file  Physical Activity: Not on file  Stress: Not on file  Social Connections: Not on file  Intimate Partner Violence: Not on file    No Known Allergies  Current Outpatient Medications  Medication Sig Dispense Refill   aspirin EC 81 MG tablet Take 81 mg by mouth daily. Swallow whole.     atorvastatin  (LIPITOR) 20 MG tablet Take 20 mg by mouth at bedtime.     cholecalciferol (VITAMIN D) 1000 UNITS tablet Take 1,000 Units by mouth daily.     lisinopril (ZESTRIL) 20 MG tablet TAKE 1 TABLET BY MOUTH EVERY DAY 90 tablet 2   pantoprazole (PROTONIX) 40 MG tablet Take 40 mg by mouth daily.       No current facility-administered medications for this visit.     Family History  Problem Relation Age of Onset   Heart disease Father    Colon cancer Neg Hx      [place] Physical Exam: There were no vitals taken for this visit.     Diagnostic Studies & Laboratory data: I have personally reviewed these studies and agree with the conclusions  TTE (04/10/22) IMPRESSIONS     1. Mild intracavitary gradient. Peak velocity 1.37 m/s. Peak gradient 8  mmHg. With Valsalva, peak velocity increased to 2.6 m/s, peak gradient 28  mmHg. Left ventricular ejection fraction, by estimation, is 60 to 65%. The  left ventricle has normal  function. The left ventricle has no regional wall motion abnormalities.  There is mild left ventricular hypertrophy of the septal segment.  Left  ventricular diastolic parameters are consistent with Grade I diastolic  dysfunction (impaired relaxation). The  average left ventricular global longitudinal strain is -24.1 %. The global  longitudinal strain is normal.   2. Right ventricular systolic function is normal. The right ventricular  size is normal. There is normal pulmonary artery systolic pressure.   3. The mitral valve is normal in structure. Trivial mitral valve  regurgitation. No evidence of mitral stenosis.   4. The aortic valve is calcified. There is moderate calcification of the  aortic valve. There is moderate thickening of the aortic valve. Aortic  valve regurgitation is mild. Moderate aortic valve stenosis. Aortic valve  area, by VTI measures 1.28 cm.  Aortic valve mean gradient measures 29.0 mmHg. Aortic valve Vmax measures  3.44 m/s.   5. The inferior vena  cava is normal in size with greater than 50%  respiratory variability, suggesting right atrial pressure of 3 mmHg.   Comparison(s): 05/25/20 EF 70-75%. GLS -24.1%. Mild-moderate AS 45mHg mean  PG, 3104mg peak PG.   CATH 06/20/22  Conclusion      Prox RCA to Mid RCA lesion is 10% stenosed.   Prox Cx to Mid Cx lesion is 10% stenosed.   Ost LAD to Mid LAD lesion is 10% stenosed.   Mild non-obstructive CAD Moderately severe to severe aortic stenosis. Peak to peak gradient 37.3 mmHg, mean gradient 36.7 mmHg.  Normal right and left heart pressures.  Normal cardiac output   Recommendations: Will review with Dr. CrSallyanne KusterGiven the fact that she is symptomatic and has AS that is bordering on severe, will decide between Progress trial and proceeding with TAVR.  CT SCAN (07/30/22)  FINDINGS: Image quality/Noise: Average image quality, motion artifact seen.   Aortic Valve:   Tricuspid aortic valve. Severely reduced cusp separation. Severely thickened, severely calcified aortic valve cusps.   AV calcium score: 1227   Virtual Basal Annulus Measurements:   Maximum/Minimum Diameter: 27.2 x 21.2 mm   Perimeter: 74.3 mm   Area:  414 mm2   No significant LVOT calcifications.   Membranous septal length: 5.5 mm   Based on these measurements, the annulus would be suitable for a 23 mm Sapien 3 valve. Alternatively, consider 29 mm Medtronic Evolut. Recommend Heart Team discussion for valve sizing.   Sinus of Valsalva Measurements:   Non-coronary:  31 mm   Right - coronary:  31 mm   Left - coronary:  31 mm   Sinus of Valsalva Height:   Left: 22.9 mm   Right: 26.2 mm   Aorta: Short segment common origin of brachiocephalic and left common carotid arteries off the aortic arch.   Sinotubular Junction:  27 mm   Ascending Thoracic Aorta:  30 mm   Aortic Arch:  24 mm   Descending Thoracic Aorta: Incompletely visualized on cardiac portion of exam.   Coronary Artery Height above  Annulus:   Left main: 18 mm   Right coronary: 17.7 mm   Coronary Arteries: Normal coronary origin. Right dominance. The study was performed without use of NTG and insufficient for plaque evaluation. Coronary artery calcium seen in LAD, RCA distribution.   Optimum Fluoroscopic Angle for Delivery: LAO 1 CAU 0   OTHER:   Atria: Biatrial chamber dilation (appears moderate).   Left atrial appendage: No thrombus.   Mitral valve: Grossly normal, no mitral annular calcifications.   Pulmonary artery: Normal caliber main pulmonary artery with mild dilation of right and left branch pulmonary arteries.   Pulmonary  veins: Normal anatomy.   IMPRESSION: 1. Tricuspid aortic valve with severely reduced cusp excursion. Severely thickened and severely calcified aortic valve cusps.   2.  Aortic valve calcium score: 1227   3. Annulus area: 414 mm2, suitable for 23 mm Sapien 3 valve. No LVOT calcifications.   4.  Sufficient coronary artery heights from annulus.   5.  Optimum fluoroscopic angle for delivery: LAO 1 CAU 0  Recent Radiology Findings:   No results found.    Recent Lab Findings: Lab Results  Component Value Date   WBC 14.5 (H) 06/14/2022   HGB 11.2 (L) 06/20/2022   HCT 33.0 (L) 06/20/2022   PLT 251 06/14/2022   GLUCOSE 84 07/09/2022   CHOL  02/11/2011    135        ATP III CLASSIFICATION:  <200     mg/dL   Desirable  200-239  mg/dL   Borderline High  >=240    mg/dL   High          TRIG 66 02/11/2011   HDL 60 02/11/2011   LDLCALC  02/11/2011    62        Total Cholesterol/HDL:CHD Risk Coronary Heart Disease Risk Table                     Men   Women  1/2 Average Risk   3.4   3.3  Average Risk       5.0   4.4  2 X Average Risk   9.6   7.1  3 X Average Risk  23.4   11.0        Use the calculated Patient Ratio above and the CHD Risk Table to determine the patient's CHD Risk.        ATP III CLASSIFICATION (LDL):  <100     mg/dL   Optimal  100-129  mg/dL    Near or Above                    Optimal  130-159  mg/dL   Borderline  160-189  mg/dL   High  >190     mg/dL   Very High   ALT 23 02/16/2011   AST 16 02/16/2011   NA 144 07/09/2022   K 4.2 07/09/2022   CL 105 07/09/2022   CREATININE 1.00 07/09/2022   BUN 13 07/09/2022   CO2 26 07/09/2022   TSH 0.782 02/10/2011   INR 1.03 02/10/2011   HGBA1C  02/10/2011    5.6 (NOTE)                                                                       According to the ADA Clinical Practice Recommendations for 2011, when HbA1c is used as a screening test:   >=6.5%   Diagnostic of Diabetes Mellitus           (if abnormal result  is confirmed)  5.7-6.4%   Increased risk of developing Diabetes Mellitus  References:Diagnosis and Classification of Diabetes Mellitus,Diabetes GQQP,6195,09(TOIZT 1):S62-S69 and Standards of Medical Care in         Diabetes - 2011,Diabetes Care,2011,34  (Suppl 1):S11-S61.      Assessment / Plan:  [name] 08/26/2022 3:16 PM

## 2022-08-27 ENCOUNTER — Encounter: Payer: Self-pay | Admitting: Thoracic Surgery (Cardiothoracic Vascular Surgery)

## 2022-08-27 ENCOUNTER — Institutional Professional Consult (permissible substitution): Payer: Medicare HMO | Admitting: Thoracic Surgery (Cardiothoracic Vascular Surgery)

## 2022-08-27 VITALS — BP 180/77 | HR 77 | Resp 20 | Ht 67.0 in | Wt 208.7 lb

## 2022-08-27 DIAGNOSIS — I350 Nonrheumatic aortic (valve) stenosis: Secondary | ICD-10-CM | POA: Diagnosis not present

## 2022-08-27 NOTE — Patient Instructions (Signed)
Will be called with date for surgery by structural heart team coordinators

## 2022-08-29 ENCOUNTER — Other Ambulatory Visit: Payer: Self-pay

## 2022-08-29 DIAGNOSIS — I350 Nonrheumatic aortic (valve) stenosis: Secondary | ICD-10-CM

## 2022-08-31 ENCOUNTER — Ambulatory Visit (HOSPITAL_COMMUNITY)
Admission: RE | Admit: 2022-08-31 | Discharge: 2022-08-31 | Disposition: A | Discharge status: Home / Self Care | Payer: Medicare HMO | Source: Ambulatory Visit | Attending: Physician Assistant | Admitting: Physician Assistant

## 2022-08-31 ENCOUNTER — Encounter (HOSPITAL_COMMUNITY)
Admission: RE | Admit: 2022-08-31 | Discharge: 2022-08-31 | Disposition: A | Discharge status: Home / Self Care | Payer: Medicare HMO | Source: Ambulatory Visit | Attending: Cardiovascular Disease | Admitting: Cardiovascular Disease

## 2022-08-31 DIAGNOSIS — I350 Nonrheumatic aortic (valve) stenosis: Secondary | ICD-10-CM | POA: Insufficient documentation

## 2022-08-31 DIAGNOSIS — Z01818 Encounter for other preprocedural examination: Secondary | ICD-10-CM | POA: Insufficient documentation

## 2022-08-31 DIAGNOSIS — Z1152 Encounter for screening for COVID-19: Secondary | ICD-10-CM | POA: Diagnosis not present

## 2022-08-31 LAB — SURGICAL PCR SCREEN
MRSA, PCR: NEGATIVE
Staphylococcus aureus: NEGATIVE

## 2022-08-31 LAB — CBC
HCT: 40.2 % (ref 36.0–46.0)
Hemoglobin: 13.1 g/dL (ref 12.0–15.0)
MCH: 30 pg (ref 26.0–34.0)
MCHC: 32.6 g/dL (ref 30.0–36.0)
MCV: 92.2 fL (ref 80.0–100.0)
Platelets: 243 10*3/uL (ref 150–400)
RBC: 4.36 MIL/uL (ref 3.87–5.11)
RDW: 13.2 % (ref 11.5–15.5)
WBC: 6.8 10*3/uL (ref 4.0–10.5)
nRBC: 0 % (ref 0.0–0.2)

## 2022-08-31 LAB — COMPREHENSIVE METABOLIC PANEL
ALT: 14 U/L (ref 0–44)
AST: 16 U/L (ref 15–41)
Albumin: 3.8 g/dL (ref 3.5–5.0)
Alkaline Phosphatase: 65 U/L (ref 38–126)
Anion gap: 11 (ref 5–15)
BUN: 13 mg/dL (ref 8–23)
CO2: 28 mmol/L (ref 22–32)
Calcium: 9.4 mg/dL (ref 8.9–10.3)
Chloride: 104 mmol/L (ref 98–111)
Creatinine, Ser: 0.96 mg/dL (ref 0.44–1.00)
GFR, Estimated: 59 mL/min — ABNORMAL LOW (ref >60)
Glucose, Bld: 92 mg/dL (ref 70–99)
Potassium: 3.6 mmol/L (ref 3.5–5.1)
Sodium: 143 mmol/L (ref 135–145)
Total Bilirubin: 0.5 mg/dL (ref 0.3–1.2)
Total Protein: 6.7 g/dL (ref 6.5–8.1)

## 2022-08-31 LAB — URINALYSIS, ROUTINE W REFLEX MICROSCOPIC
Bilirubin Urine: NEGATIVE
Glucose, UA: NEGATIVE mg/dL
Ketones, ur: NEGATIVE mg/dL
Nitrite: NEGATIVE
Protein, ur: NEGATIVE mg/dL
Specific Gravity, Urine: 1.01 (ref 1.005–1.030)
pH: 6 (ref 5.0–8.0)

## 2022-08-31 LAB — TYPE AND SCREEN
ABO/RH(D): A POS
Antibody Screen: NEGATIVE

## 2022-08-31 LAB — PROTIME-INR
INR: 1 (ref 0.8–1.2)
Prothrombin Time: 13.5 seconds (ref 11.4–15.2)

## 2022-08-31 LAB — SARS CORONAVIRUS 2 (TAT 6-24 HRS): SARS Coronavirus 2: NEGATIVE

## 2022-08-31 NOTE — Progress Notes (Addendum)
Patient arrived for PAT lab appt, received CHG soap and instructions. All consents/blood forms have been signed and lab work drawn. PCR and COVID swab done. EKG and CXR completed. Notified Traci [name] about abnormal UA.

## 2022-09-03 MED ORDER — POTASSIUM CHLORIDE 2 MEQ/ML IV SOLN
80.0000 meq | INTRAVENOUS | Status: DC
  Filled 2022-09-03: qty 40

## 2022-09-03 MED ORDER — NOREPINEPHRINE 4 MG/250ML-% IV SOLN
0.0000 ug/min | INTRAVENOUS | Status: AC
  Administered 2022-09-04: 1 ug/min via INTRAVENOUS
  Filled 2022-09-03: qty 250

## 2022-09-03 MED ORDER — DEXMEDETOMIDINE HCL IN NACL 400 MCG/100ML IV SOLN
0.1000 ug/kg/h | INTRAVENOUS | Status: AC
  Administered 2022-09-04: 94.72 ug via INTRAVENOUS
  Filled 2022-09-03: qty 100

## 2022-09-03 MED ORDER — MAGNESIUM SULFATE 50 % IJ SOLN
40.0000 meq | INTRAMUSCULAR | Status: DC
  Filled 2022-09-03: qty 9.85

## 2022-09-03 MED ORDER — HEPARIN 30,000 UNITS/1000 ML (OHS) CELLSAVER SOLUTION
Status: DC
  Filled 2022-09-03: qty 1000

## 2022-09-03 MED ORDER — CEFAZOLIN SODIUM-DEXTROSE 2-4 GM/100ML-% IV SOLN
2.0000 g | INTRAVENOUS | Status: AC
  Administered 2022-09-04: 2 g via INTRAVENOUS
  Filled 2022-09-03: qty 100

## 2022-09-03 NOTE — H&P (Signed)
[facility] Record #[id number] Date of Birth: 1941-09-11   Sanda Klein, MD Burnard Bunting, MD   Chief Complaint:   Fatigue and DOE and lightheadedness   History of Present Illness:     Pt is a very pleasant 81 yo wf who has been suffering from progressive fatigue, DOE and lightheadedness (not syncope) for the past several years but more significantly the past several months. She is no longer able to perform her daily activities and finds it very difficult to visit the [place] which she loves. She has known AS and has been followed with progressive worsening of her gradients with the mean increasing from 12mHg to 227mg with a pk velocity of 3.32m9m She because of her symptoms and worsening AS was taken to the cath lab where no CAD was found and a direct trans aortic pressure gradient of 4m72mwas found. She has normal ventricular function and her valve by echo appears thickened and without much excursion.           Past Medical History:  Diagnosis Date   GERD (gastroesophageal reflux disease)     High cholesterol     Hypertension     Irregular heart rate             Past Surgical History:  Procedure Laterality Date   CHOLECYSTECTOMY       RIGHT/LEFT HEART CATH AND CORONARY ANGIOGRAPHY N/A 06/20/2022    Procedure: RIGHT/LEFT HEART CATH AND CORONARY ANGIOGRAPHY;  Surgeon: McAlBurnell Blanks;  [facility];  Service: Cardiovascular;  Laterality: N/A;      Social History       Tobacco Use  Smoking Status Never  Smokeless Tobacco Never    Social History        Substance and Sexual Activity  Alcohol Use Yes    Comment: OCCASIONALLY      Social History         Socioeconomic History   Marital status: Widowed      Spouse name: Not on file   Number of children: Not on file   Years of education: Not on file   Highest education level: Not on file  Occupational History   Not on file  Tobacco Use   Smoking  status: Never   Smokeless tobacco: Never  Substance and Sexual Activity   Alcohol use: Yes      Comment: OCCASIONALLY   Drug use: No   Sexual activity: Not on file  Other Topics Concern   Not on file  Social History Narrative   Not on file    Social Determinants of Health    Financial Resource Strain: Not on file  Food Insecurity: Not on file  Transportation Needs: Not on file  Physical Activity: Not on file  Stress: Not on file  Social Connections: Not on file  Intimate Partner Violence: Not on file      No Known Allergies         Current Outpatient Medications  Medication Sig Dispense Refill   aspirin EC 81 MG tablet Take 81 mg by mouth daily. Swallow whole.       atorvastatin (LIPITOR) 20 MG tablet Take 20 mg by mouth at bedtime.       cholecalciferol (VITAMIN D) 1000 UNITS tablet Take 1,000 Units by mouth daily.       lisinopril (ZESTRIL) 20 MG tablet TAKE 1 TABLET BY MOUTH EVERY DAY 90 tablet 2   pantoprazole (PROTONIX)  40 MG tablet Take 40 mg by mouth daily.          No current facility-administered medications for this visit.             Family History  Problem Relation Age of Onset   Heart disease Father     Colon cancer Neg Hx          [facility] Physical Exam: There were no vitals taken for this visit. EXAM Lungs: clear Card: RR with a 3/6 sem Abd: soft Ext: no edema Teeth in good repair       Diagnostic Studies & Laboratory data: I have personally reviewed these studies and agree with the conclusions   TTE (04/10/22) IMPRESSIONS     1. Mild intracavitary gradient. Peak velocity 1.37 m/s. Peak gradient 8  mmHg. With Valsalva, peak velocity increased to 2.6 m/s, peak gradient 28  mmHg. Left ventricular ejection fraction, by estimation, is 60 to 65%. The  left ventricle has normal  function. The left ventricle has no regional wall motion abnormalities.  There is mild left ventricular hypertrophy of the septal segment. Left  ventricular diastolic  parameters are consistent with Grade I diastolic  dysfunction (impaired relaxation). The  average left ventricular global longitudinal strain is -24.1 %. The global  longitudinal strain is normal.   2. Right ventricular systolic function is normal. The right ventricular  size is normal. There is normal pulmonary artery systolic pressure.   3. The mitral valve is normal in structure. Trivial mitral valve  regurgitation. No evidence of mitral stenosis.   4. The aortic valve is calcified. There is moderate calcification of the  aortic valve. There is moderate thickening of the aortic valve. Aortic  valve regurgitation is mild. Moderate aortic valve stenosis. Aortic valve  area, by VTI measures 1.28 cm.  Aortic valve mean gradient measures 29.0 mmHg. Aortic valve Vmax measures  3.44 m/s.   5. The inferior vena cava is normal in size with greater than 50%  respiratory variability, suggesting right atrial pressure of 3 mmHg.   Comparison(s): 05/25/20 EF 70-75%. GLS -24.1%. Mild-moderate AS 71mHg mean  PG, 352mg peak PG.    CATH 06/20/22  Conclusion       Prox RCA to Mid RCA lesion is 10% stenosed.   Prox Cx to Mid Cx lesion is 10% stenosed.   Ost LAD to Mid LAD lesion is 10% stenosed.   Mild non-obstructive CAD Moderately severe to severe aortic stenosis. Peak to peak gradient 37.3 mmHg, mean gradient 36.7 mmHg.  Normal right and left heart pressures.  Normal cardiac output   Recommendations: Will review with Dr. CrSallyanne KusterGiven the fact that she is symptomatic and has AS that is bordering on severe, will decide between Progress trial and proceeding with TAVR.  CT SCAN (07/30/22)  FINDINGS: Image quality/Noise: Average image quality, motion artifact seen.   Aortic Valve:   Tricuspid aortic valve. Severely reduced cusp separation. Severely thickened, severely calcified aortic valve cusps.   AV calcium score: 1227   Virtual Basal Annulus Measurements:   Maximum/Minimum Diameter:  27.2 x 21.2 mm   Perimeter: 74.3 mm   Area:  414 mm2   No significant LVOT calcifications.   Membranous septal length: 5.5 mm   Based on these measurements, the annulus would be suitable for a 23 mm Sapien 3 valve. Alternatively, consider 29 mm Medtronic Evolut. Recommend Heart Team discussion for valve sizing.   Sinus of Valsalva Measurements:   Non-coronary:  31 mm  Right - coronary:  31 mm   Left - coronary:  31 mm   Sinus of Valsalva Height:   Left: 22.9 mm   Right: 26.2 mm   Aorta: Short segment common origin of brachiocephalic and left common carotid arteries off the aortic arch.   Sinotubular Junction:  27 mm   Ascending Thoracic Aorta:  30 mm   Aortic Arch:  24 mm   Descending Thoracic Aorta: Incompletely visualized on cardiac portion of exam.   Coronary Artery Height above Annulus:   Left main: 18 mm   Right coronary: 17.7 mm   Coronary Arteries: Normal coronary origin. Right dominance. The study was performed without use of NTG and insufficient for plaque evaluation. Coronary artery calcium seen in LAD, RCA distribution.   Optimum Fluoroscopic Angle for Delivery: LAO 1 CAU 0   OTHER:   Atria: Biatrial chamber dilation (appears moderate).   Left atrial appendage: No thrombus.   Mitral valve: Grossly normal, no mitral annular calcifications.   Pulmonary artery: Normal caliber main pulmonary artery with mild dilation of right and left branch pulmonary arteries.   Pulmonary veins: Normal anatomy.   IMPRESSION: 1. Tricuspid aortic valve with severely reduced cusp excursion. Severely thickened and severely calcified aortic valve cusps.   2.  Aortic valve calcium score: 1227   3. Annulus area: 414 mm2, suitable for 23 mm Sapien 3 valve. No LVOT calcifications.   4.  Sufficient coronary artery heights from annulus.   5.  Optimum fluoroscopic angle for delivery: LAO 1 CAU 0  Recent Radiology Findings:    Imaging Results (Last 48 hours)   No results found.       Recent Lab Findings: Recent Labs        Lab Results  Component Value Date    WBC 14.5 (H) 06/14/2022    HGB 11.2 (L) 06/20/2022    HCT 33.0 (L) 06/20/2022    PLT 251 06/14/2022    GLUCOSE 84 07/09/2022    CHOL   02/11/2011      135        ATP III CLASSIFICATION:  <200     mg/dL   Desirable  200-239  mg/dL   Borderline High  >=240    mg/dL   High           TRIG 66 02/11/2011    HDL 60 02/11/2011    LDLCALC   02/11/2011      62        Total Cholesterol/HDL:CHD Risk Coronary Heart Disease Risk Table                     Men   Women  1/2 Average Risk   3.4   3.3  Average Risk       5.0   4.4  2 X Average Risk   9.6   7.1  3 X Average Risk  23.4   11.0        Use the calculated Patient Ratio above and the CHD Risk Table to determine the patient's CHD Risk.        ATP III CLASSIFICATION (LDL):  <100     mg/dL   Optimal  100-129  mg/dL   Near or Above                    Optimal  130-159  mg/dL   Borderline  160-189  mg/dL   High  >190     mg/dL  Very High    ALT 23 02/16/2011    AST 16 02/16/2011    NA 144 07/09/2022    K 4.2 07/09/2022    CL 105 07/09/2022    CREATININE 1.00 07/09/2022    BUN 13 07/09/2022    CO2 26 07/09/2022    TSH 0.782 02/10/2011    INR 1.03 02/10/2011    HGBA1C   02/10/2011      5.6 (NOTE)                                                                       According to the ADA Clinical Practice Recommendations for 2011, when HbA1c is used as a screening test:   >=6.5%   Diagnostic of Diabetes Mellitus           (if abnormal result  is confirmed)  5.7-6.4%   Increased risk of developing Diabetes Mellitus  References:Diagnosis and Classification of Diabetes Mellitus,Diabetes MAUQ,3335,45(GYBWL 1):S62-S69 and Standards of Medical Care in         Diabetes - 2011,Diabetes Care,2011,34  (Suppl 1):S11-S61.            Assessment / Plan:     Severe AS   Pt with NYHA class III symptoms from moderate to severe AS by  measurements with normal LV function. She has no other etioligies to explain her symptoms which are classic for her AS. I believe secondary to her age that TAVR would be best option moving forward and I have discussed the procedure with her and her son present and they understand and wish to proceed. They understand the risks of vessel injury, need for PPM, stroke and death and that if complications arise that are amenable to surgical repair, this would be offered. After review of her CTA I believe Right TF access with a 23 mm Sapien (with possible need for an additional cc if needed at time of expansion) Over 60 min were spent in review of her records, reviewing studies, face to face with pt and in coordination of future care

## 2022-09-04 ENCOUNTER — Other Ambulatory Visit: Payer: Self-pay | Admitting: Physician Assistant

## 2022-09-04 ENCOUNTER — Inpatient Hospital Stay (HOSPITAL_COMMUNITY): Payer: Medicare HMO | Admitting: Certified Registered Nurse Anesthetist

## 2022-09-04 ENCOUNTER — Other Ambulatory Visit: Payer: Self-pay

## 2022-09-04 ENCOUNTER — Encounter (HOSPITAL_COMMUNITY): Payer: Self-pay | Admitting: Cardiovascular Disease

## 2022-09-04 ENCOUNTER — Inpatient Hospital Stay (HOSPITAL_COMMUNITY)
Admission: RE | Admit: 2022-09-04 | Discharge: 2022-09-06 | DRG: 266 | Disposition: A | Discharge status: Home Health | Payer: Medicare HMO | Attending: Thoracic Surgery (Cardiothoracic Vascular Surgery) | Admitting: Thoracic Surgery (Cardiothoracic Vascular Surgery)

## 2022-09-04 ENCOUNTER — Inpatient Hospital Stay (HOSPITAL_COMMUNITY): Payer: Medicare HMO

## 2022-09-04 ENCOUNTER — Encounter (HOSPITAL_COMMUNITY)
Admission: RE | Disposition: A | Discharge status: Home Health | Payer: Self-pay | Source: Home / Self Care | Attending: Thoracic Surgery (Cardiothoracic Vascular Surgery)

## 2022-09-04 ENCOUNTER — Inpatient Hospital Stay (HOSPITAL_COMMUNITY): Payer: Medicare HMO | Admitting: Physician Assistant

## 2022-09-04 DIAGNOSIS — Z952 Presence of prosthetic heart valve: Secondary | ICD-10-CM

## 2022-09-04 DIAGNOSIS — Z6832 Body mass index (BMI) 32.0-32.9, adult: Secondary | ICD-10-CM

## 2022-09-04 DIAGNOSIS — I10 Essential (primary) hypertension: Secondary | ICD-10-CM

## 2022-09-04 DIAGNOSIS — I350 Nonrheumatic aortic (valve) stenosis: Secondary | ICD-10-CM | POA: Diagnosis not present

## 2022-09-04 DIAGNOSIS — Z8249 Family history of ischemic heart disease and other diseases of the circulatory system: Secondary | ICD-10-CM

## 2022-09-04 DIAGNOSIS — D62 Acute posthemorrhagic anemia: Secondary | ICD-10-CM | POA: Diagnosis not present

## 2022-09-04 DIAGNOSIS — E669 Obesity, unspecified: Secondary | ICD-10-CM | POA: Diagnosis present

## 2022-09-04 DIAGNOSIS — E785 Hyperlipidemia, unspecified: Secondary | ICD-10-CM | POA: Diagnosis present

## 2022-09-04 DIAGNOSIS — Z7982 Long term (current) use of aspirin: Secondary | ICD-10-CM

## 2022-09-04 DIAGNOSIS — Y658 Other specified misadventures during surgical and medical care: Secondary | ICD-10-CM | POA: Diagnosis not present

## 2022-09-04 DIAGNOSIS — I9788 Other intraoperative complications of the circulatory system, not elsewhere classified: Secondary | ICD-10-CM | POA: Diagnosis not present

## 2022-09-04 DIAGNOSIS — E7800 Pure hypercholesterolemia, unspecified: Secondary | ICD-10-CM | POA: Diagnosis present

## 2022-09-04 DIAGNOSIS — I2510 Atherosclerotic heart disease of native coronary artery without angina pectoris: Secondary | ICD-10-CM | POA: Diagnosis not present

## 2022-09-04 DIAGNOSIS — K219 Gastro-esophageal reflux disease without esophagitis: Secondary | ICD-10-CM | POA: Diagnosis present

## 2022-09-04 DIAGNOSIS — I7777 Dissection of artery of lower extremity: Secondary | ICD-10-CM | POA: Diagnosis not present

## 2022-09-04 DIAGNOSIS — Y92234 Operating room of hospital as the place of occurrence of the external cause: Secondary | ICD-10-CM | POA: Diagnosis not present

## 2022-09-04 DIAGNOSIS — Z79899 Other long term (current) drug therapy: Secondary | ICD-10-CM

## 2022-09-04 DIAGNOSIS — I724 Aneurysm of artery of lower extremity: Secondary | ICD-10-CM | POA: Diagnosis not present

## 2022-09-04 DIAGNOSIS — N1330 Unspecified hydronephrosis: Secondary | ICD-10-CM | POA: Diagnosis present

## 2022-09-04 DIAGNOSIS — Z006 Encounter for examination for normal comparison and control in clinical research program: Secondary | ICD-10-CM

## 2022-09-04 HISTORY — PX: PATCH ANGIOPLASTY: SHX6230

## 2022-09-04 HISTORY — PX: ULTRASOUND GUIDANCE FOR VASCULAR ACCESS: SHX6516

## 2022-09-04 HISTORY — PX: ENDARTERECTOMY FEMORAL: SHX5804

## 2022-09-04 HISTORY — DX: Presence of prosthetic heart valve: Z95.2

## 2022-09-04 HISTORY — PX: GROIN DISSECTION: SHX5250

## 2022-09-04 HISTORY — PX: APPLICATION OF WOUND VAC: SHX5189

## 2022-09-04 HISTORY — PX: TRANSCATHETER AORTIC VALVE REPLACEMENT, TRANSFEMORAL: SHX6400

## 2022-09-04 HISTORY — PX: INTRAOPERATIVE TRANSTHORACIC ECHOCARDIOGRAM: SHX6523

## 2022-09-04 LAB — POCT I-STAT, CHEM 8
BUN: 16 mg/dL (ref 8–23)
BUN: 14 mg/dL (ref 8–23)
Calcium, Ion: 1.25 mmol/L (ref 1.15–1.40)
Calcium, Ion: 1.26 mmol/L (ref 1.15–1.40)
Chloride: 104 mmol/L (ref 98–111)
Chloride: 107 mmol/L (ref 98–111)
Creatinine, Ser: 0.8 mg/dL (ref 0.44–1.00)
Creatinine, Ser: 0.8 mg/dL (ref 0.44–1.00)
Glucose, Bld: 139 mg/dL — ABNORMAL HIGH (ref 70–99)
Glucose, Bld: 128 mg/dL — ABNORMAL HIGH (ref 70–99)
HCT: 33 % — ABNORMAL LOW (ref 36.0–46.0)
HCT: 27 % — ABNORMAL LOW (ref 36.0–46.0)
Hemoglobin: 11.2 g/dL — ABNORMAL LOW (ref 12.0–15.0)
Hemoglobin: 9.2 g/dL — ABNORMAL LOW (ref 12.0–15.0)
Potassium: 3.7 mmol/L (ref 3.5–5.1)
Potassium: 3.6 mmol/L (ref 3.5–5.1)
Sodium: 142 mmol/L (ref 135–145)
Sodium: 146 mmol/L — ABNORMAL HIGH (ref 135–145)
TCO2: 28 mmol/L (ref 22–32)
TCO2: 25 mmol/L (ref 22–32)

## 2022-09-04 LAB — ECHOCARDIOGRAM LIMITED
AR max vel: 3.1 cm2
AV Area VTI: 3.3 cm2
AV Area mean vel: 1.84 cm2
AV Mean grad: 3 mmHg
AV Peak grad: 7.6 mmHg
Ao pk vel: 1.38 m/s

## 2022-09-04 LAB — POCT ACTIVATED CLOTTING TIME
Activated Clotting Time: 203 seconds
Activated Clotting Time: 209 seconds
Activated Clotting Time: 221 seconds
Activated Clotting Time: 143 seconds

## 2022-09-04 LAB — ABO/RH: ABO/RH(D): A POS

## 2022-09-04 SURGERY — IMPLANTATION, AORTIC VALVE, TRANSCATHETER, FEMORAL APPROACH
Anesthesia: General | Site: Groin | Laterality: Right

## 2022-09-04 MED ORDER — HEPARIN SODIUM (PORCINE) 1000 UNIT/ML IJ SOLN
INTRAMUSCULAR | Status: DC | PRN
  Administered 2022-09-04: 2000 [IU] via INTRAVENOUS
  Administered 2022-09-04: 8000 [IU] via INTRAVENOUS
  Administered 2022-09-04: 14000 [IU] via INTRAVENOUS
  Administered 2022-09-04: 1000 [IU] via INTRAVENOUS

## 2022-09-04 MED ORDER — FENTANYL CITRATE (PF) 250 MCG/5ML IJ SOLN
INTRAMUSCULAR | Status: AC
  Filled 2022-09-04: qty 5

## 2022-09-04 MED ORDER — CHLORHEXIDINE GLUCONATE 4 % EX LIQD: 60.0000 mL | Freq: Once | CUTANEOUS | Status: DC

## 2022-09-04 MED ORDER — PROPOFOL 1000 MG/100ML IV EMUL
INTRAVENOUS | Status: AC
  Filled 2022-09-04: qty 100

## 2022-09-04 MED ORDER — ORAL CARE MOUTH RINSE: 15.0000 mL | Freq: Once | OROMUCOSAL | Status: AC

## 2022-09-04 MED ORDER — MORPHINE SULFATE (PF) 2 MG/ML IV SOLN: 1.0000 mg | INTRAVENOUS | Status: DC | PRN

## 2022-09-04 MED ORDER — DEXAMETHASONE SODIUM PHOSPHATE 10 MG/ML IJ SOLN
INTRAMUSCULAR | Status: AC
  Filled 2022-09-04: qty 1

## 2022-09-04 MED ORDER — PHENYLEPHRINE 80 MCG/ML (10ML) SYRINGE FOR IV PUSH (FOR BLOOD PRESSURE SUPPORT)
PREFILLED_SYRINGE | INTRAVENOUS | Status: DC | PRN
  Administered 2022-09-04: 40 ug via INTRAVENOUS
  Administered 2022-09-04: 160 ug via INTRAVENOUS
  Administered 2022-09-04: 80 ug via INTRAVENOUS
  Administered 2022-09-04 (×4): 40 ug via INTRAVENOUS

## 2022-09-04 MED ORDER — LIDOCAINE HCL 1 % IJ SOLN
INTRAMUSCULAR | Status: DC | PRN
  Administered 2022-09-04: 10 mL

## 2022-09-04 MED ORDER — FENTANYL CITRATE (PF) 250 MCG/5ML IJ SOLN
INTRAMUSCULAR | Status: DC | PRN
  Administered 2022-09-04: 50 ug via INTRAVENOUS
  Administered 2022-09-04: 25 ug via INTRAVENOUS

## 2022-09-04 MED ORDER — ROCURONIUM BROMIDE 10 MG/ML (PF) SYRINGE
PREFILLED_SYRINGE | INTRAVENOUS | Status: AC
  Filled 2022-09-04: qty 10

## 2022-09-04 MED ORDER — ASPIRIN 81 MG PO TBEC
81.0000 mg | DELAYED_RELEASE_TABLET | Freq: Every day | ORAL | Status: DC
  Administered 2022-09-04 – 2022-09-06 (×3): 81 mg via ORAL
  Filled 2022-09-04 (×3): qty 1

## 2022-09-04 MED ORDER — CALCIUM CHLORIDE 10 % IV SOLN
INTRAVENOUS | Status: AC
  Filled 2022-09-04: qty 10

## 2022-09-04 MED ORDER — DEXAMETHASONE SODIUM PHOSPHATE 10 MG/ML IJ SOLN
INTRAMUSCULAR | Status: DC | PRN
  Administered 2022-09-04: 4 mg via INTRAVENOUS

## 2022-09-04 MED ORDER — SODIUM CHLORIDE 0.9% FLUSH
3.0000 mL | Freq: Two times a day (BID) | INTRAVENOUS | Status: DC
  Administered 2022-09-04 – 2022-09-05 (×3): 3 mL via INTRAVENOUS

## 2022-09-04 MED ORDER — ACETAMINOPHEN 650 MG RE SUPP: 650.0000 mg | Freq: Four times a day (QID) | RECTAL | Status: DC | PRN

## 2022-09-04 MED ORDER — CHLORHEXIDINE GLUCONATE 0.12 % MT SOLN
15.0000 mL | Freq: Once | OROMUCOSAL | Status: AC
  Administered 2022-09-04: 15 mL via OROMUCOSAL

## 2022-09-04 MED ORDER — CHLORHEXIDINE GLUCONATE 0.12 % MT SOLN
15.0000 mL | Freq: Once | OROMUCOSAL | Status: DC
  Filled 2022-09-04: qty 15

## 2022-09-04 MED ORDER — ATORVASTATIN CALCIUM 10 MG PO TABS
20.0000 mg | ORAL_TABLET | Freq: Every day | ORAL | Status: DC
  Administered 2022-09-04 – 2022-09-05 (×2): 20 mg via ORAL
  Filled 2022-09-04 (×2): qty 2

## 2022-09-04 MED ORDER — LACTATED RINGERS IV SOLN: INTRAVENOUS | Status: DC

## 2022-09-04 MED ORDER — PROPOFOL 500 MG/50ML IV EMUL
INTRAVENOUS | Status: DC | PRN
  Administered 2022-09-04: 10 ug/kg/min via INTRAVENOUS

## 2022-09-04 MED ORDER — PANTOPRAZOLE SODIUM 40 MG PO TBEC
40.0000 mg | DELAYED_RELEASE_TABLET | Freq: Every day | ORAL | Status: DC
  Administered 2022-09-04 – 2022-09-06 (×3): 40 mg via ORAL
  Filled 2022-09-04 (×3): qty 1

## 2022-09-04 MED ORDER — CHLORHEXIDINE GLUCONATE 4 % EX LIQD: 30.0000 mL | CUTANEOUS | Status: DC

## 2022-09-04 MED ORDER — ACETAMINOPHEN 325 MG PO TABS
650.0000 mg | ORAL_TABLET | Freq: Four times a day (QID) | ORAL | Status: DC | PRN
  Administered 2022-09-05: 650 mg via ORAL
  Filled 2022-09-04 (×2): qty 2

## 2022-09-04 MED ORDER — CEFAZOLIN SODIUM-DEXTROSE 2-4 GM/100ML-% IV SOLN
2.0000 g | Freq: Three times a day (TID) | INTRAVENOUS | Status: AC
  Administered 2022-09-04 – 2022-09-05 (×2): 2 g via INTRAVENOUS
  Filled 2022-09-04 (×2): qty 100

## 2022-09-04 MED ORDER — ONDANSETRON HCL 4 MG/2ML IJ SOLN
INTRAMUSCULAR | Status: DC | PRN
  Administered 2022-09-04: 4 mg via INTRAVENOUS

## 2022-09-04 MED ORDER — SODIUM CHLORIDE 0.9% FLUSH: 3.0000 mL | INTRAVENOUS | Status: DC | PRN

## 2022-09-04 MED ORDER — ONDANSETRON HCL 4 MG/2ML IJ SOLN
INTRAMUSCULAR | Status: AC
  Filled 2022-09-04: qty 2

## 2022-09-04 MED ORDER — PROTAMINE SULFATE 10 MG/ML IV SOLN
INTRAVENOUS | Status: DC | PRN
  Administered 2022-09-04: 10 mg via INTRAVENOUS
  Administered 2022-09-04: 20 mg via INTRAVENOUS
  Administered 2022-09-04: 140 mg via INTRAVENOUS
  Administered 2022-09-04: 20 mg via INTRAVENOUS

## 2022-09-04 MED ORDER — PROTAMINE SULFATE 10 MG/ML IV SOLN
INTRAVENOUS | Status: AC
  Filled 2022-09-04: qty 25

## 2022-09-04 MED ORDER — SUGAMMADEX SODIUM 200 MG/2ML IV SOLN
INTRAVENOUS | Status: DC | PRN
  Administered 2022-09-04: 200 mg via INTRAVENOUS

## 2022-09-04 MED ORDER — PROPOFOL 10 MG/ML IV BOLUS
INTRAVENOUS | Status: DC | PRN
  Administered 2022-09-04 (×2): 10 mg via INTRAVENOUS
  Administered 2022-09-04: 80 mg via INTRAVENOUS
  Administered 2022-09-04: 15 mg via INTRAVENOUS

## 2022-09-04 MED ORDER — SODIUM CHLORIDE 0.9 % IV SOLN: 250.0000 mL | INTRAVENOUS | Status: DC

## 2022-09-04 MED ORDER — IODIXANOL 320 MG/ML IV SOLN
INTRAVENOUS | Status: DC | PRN
  Administered 2022-09-04: 65 mL via INTRA_ARTERIAL

## 2022-09-04 MED ORDER — PHENYLEPHRINE 80 MCG/ML (10ML) SYRINGE FOR IV PUSH (FOR BLOOD PRESSURE SUPPORT)
PREFILLED_SYRINGE | INTRAVENOUS | Status: AC
  Filled 2022-09-04: qty 10

## 2022-09-04 MED ORDER — HEPARIN SODIUM (PORCINE) 1000 UNIT/ML IJ SOLN
INTRAMUSCULAR | Status: AC
  Filled 2022-09-04: qty 1

## 2022-09-04 MED ORDER — OXYCODONE HCL 5 MG PO TABS: 5.0000 mg | ORAL_TABLET | ORAL | Status: DC | PRN

## 2022-09-04 MED ORDER — HEPARIN 6000 UNIT IRRIGATION SOLUTION
Status: AC
  Filled 2022-09-04: qty 500

## 2022-09-04 MED ORDER — HEPARIN 6000 UNIT IRRIGATION SOLUTION
Status: DC | PRN
  Administered 2022-09-04 (×3): 1

## 2022-09-04 MED ORDER — SUCCINYLCHOLINE CHLORIDE 200 MG/10ML IV SOSY
PREFILLED_SYRINGE | INTRAVENOUS | Status: DC | PRN
  Administered 2022-09-04: 100 mg via INTRAVENOUS

## 2022-09-04 MED ORDER — ALBUMIN HUMAN 5 % IV SOLN: INTRAVENOUS | Status: DC | PRN

## 2022-09-04 MED ORDER — ROCURONIUM BROMIDE 10 MG/ML (PF) SYRINGE
PREFILLED_SYRINGE | INTRAVENOUS | Status: DC | PRN
  Administered 2022-09-04: 20 mg via INTRAVENOUS
  Administered 2022-09-04: 40 mg via INTRAVENOUS

## 2022-09-04 MED ORDER — SODIUM CHLORIDE 0.9 % IV SOLN: 250.0000 mL | INTRAVENOUS | Status: DC | PRN

## 2022-09-04 MED ORDER — TRAMADOL HCL 50 MG PO TABS
50.0000 mg | ORAL_TABLET | ORAL | Status: DC | PRN
  Administered 2022-09-04: 100 mg via ORAL
  Filled 2022-09-04: qty 2

## 2022-09-04 MED ORDER — NITROGLYCERIN IN D5W 200-5 MCG/ML-% IV SOLN
0.0000 ug/min | INTRAVENOUS | Status: DC
  Filled 2022-09-04: qty 250

## 2022-09-04 MED ORDER — ONDANSETRON HCL 4 MG/2ML IJ SOLN: 4.0000 mg | Freq: Four times a day (QID) | INTRAMUSCULAR | Status: DC | PRN

## 2022-09-04 MED ORDER — SODIUM CHLORIDE 0.9 % IV SOLN: INTRAVENOUS | Status: DC

## 2022-09-04 MED ORDER — SODIUM CHLORIDE 0.9 % IV SOLN: INTRAVENOUS | Status: AC

## 2022-09-04 SURGICAL SUPPLY — 78 items
BAG COUNTER SPONGE SURGICOUNT (BAG) ×4 IMPLANT
BAG DECANTER FOR FLEXI CONT (MISCELLANEOUS) IMPLANT
BLADE CLIPPER SURG (BLADE) IMPLANT
BLADE STERNUM SYSTEM 6 (BLADE) IMPLANT
CABLE ADAPT CONN TEMP 6FT (ADAPTER) ×4 IMPLANT
CANISTER SUCT 3000ML PPV (MISCELLANEOUS) IMPLANT
CANISTER WOUND CARE 500ML ATS (WOUND CARE) IMPLANT
CANNULA VESSEL 3MM 2 BLNT TIP (CANNULA) IMPLANT
CATH DIAG EXPO 6F AL1 (CATHETERS) IMPLANT
CATH DIAG EXPO 6F VENT PIG 145 (CATHETERS) ×8 IMPLANT
CATH EMB 4FR 80CM (CATHETERS) IMPLANT
CATH S G BIP PACING (CATHETERS) ×4 IMPLANT
CHLORAPREP W/TINT 26 (MISCELLANEOUS) ×4 IMPLANT
CLIP TI MEDIUM 24 (CLIP) IMPLANT
CLIP TI WIDE RED SMALL 24 (CLIP) IMPLANT
CLOSURE MYNX CONTROL 6F/7F (Vascular Products) IMPLANT
CNTNR URN SCR LID CUP LEK RST (MISCELLANEOUS) ×8 IMPLANT
CONT SPEC 4OZ STRL OR WHT (MISCELLANEOUS) ×8
COVER BACK TABLE 80X110 HD (DRAPES) IMPLANT
DERMABOND ADVANCED .7 DNX12 (GAUZE/BANDAGES/DRESSINGS) ×4 IMPLANT
DEVICE CLOSURE PERCLS PRGLD 6F (VASCULAR PRODUCTS) ×8 IMPLANT
DRESSING PEEL AND PLC PRVNA 13 (GAUZE/BANDAGES/DRESSINGS) IMPLANT
DRSG PEEL AND PLACE PREVENA 13 (GAUZE/BANDAGES/DRESSINGS) ×4
DRSG TEGADERM 4X4.5 CHG (GAUZE/BANDAGES/DRESSINGS) IMPLANT
DRSG TEGADERM 4X4.75 (GAUZE/BANDAGES/DRESSINGS) ×8 IMPLANT
ELECT REM PT RETURN 9FT ADLT (ELECTROSURGICAL) ×4
ELECTRODE REM PT RTRN 9FT ADLT (ELECTROSURGICAL) ×4 IMPLANT
GAUZE SPONGE 4X4 12PLY STRL (GAUZE/BANDAGES/DRESSINGS) ×4 IMPLANT
GLOVE BIO SURGEON STRL SZ 6.5 (GLOVE) IMPLANT
GLOVE BIO SURGEON STRL SZ7.5 (GLOVE) ×4 IMPLANT
GLOVE BIO SURGEON STRL SZ8 (GLOVE) ×4 IMPLANT
GLOVE ECLIPSE 7.0 STRL STRAW (GLOVE) ×4 IMPLANT
GOWN STRL REUS W/ TWL LRG LVL3 (GOWN DISPOSABLE) IMPLANT
GOWN STRL REUS W/ TWL XL LVL3 (GOWN DISPOSABLE) ×4 IMPLANT
GOWN STRL REUS W/TWL LRG LVL3 (GOWN DISPOSABLE)
GOWN STRL REUS W/TWL XL LVL3 (GOWN DISPOSABLE) ×4
GUIDEWIRE SAF TJ AMPL .035X180 (WIRE) ×4 IMPLANT
GUIDEWIRE SAFE TJ AMPLATZ EXST (WIRE) ×4 IMPLANT
KIT BASIN OR (CUSTOM PROCEDURE TRAY) ×4 IMPLANT
KIT DRSG PREVENA PLUS 7DAY 125 (MISCELLANEOUS) IMPLANT
KIT HEART LEFT (KITS) ×4 IMPLANT
KIT SAPIAN 3 ULTRA RESILIA 26 (Valve) IMPLANT
KIT TURNOVER KIT B (KITS) ×4 IMPLANT
LOOP VESSEL MINI RED (MISCELLANEOUS) IMPLANT
NS IRRIG 1000ML POUR BTL (IV SOLUTION) ×4 IMPLANT
PACK ENDO MINOR (CUSTOM PROCEDURE TRAY) ×4 IMPLANT
PACK PERIPHERAL VASCULAR (CUSTOM PROCEDURE TRAY) IMPLANT
PAD ARMBOARD 7.5X6 YLW CONV (MISCELLANEOUS) ×8 IMPLANT
PAD ELECT DEFIB RADIOL ZOLL (MISCELLANEOUS) ×4 IMPLANT
PATCH VASC XENOSURE 1CMX6CM (Vascular Products) ×4 IMPLANT
PATCH VASC XENOSURE 1X6 (Vascular Products) IMPLANT
PERCLOSE PROGLIDE 6F (VASCULAR PRODUCTS) ×12
POSITIONER HEAD DONUT 9IN (MISCELLANEOUS) ×4 IMPLANT
SET MICROPUNCTURE 5F STIFF (MISCELLANEOUS) ×4 IMPLANT
SHEATH BRITE TIP 7FR 35CM (SHEATH) ×4 IMPLANT
SHEATH PINNACLE 6F 10CM (SHEATH) ×4 IMPLANT
SHEATH PINNACLE 8F 10CM (SHEATH) ×4 IMPLANT
SHIELD RADPAD ABSORB 11X34 (MISCELLANEOUS) IMPLANT
SLEEVE REPOSITIONING LENGTH 30 (MISCELLANEOUS) ×4 IMPLANT
SPIKE FLUID TRANSFER (MISCELLANEOUS) ×4 IMPLANT
SPONGE INTESTINAL PEANUT (DISPOSABLE) IMPLANT
SPONGE T-LAP 18X18 ~~LOC~~+RFID (SPONGE) ×4 IMPLANT
STOPCOCK 4 WAY LG BORE MALE ST (IV SETS) IMPLANT
STOPCOCK MORSE 400PSI 3WAY (MISCELLANEOUS) ×8 IMPLANT
SUT PROLENE 5 0 C 1 24 (SUTURE) IMPLANT
SUT PROLENE 6 0 BV (SUTURE) IMPLANT
SUT SILK  1 MH (SUTURE) ×4
SUT SILK 1 MH (SUTURE) ×4 IMPLANT
SUT VIC AB 2-0 CT1 27 (SUTURE) ×16
SUT VIC AB 2-0 CT1 TAPERPNT 27 (SUTURE) IMPLANT
SYR 3ML LL SCALE MARK (SYRINGE) IMPLANT
SYR 50ML LL SCALE MARK (SYRINGE) ×4 IMPLANT
TOWEL GREEN STERILE (TOWEL DISPOSABLE) ×8 IMPLANT
TRANSDUCER W/STOPCOCK (MISCELLANEOUS) ×8 IMPLANT
WIRE EMERALD 3MM-J .035X150CM (WIRE) ×4 IMPLANT
WIRE EMERALD 3MM-J .035X260CM (WIRE) ×4 IMPLANT
WIRE EMERALD ST .035X260CM (WIRE) ×8 IMPLANT
WIRE SAFARI SM CURVE 275 (WIRE) ×4 IMPLANT

## 2022-09-04 NOTE — Anesthesia Preprocedure Evaluation (Addendum)
Anesthesia Evaluation  Patient identified by MRN, date of birth, ID band Patient awake    Reviewed: Allergy & Precautions, NPO status , Patient's Chart, lab work & pertinent test results  Airway Mallampati: III  TM Distance: >3 FB Neck ROM: Full    Dental  (+) Teeth Intact, Dental Advisory Given   Pulmonary neg pulmonary ROS,    breath sounds clear to auscultation       Cardiovascular hypertension, Pt. on medications + Valvular Problems/Murmurs AS  Rhythm:Regular Rate:Normal  Echo: 1. Mild intracavitary gradient. Peak velocity 1.37 m/s. Peak gradient 8  mmHg. With Valsalva, peak velocity increased to 2.6 m/s, peak gradient 28  mmHg. Left ventricular ejection fraction, by estimation, is 60 to 65%. The  left ventricle has normal  function. The left ventricle has no regional wall motion abnormalities.  There is mild left ventricular hypertrophy of the septal segment. Left  ventricular diastolic parameters are consistent with Grade I diastolic  dysfunction (impaired relaxation). The  average left ventricular global longitudinal strain is -24.1 %. The global  longitudinal strain is normal.  2. Right ventricular systolic function is normal. The right ventricular  size is normal. There is normal pulmonary artery systolic pressure.  3. The mitral valve is normal in structure. Trivial mitral valve  regurgitation. No evidence of mitral stenosis.  4. The aortic valve is calcified. There is moderate calcification of the  aortic valve. There is moderate thickening of the aortic valve. Aortic  valve regurgitation is mild. Moderate aortic valve stenosis. Aortic valve  area, by VTI measures 1.28 cm.  Aortic valve mean gradient measures 29.0 mmHg. Aortic valve Vmax measures  3.44 m/s.  5. The inferior vena cava is normal in size with greater than 50%  respiratory variability, suggesting right atrial pressure of 3 mmHg.     Neuro/Psych negative neurological ROS  negative psych ROS   GI/Hepatic Neg liver ROS, GERD  Medicated,  Endo/Other  negative endocrine ROS  Renal/GU negative Renal ROS     Musculoskeletal negative musculoskeletal ROS (+)   Abdominal Normal abdominal exam  (+)   Peds  Hematology negative hematology ROS (+)   Anesthesia Other Findings   Reproductive/Obstetrics                            Anesthesia Physical Anesthesia Plan  ASA: 4  Anesthesia Plan: MAC   Post-op Pain Management:    Induction: Intravenous  PONV Risk Score and Plan: 0 and Propofol infusion  Airway Management Planned: Natural Airway and Simple Face Mask  Additional Equipment:   Intra-op Plan:   Post-operative Plan:   Informed Consent: I have reviewed the patients History and Physical, chart, labs and discussed the procedure including the risks, benefits and alternatives for the proposed anesthesia with the patient or authorized representative who has indicated his/her understanding and acceptance.       Plan Discussed with: CRNA  Anesthesia Plan Comments:        Anesthesia Quick Evaluation

## 2022-09-04 NOTE — Progress Notes (Signed)
  Echocardiogram 2D Echocardiogram has been performed.  Darlina Sicilian M 09/04/2022, 1:01 PM

## 2022-09-04 NOTE — Transfer of Care (Signed)
Immediate Anesthesia Transfer of Care Note  Patient: Traci [name]  Procedure(s) Performed: Transcatheter Aortic Valve Replacement, Transfemoral (Chest) INTRAOPERATIVE TRANSTHORACIC ECHOCARDIOGRAM ULTRASOUND GUIDANCE FOR VASCULAR ACCESS (Bilateral: Groin) GROIN EXPLORATION (Right: Groin) BOVINE PATCH ANGIOPLASTY (Right: Groin) APPLICATION OF WOUND VAC (Right: Groin) ENDARTERECTOMY FEMORAL (Right: Groin)  Patient Location: Cath Lab  Anesthesia Type:General  Level of Consciousness: drowsy  Airway & Oxygen Therapy: Patient Spontanous Breathing and Patient connected to nasal cannula oxygen  Post-op Assessment: Report given to RN and Post -op Vital signs reviewed and stable  Post vital signs: Reviewed and stable  Last Vitals:  Vitals Value Taken Time  BP 115/37 09/04/22 1545  Temp 36.6 C 09/04/22 1541  Pulse 63 09/04/22 1556  Resp 21 09/04/22 1556  SpO2 97 % 09/04/22 1556  Vitals shown include unvalidated device data.  Last Pain:  Vitals:   09/04/22 1541  TempSrc: Temporal  PainSc: 0-No pain         Complications: No notable events documented.

## 2022-09-04 NOTE — Anesthesia Procedure Notes (Signed)
Arterial Line Insertion Start/End10/24/2023 10:00 AM Performed by: Griffin Dakin, CRNA, CRNA  Patient location: Pre-op. Preanesthetic checklist: patient identified, IV checked, site marked, risks and benefits discussed, surgical consent, monitors and equipment checked, pre-op evaluation, timeout performed and anesthesia consent Lidocaine 1% used for infiltration Right, radial was placed Catheter size: 20 G Hand hygiene performed  and maximum sterile barriers used   Attempts: 1 Procedure performed without using ultrasound guided technique. Following insertion, dressing applied and Biopatch. Post procedure assessment: normal and unchanged  Patient tolerated the procedure well with no immediate complications.

## 2022-09-04 NOTE — Anesthesia Postprocedure Evaluation (Signed)
Anesthesia Post Note  Patient: ITXEL WICKARD  Procedure(s) Performed: Transcatheter Aortic Valve Replacement, Transfemoral (Chest) INTRAOPERATIVE TRANSTHORACIC ECHOCARDIOGRAM ULTRASOUND GUIDANCE FOR VASCULAR ACCESS (Bilateral: Groin) GROIN EXPLORATION (Right: Groin) BOVINE PATCH ANGIOPLASTY (Right: Groin) APPLICATION OF WOUND VAC (Right: Groin) ENDARTERECTOMY FEMORAL (Right: Groin)     Patient location during evaluation: PACU Anesthesia Type: General Level of consciousness: awake and alert Pain management: pain level controlled Vital Signs Assessment: post-procedure vital signs reviewed and stable Respiratory status: spontaneous breathing, nonlabored ventilation, respiratory function stable and patient connected to nasal cannula oxygen Cardiovascular status: blood pressure returned to baseline and stable Postop Assessment: no apparent nausea or vomiting Anesthetic complications: no   No notable events documented.  Last Vitals:  Vitals:   09/04/22 1541 09/04/22 1634  BP: (!) 102/31 (!) 116/43  Pulse: 74 65  Resp: 19 18  Temp: 36.6 C 36.8 C  SpO2:      Last Pain:  Vitals:   09/04/22 1634  TempSrc: Temporal  PainSc: 0-No pain                 Effie Berkshire

## 2022-09-04 NOTE — Discharge Instructions (Signed)

## 2022-09-04 NOTE — Anesthesia Procedure Notes (Signed)
Procedure Name: MAC Date/Time: 09/04/2022 11:34 AM  Performed by: Carolan Clines, CRNAPre-anesthesia Checklist: Patient identified, Emergency Drugs available, Suction available and Patient being monitored Patient Re-evaluated:Patient Re-evaluated prior to induction Oxygen Delivery Method: Simple face mask Dental Injury: Teeth and Oropharynx as per pre-operative assessment

## 2022-09-04 NOTE — Op Note (Signed)
[facility]   MULTIDISCIPLINARY HEART VALVE TEAM   TAVR OPERATIVE NOTE   Date of Procedure:  09/04/2022  Preoperative Diagnosis: Severe Aortic Stenosis   Postoperative Diagnosis: Same   Procedure:   Transcatheter Aortic Valve Replacement - Percutaneous Right Transfemoral Approach  Edwards Sapien 3 Ultra THV (size 26 mm, model # 9755RSL, serial # [id number])        Repair of Right Femoral Dissection. (Performed and dictated by Dr Doren Custard)   Co-Surgeons:  [name] MD and Sherren Mocha, MD     Anesthesiologist:  Suella Broad MD  Echocardiographer:  Darlina Sicilian Echo tec  Pre-operative Echo Findings: Severe aortic stenosis Preserved  left ventricular systolic function  Post-operative Echo Findings: No paravalvular leak Preserved  left ventricular systolic function   BRIEF CLINICAL NOTE AND INDICATIONS FOR SURGERY   Pt with NYHA class III symptoms from moderate to severe AS by measurements with normal LV function. She has no other etioligies to explain her symptoms which are classic for her AS. I believe secondary to her age that TAVR would be best option moving forward and I have discussed the procedure with her and her son present and they understand and wish to proceed     DETAILS OF THE OPERATIVE PROCEDURE  PREPARATION:    The patient was brought to the operating room on the above mentioned date and appropriate monitoring was established by the anesthesia team. The patient was placed in the supine position on the operating table.  Intravenous antibiotics were administered. The patient was monitored closely throughout the procedure under conscious sedation.    Baseline transthoracic echocardiogram was performed. The patient's abdomen and both groins were prepped and draped in a sterile manner. A time out procedure was performed.   PERIPHERAL ACCESS:    Using the modified Seldinger technique, femoral arterial and venous access was obtained with  placement of 6 Fr sheaths on the Left side.  A pigtail diagnostic catheter was passed through the left arterial sheath under fluoroscopic guidance into the aortic root.  A temporary transvenous pacemaker catheter was passed through the left femoral venous sheath under fluoroscopic guidance into the right ventricle.  The pacemaker was tested to ensure stable lead placement and pacemaker capture. Aortic root angiography was performed in order to determine the optimal angiographic angle for valve deployment.   TRANSFEMORAL ACCESS:   Percutaneous transfemoral access and sheath placement was performed using ultrasound guidance.  The right common femoral artery was cannulated using a micropuncture needle and appropriate location was verified using hand injection angiogram.  A pair of Abbott Perclose percutaneous closure devices were placed and a 6 French sheath replaced into the femoral artery.  The patient was heparinized systemically and ACT verified > 250 seconds.    A 14 Fr transfemoral E-sheath was introduced into the right common femoral artery after progressively dilating over an Amplatz superstiff wire. An AL2 catheter was used to direct a straight-tip exchange length wire across the native aortic valve into the left ventricle. This was exchanged out for a pigtail catheter and position was confirmed in the LV apex. Simultaneous LV and Ao pressures were recorded.  The pigtail catheter was exchanged for a Safari wire in the LV apex.   BALLOON AORTIC VALVULOPLASTY:   Was not done   TRANSCATHETER HEART VALVE DEPLOYMENT:   An Edwards Sapien 3 Ultra transcatheter heart valve (size 26 mm) was prepared and crimped per manufacturer's guidelines, and the proper orientation of the valve is confirmed on the  [employer] delivery system. The valve was advanced through the introducer sheath using normal technique until in an appropriate position in the abdominal aorta beyond the sheath tip. The balloon was  then retracted and using the fine-tuning wheel was centered on the valve. The valve was then advanced across the aortic arch using appropriate flexion of the catheter. The valve was carefully positioned across the aortic valve annulus. The Commander catheter was retracted using normal technique. Once final position of the valve has been confirmed by angiographic assessment, the valve is deployed during rapid ventricular pacing to maintain systolic blood pressure < 50 mmHg and pulse pressure < 10 mmHg. The balloon inflation is held for >3 seconds after reaching full deployment volume. Once the balloon has fully deflated the balloon is retracted into the ascending aorta and valve function is assessed using echocardiography. There is felt to be no paravalvular leak and no central aortic insufficiency.  The patient's hemodynamic recovery following valve deployment is good.  The deployment balloon and guidewire are both removed.    PROCEDURE COMPLETION:   The sheath was removed and femoral artery closure performed.  Protamine was administered once femoral arterial repair was complete. The temporary pacemaker, pigtail catheter and femoral sheaths were removed with manual pressure used for venous hemostasis. At this point it was noted no significant pulse was palpable below the right femoral closure.  The diagnostic left femoral pigtail was then positioned at the iliac bifurcation and an angiogram performed and noted an occlusion of the right femoral closure. Vascular surgery was notified and an open repair was performed by Dr Doren Custard with a patch.(This will be dictated by him) All sponge instrument and needle counts are verified correct at completion of the operation. The patient tolerated the procedure well and is transported to the cath lab recovery area in stable condition.    No blood products were administered during the operation.   [name], MD 09/04/2022 2:50 PM

## 2022-09-04 NOTE — Anesthesia Procedure Notes (Signed)
Procedure Name: Intubation Date/Time: 09/04/2022 1:17 PM  Performed by: Carolan Clines, CRNAPre-anesthesia Checklist: Patient identified, Emergency Drugs available, Suction available and Patient being monitored Patient Re-evaluated:Patient Re-evaluated prior to induction Oxygen Delivery Method: Circle System Utilized Preoxygenation: Pre-oxygenation with 100% oxygen Induction Type: IV induction Ventilation: Mask ventilation without difficulty Laryngoscope Size: Mac and 3 Grade View: Grade II Tube type: Oral Tube size: 7.0 mm Number of attempts: 1 Airway Equipment and Method: Stylet Placement Confirmation: ETT inserted through vocal cords under direct vision, positive ETCO2 and breath sounds checked- equal and bilateral Secured at: 21 cm Tube secured with: Tape Dental Injury: Teeth and Oropharynx as per pre-operative assessment

## 2022-09-04 NOTE — Discharge Summary (Incomplete)
[facility] VALVE TEAM  Discharge Summary    Patient ID: Traci [name] MRN: [id number]; DOB: 03-Oct-1941  Admit date: 09/04/2022 Discharge date: 09/06/2022  Primary Care Provider: Burnard Bunting, MD  Primary Cardiologist: Sanda Klein, MD / Dr. Burt Knack & Dr. Lavonna Monarch (TAVR)  Discharge Diagnoses    Principal Problem:   S/P TAVR (transcatheter aortic valve replacement) Active Problems:   Essential hypertension   Hyperlipidemia   Mild obesity   Severe aortic stenosis   Dissection of femoral artery (HCC)   Allergies No Known Allergies  Diagnostic Studies/Procedures    TAVR OPERATIVE NOTE     Date of Procedure:                09/04/2022   Preoperative Diagnosis:      Severe Aortic Stenosis    Postoperative Diagnosis:    Same    Procedure:        Transcatheter Aortic Valve Replacement - Percutaneous Right Transfemoral Approach             Edwards Sapien 3 Ultra THV (size 26 mm, model # 9755RSL, serial # [id number])        Repair of Right Femoral Dissection. (Performed and dictated by Dr Doren Custard)              Co-Surgeons:                        [name] MD and Sherren Mocha, MD       Anesthesiologist:                  Suella Broad MD   Echocardiographer:              Darlina Sicilian Echo tec   Pre-operative Echo Findings: Severe aortic stenosis Preserved  left ventricular systolic function   Post-operative Echo Findings: No paravalvular leak Preserved  left ventricular systolic function _____________    Echo 09/05/22: completed but pending formal read at the time of discharge   History of Present Illness     Traci [name] is a 81 y.o. female with a history of HTN, HLD and severe AS who presented to [facility] on 09/04/22 for planned TAVR.   Ms. Arps is followed by Dr. Sallyanne Kuster for her cardiology care who has been following her mild to moderate aortic stenosis for quite some time. Recent echocardiogram showed  progression of her aortic stenosis, gradient is moderate with a mean gradient of 29 mm and calculated aortic valve area of 1.2 cm. However because of progressive symptoms, she was referred for cardiac catheterization which demonstrated a higher transaortic gradient of 37 mmHg mean and no obstructive CAD. She began having progressive symptoms of fatigue and exertional dyspnea with most activities over the last 3-4 months without other explanation.   The patient was evaluated by the multidisciplinary valve team and felt to have severe, symptomatic aortic stenosis and to be a suitable candidate for TAVR, which was set up for 09/04/22 .  Hospital Course    Severe AS: s/p successful TAVR with a 26 mm Edwards Sapien 3 THV via the TF approach on 09/04/22. Post operative echo pending. Groin sites are stable. ECG with NSR and no high grade heart block. Continue home aspirin. Plan for discharge home today with HHPT. We will see her next week for close follow up  Right femoral artery dissection: TAVR was c/b a right femoral artery dissection and underwent successful CFA  endarterectomy by Dr. Doren Custard. Continue wound vac x 1 week. Follow up with [facility]. Will call in some pain meds.  Acute blood loss anemia: Hg down to 8.9. This is likely surgery related.   HTN: BP well controlled. No changes made.    HLD: continue statin.   Hydroureteronephrosis: pre TAVR CT showed mild bilateral hydroureteronephrosis, left greater than right, probably due to the small to moderate hydrocele (? In a woman). Suggest correlation with serum creatinine. Creat has been normal. Will send to urology as an outpatient.   Consultants: [facility]    _____________  Discharge Vitals Blood pressure (!) 132/50, pulse 96, temperature 98.6 F (37 C), temperature source Oral, resp. rate 20, height 5' 7"  (1.702 m), weight 94.8 kg, SpO2 94 %.  Filed Weights   09/04/22 0807  Weight: 94.8 kg    GEN: Well nourished, well developed, in no acute  distress HEENT: normal Neck: no JVD or masses Cardiac: RRR; no murmurs, rubs, or gallops,no edema  Respiratory:  clear to auscultation bilaterally, normal work of breathing GI: soft, nontender, nondistended, + BS MS: no deformity or atrophy Skin: warm and dry, no rash.  Groin sites clear without hematoma or ecchymosis. Wound vac in place.  Neuro:  Alert and Oriented x 3, Strength and sensation are intact Psych: euthymic mood, full affect   Labs & Radiologic Studies    CBC Recent Labs    09/04/22 1601 09/05/22 0419  WBC  --  8.1  HGB 9.2* 8.9*  HCT 27.0* 27.7*  MCV  --  92.3  PLT  --  790   Basic Metabolic Panel Recent Labs    09/04/22 1601 09/05/22 0419  NA 146* 141  K 3.6 3.7  CL 107 109  CO2  --  24  GLUCOSE 128* 122*  BUN 14 14  CREATININE 0.80 0.94  CALCIUM  --  8.3*  MG  --  1.7   Liver Function Tests No results for input(s): "AST", "ALT", "ALKPHOS", "BILITOT", "PROT", "ALBUMIN" in the last 72 hours. No results for input(s): "LIPASE", "AMYLASE" in the last 72 hours. Cardiac Enzymes No results for input(s): "CKTOTAL", "CKMB", "CKMBINDEX", "TROPONINI" in the last 72 hours. BNP Invalid input(s): "POCBNP" D-Dimer No results for input(s): "DDIMER" in the last 72 hours. Hemoglobin A1C No results for input(s): "HGBA1C" in the last 72 hours. Fasting Lipid Panel No results for input(s): "CHOL", "HDL", "LDLCALC", "TRIG", "CHOLHDL", "LDLDIRECT" in the last 72 hours. Thyroid Function Tests No results for input(s): "TSH", "T4TOTAL", "T3FREE", "THYROIDAB" in the last 72 hours.  Invalid input(s): "FREET3" _____________  ECHOCARDIOGRAM LIMITED  Result Date: 09/04/2022    ECHOCARDIOGRAM LIMITED REPORT   Patient Name:   Traci [name] Date of Exam: 09/04/2022 Medical Rec #:  [id number]      Height:       67.0 in Accession #:    [id number]     Weight:       209.0 lb Date of Birth:  1941-02-18     BSA:          2.060 m Patient Age:    36 years       BP:           169/82  mmHg Patient Gender: F              HR:           63 bpm. Exam Location:  Inpatient Procedure: Limited Echo, Cardiac Doppler and Color Doppler Indications:     Aortic valve  stenosis, nonrheumatic [I35.0 (ICD-10-CM)]  History:         Patient has prior history of Echocardiogram examinations, most                  recent 04/10/2022. Risk Factors:Hypertension and Dyslipidemia.                  Aortic Valve: 26 mm Sapien prosthetic, stented (TAVR) valve is                  present in the aortic position. Procedure Date: 09/07/2022.  Sonographer:     Darlina Sicilian RDCS Referring Phys:  [id number] Woodfin Ganja Karnisha Lefebre Diagnosing Phys: Eleonore Chiquito MD IMPRESSIONS  1. Echo guided TAVR. 26 mm S3. Vmax 1.3 m/s, MG 3 mmHG, EOA 3.3 cm2, DI 0.80. No regurgitation or paravalvular leak. Normal prosthesis. There is a 26 mm Sapien prosthetic (TAVR) valve present in the aortic position. Procedure Date: 09/07/2022.  2. Left ventricular ejection fraction, by estimation, is 60 to 65%. The left ventricle has normal function.  3. Right ventricular systolic function is normal. The right ventricular size is normal.  4. The mitral valve is degenerative. Mild mitral valve regurgitation. No evidence of mitral stenosis. FINDINGS  Left Ventricle: Left ventricular ejection fraction, by estimation, is 60 to 65%. The left ventricle has normal function. Right Ventricle: The right ventricular size is normal. No increase in right ventricular wall thickness. Right ventricular systolic function is normal. Pericardium: Trivial pericardial effusion is present. Mitral Valve: The mitral valve is degenerative in appearance. Mild mitral valve regurgitation. No evidence of mitral valve stenosis. Aortic Valve: Echo guided TAVR. 26 mm S3. Vmax 1.3 m/s, MG 3 mmHG, EOA 3.3 cm2, DI 0.80. No regurgitation or paravalvular leak. Normal prosthesis. Aortic valve mean gradient measures 3.0 mmHg. Aortic valve peak gradient measures 7.6 mmHg. Aortic valve area, by VTI measures  3.30 cm. There is a 26 mm Sapien prosthetic, stented (TAVR) valve present in the aortic position. Procedure Date: 09/07/2022. LEFT VENTRICLE PLAX 2D LVOT diam:     2.30 cm LV SV:         95 LV SV Index:   46 LVOT Area:     4.15 cm  AORTIC VALVE AV Area (Vmax):    3.10 cm AV Area (Vmean):   1.84 cm AV Area (VTI):     3.30 cm AV Vmax:           138.00 cm/s AV Vmean:          170.600 cm/s AV VTI:            0.288 m AV Peak Grad:      7.6 mmHg AV Mean Grad:      3.0 mmHg LVOT Vmax:         103.00 cm/s LVOT Vmean:        75.600 cm/s LVOT VTI:          0.229 m LVOT/AV VTI ratio: 0.80  SHUNTS Systemic VTI:  0.23 m Systemic Diam: 2.30 cm Eleonore Chiquito MD Electronically signed by Eleonore Chiquito MD Signature Date/Time: 09/04/2022/4:06:25 PM    Final    Structural Heart Procedure  Result Date: 09/04/2022 See surgical note for result.  DG Chest 2 View  Result Date: 09/02/2022 CLINICAL DATA:  Preoperative chest radiograph prior to cardiac surgery. EXAM: CHEST - 2 VIEW COMPARISON:  02/14/2011 radiograph and 07/27/2022 CT FINDINGS: The cardiomediastinal silhouette is unremarkable. There is no evidence of focal airspace disease, pulmonary edema, suspicious  pulmonary nodule/mass, pleural effusion, or pneumothorax. No acute bony abnormalities are identified. IMPRESSION: No active cardiopulmonary disease. Electronically Signed   By: Margarette Canada M.D.   On: 09/02/2022 14:40   Disposition   Pt is being discharged home today in good condition.  Follow-up Plans & Appointments     Follow-up Information     Tommie Raymond, NP. Go on 09/12/2022.   Specialty: Cardiology Why: @ 1:30pm. Please arrive at least 10 minutes early. Contact information: [address] [place] [address] [phone]                Discharge Instructions     Amb Referral to Cardiac Rehabilitation   Complete by: As directed    Diagnosis: Valve Replacement   Valve: Aortic Comment - TAVR   After initial  evaluation and assessments completed: Virtual Based Care may be provided alone or in conjunction with Phase 2 Cardiac Rehab based on patient barriers.: Yes   Intensive Cardiac Rehabilitation (ICR) [place] location only OR Traditional Cardiac Rehabilitation (TCR) *If criteria for ICR are not met will enroll in TCR [facility] only): Yes   Face-to-face encounter (required for Medicare/Medicaid patients)   Complete by: As directed    I Angelena Form certify that this patient is under my care and that I, or a nurse practitioner or physician's assistant working with me, had a face-to-face encounter that meets the physician face-to-face encounter requirements with this patient on 09/06/2022. The encounter with the patient was in whole, or in part for the following medical condition(s) which is the primary reason for home health care (List medical condition): weakness, vascular complication, balance   The encounter with the patient was in whole, or in part, for the following medical condition, which is the primary reason for home health care: weakness, vascular complication, balance   I certify that, based on my findings, the following services are medically necessary home health services: Physical therapy   Reason for Medically Necessary Home Health Services: Skilled Nursing- Complex Wound Care   My clinical findings support the need for the above services: Can transfer bed to chair only   Further, I certify that my clinical findings support that this patient is homebound due to: Unsafe ambulation due to balance issues   Home Health   Complete by: As directed    To provide the following care/treatments: PT       Discharge Medications   Allergies as of 09/06/2022   No Known Allergies      Medication List     TAKE these medications    aspirin EC 81 MG tablet Take 81 mg by mouth daily. Swallow whole.   atorvastatin 20 MG tablet Commonly known as: LIPITOR Take 20 mg by mouth at bedtime.    cholecalciferol 1000 units tablet Commonly known as: VITAMIN D Take 1,000 Units by mouth daily.   lisinopril 20 MG tablet Commonly known as: ZESTRIL TAKE 1 TABLET BY MOUTH EVERY DAY   pantoprazole 40 MG tablet Commonly known as: PROTONIX Take 40 mg by mouth daily.   Soothe XP Soln Place 1 drop into both eyes daily.   traMADol 50 MG tablet Commonly known as: Ultram Take 1 tablet (50 mg total) by mouth every 6 (six) hours as needed.           Outstanding Labs/Studies   none  Duration of Discharge Encounter   Greater than 30 minutes including physician time.  Mable Fill, PA-C 09/06/2022, 9:14 AM [phone]

## 2022-09-04 NOTE — Op Note (Signed)
[facility]   MULTIDISCIPLINARY HEART VALVE TEAM   TAVR OPERATIVE NOTE   Date of Procedure:  09/04/2022  Preoperative Diagnosis: Severe Aortic Stenosis   Postoperative Diagnosis: Same   Procedure:   Transcatheter Aortic Valve Replacement - Percutaneous  Transfemoral Approach  Edwards Sapien 3 Ultra Resilia THV (size 26 mm, serial # [id number])   Co-Surgeons:  [name], MD and Sherren Mocha, MD  Anesthesiologist:  Suella Broad, MD  Echocardiographer:  Marry Guan, MD  Pre-operative Echo Findings: Severe aortic stenosis Normal left ventricular systolic function  Post-operative Echo Findings: No paravalvular leak Normal/unchanged left ventricular systolic function  BRIEF CLINICAL NOTE AND INDICATIONS FOR SURGERY  81 year old woman who has developed progressive, severe low-flow low gradient (stage D3) aortic stenosis with NYHA functional class III symptoms of progressive fatigue and shortness of breath.  She has undergone extensive multidisciplinary heart team evaluation and preoperative studies including cardiac catheterization that showed no obstructive CAD.  CT angiography studies have demonstrated suitable vascular access for TAVR with use of a 26 mm Edwards SAPIEN 3 valve.  During the course of the patient's preoperative work up they have been evaluated comprehensively by a multidisciplinary team of specialists coordinated through the [facility] in the [facility].  They have been demonstrated to suffer from symptomatic severe aortic stenosis as noted above. The patient has been counseled extensively as to the relative risks and benefits of all options for the treatment of severe aortic stenosis including long term medical therapy, conventional surgery for aortic valve replacement, and transcatheter aortic valve replacement.  The patient has been independently evaluated in formal cardiac surgical consultation  by Dr Cyndia Bent, who deemed the patient appropriate for TAVR. Based upon review of all of the patient's preoperative diagnostic tests they are felt to be candidate for transcatheter aortic valve replacement using the transfemoral approach as an alternative to conventional surgery.    Following the decision to proceed with transcatheter aortic valve replacement, a discussion has been held regarding what types of management strategies would be attempted intraoperatively in the event of life-threatening complications, including whether or not the patient would be considered a candidate for the use of cardiopulmonary bypass and/or conversion to open sternotomy for attempted surgical intervention.  The patient has been advised of a variety of complications that might develop peculiar to this approach including but not limited to risks of death, stroke, paravalvular leak, aortic dissection or other major vascular complications, aortic annulus rupture, device embolization, cardiac rupture or perforation, acute myocardial infarction, arrhythmia, heart block or bradycardia requiring permanent pacemaker placement, congestive heart failure, respiratory failure, renal failure, pneumonia, infection, other late complications related to structural valve deterioration or migration, or other complications that might ultimately cause a temporary or permanent loss of functional independence or other long term morbidity.  The patient provides full informed consent for the procedure as described and all questions were answered preoperatively.  DETAILS OF THE OPERATIVE PROCEDURE  PREPARATION:   The patient is brought to the operating room on the above mentioned date and central monitoring was established by the anesthesia team including placement of a radial arterial line. The patient is placed in the supine position on the operating table.  Intravenous antibiotics are administered. The patient is monitored closely throughout the  procedure under conscious sedation.  Baseline transthoracic echocardiogram is performed. The patient's chest, abdomen, both groins, and both lower extremities are prepared and draped in a sterile manner. A time out procedure is  performed.   PERIPHERAL ACCESS:   Using ultrasound guidance, femoral arterial and venous access is obtained with placement of 6 Fr sheaths on the left side.  [place] images are digitally captured and stored in the patient's chart. A pigtail diagnostic catheter was passed through the femoral arterial sheath under fluoroscopic guidance into the aortic root.  A temporary transvenous pacemaker catheter was passed through the femoral venous sheath under fluoroscopic guidance into the right ventricle.  The pacemaker was tested to ensure stable lead placement and pacemaker capture. Aortic root angiography was performed in order to determine the optimal angiographic angle for valve deployment.  TRANSFEMORAL ACCESS:  A micropuncture technique is used to access the right femoral artery under fluoroscopic and ultrasound guidance.  2 Perclose devices are deployed at 10' and 2' positions to 'PreClose' the femoral artery.  The second Perclose device seem to interact with the first device and there was a great deal of resistance pulling it out of the artery.  It eventually freed up and was able to be removed over a wire.  An 8 French sheath is placed and then an Amplatz Superstiff wire is advanced through the sheath. This is changed out for a 14 French transfemoral E-Sheath after progressively dilating over the Superstiff wire.  There was bleeding around the 8 French sheath, but complete hemostasis when the 14 French sheath was inserted.  An AL-1 catheter was used to direct a straight-tip exchange length wire across the native aortic valve into the left ventricle. This was exchanged out for a pigtail catheter and position was confirmed in the LV apex. Simultaneous LV and Ao pressures were recorded.  The  pigtail catheter was exchanged for an Amplatz Extra-stiff wire in the LV apex.    BALLOON AORTIC VALVULOPLASTY:  Not performed  TRANSCATHETER HEART VALVE DEPLOYMENT:  An Edwards Sapien 3 transcatheter heart valve (size 26 mm) was prepared and crimped per manufacturer's guidelines, and the proper orientation of the valve is confirmed on the [employer] delivery system. The valve was advanced through the introducer sheath using normal technique until in an appropriate position in the abdominal aorta beyond the sheath tip. The balloon was then retracted and using the fine-tuning wheel was centered on the valve. The valve was then advanced across the aortic arch using appropriate flexion of the catheter. The valve was carefully positioned across the aortic valve annulus. The Commander catheter was retracted using normal technique. Once final position of the valve has been confirmed by angiographic assessment, the valve is deployed while temporarily holding ventilation and during rapid ventricular pacing to maintain systolic blood pressure < 50 mmHg and pulse pressure < 10 mmHg. The balloon inflation is held for >3 seconds after reaching full deployment volume. Once the balloon has fully deflated the balloon is retracted into the ascending aorta and valve function is assessed using echocardiography. The patient's hemodynamic recovery following valve deployment is good.  The deployment balloon and guidewire are both removed. Echo demostrated acceptable post-procedural gradients, stable mitral valve function, and no aortic insufficiency.    PROCEDURE COMPLETION:  The sheath was removed and femoral artery closure is performed using the 2 previously deployed Perclose devices.  Initially after the first Perclose and second Perclose sutures were tightened, there was still active bleeding from the femoral arteriotomy site.  A third Perclose device was deployed in this controlled bleeding.  Protamine is  administered once femoral arterial repair was complete. The site is clear with no evidence of bleeding or hematoma after the  sutures are tightened. The temporary pacemaker and pigtail catheters are removed.  The distal right femoral artery is palpated and there was no palpable pulse distal to the arteriotomy site.  This prompted [place] to advance a pigtail catheter through the left femoral sheath and perform a distal abdominal aortogram with attention to the right iliofemoral runoff.  A power injection is performed and this demonstrates sluggish flow down into the common femoral artery with no contrast passing beyond the external iliac/common femoral.  We had high suspicion that the right common femoral artery is occluded at the access site.  Vascular surgery was called and performs a femoral artery cutdown with surgical repair.  Please see Dr. Nicole Cella note for complete details.  The patient received a total of 60 mL of intravenous contrast during the procedure.  FINAL CONCLUSION: Successful TAVR with a 26 mm Edwards SAPIEN 3 valve, procedure complicated by right femoral artery injury with occlusion necessitating vascular surgery repair.  Sherren Mocha, MD 09/04/2022 3:41 PM

## 2022-09-04 NOTE — Progress Notes (Signed)
  [place] VALVE TEAM  Patient doing well s/p TAVR. She is hemodynamically stable but BP is soft. Groin sites stable. Wound vac in placed on right. Good pulses. ECG with sinus and no high grade block. Plan to DC arterial line and transfer to 4E.   Angelena Form PA-C  [facility]  Pager [phone]

## 2022-09-04 NOTE — Interval H&P Note (Signed)
History and Physical Interval Note:  09/04/2022 9:34 AM  Traci [name]  has presented today for surgery, with the diagnosis of Severe Aortic Stenosis.  The various methods of treatment have been discussed with the patient and family. After consideration of risks, benefits and other options for treatment, the patient has consented to  Procedure(s): Transcatheter Aortic Valve Replacement, Transfemoral (N/A) INTRAOPERATIVE TRANSTHORACIC ECHOCARDIOGRAM (N/A) ULTRASOUND GUIDANCE FOR VASCULAR ACCESS (Bilateral) as a surgical intervention.  The patient's history has been reviewed, patient examined, no change in status, stable for surgery.  I have reviewed the patient's chart and labs.  Questions were answered to the patient's satisfaction.     [name]

## 2022-09-04 NOTE — Op Note (Signed)
    NAME: Traci [name]    MRN: [id number] DOB: Jan 13, 1941    DATE OF OPERATION: 09/04/2022  PREOP DIAGNOSIS:    Right common femoral artery dissection  POSTOP DIAGNOSIS:    Same  PROCEDURE:    Right common femoral artery endarterectomy with bovine pericardial patch angioplasty  SURGEON: Judeth Cornfield. Scot Dock, MD  ASSIST: [name], MD, Edsel Petrin, PA  ANESTHESIA: General  EBL: 200 cc  INDICATIONS:    Traci [name] is a 81 y.o. female who underwent transcatheter aortic valve replacement today.  During closure of the common femoral artery on the right and acute occlusion was noted likely secondary to dissection.  Vascular surgery was consulted intraoperatively.  FINDINGS:   There was a dissection of the common femoral artery.  The artery was endarterectomized and a bovine pericardial patch angioplasty was placed.  There was a biphasic posterior tibial signal at the completion of the procedure.  TECHNIQUE:   The patients right groin had been prepped and draped in usual sterile fashion.  Longitudinal incision was made over the common femoral artery and the dissection carried down to to the area of concern where the Perclose was.  This was adjacent to the inguinal ligament.  Once I try to separate the artery from the inguinal ligament there was bleeding present.  This was controlled digitally.  I was then able to expose the common femoral artery distal to this.  I was then able to retract the inguinal ligament and expose the distal external iliac artery.  Patient was heparinized.  The arteries were clamped and then the area of concern was dissected free.  The the sutures were removed.  I then asked extended the arteriotomy in both directions and there was clearly a dissection.  I extended the dissection distally to expose the proximal superficial femoral artery, deep femoral artery, and 2 other posterior branches.  I then remove the clamp allowing exposure down to the  bifurcation.  The endarterectomy was performed down to the distal common femoral artery where the small shelf was tacked.  The wound was irrigated with copious amounts of heparinized saline.  A Fogarty catheter was pressed up the external iliac artery and no clot was retrieved.  I also passed down the superficial femoral artery no clot was retrieved.  A bovine pericardial patch was then sewn using continuous 5-0 Prolene suture.  Prior to completing the patch closure the artery was backbled and flushed appropriately and the anastomosis completed.  Flow was reestablished to the left leg.  There was a posterior tibial signal which was biphasic at the completion.  Hemostasis was obtained in the wound and the heparin was partially reversed with protamine.  The wound was then closed with 2 deep layers of 2-0 Vicryl.  The subcutaneous layer was closed with 3-0 Vicryl and the skin closed with 4-0 Monocryl.  A Prevena dressing was placed.  The patient tolerated the procedure well was transferred to recovery room in stable condition.  All needle and sponge counts were correct.  Given the complexity of the case a first assistant was necessary in order to expedient the procedure and safely perform the technical aspects of the operation.  [name], MD, FACS [facility]  DATE OF DICTATION:   09/04/2022

## 2022-09-05 ENCOUNTER — Ambulatory Visit: Payer: Medicare HMO

## 2022-09-05 ENCOUNTER — Inpatient Hospital Stay (HOSPITAL_COMMUNITY): Payer: Medicare HMO

## 2022-09-05 ENCOUNTER — Encounter (HOSPITAL_COMMUNITY): Payer: Self-pay | Admitting: Cardiovascular Disease

## 2022-09-05 DIAGNOSIS — I350 Nonrheumatic aortic (valve) stenosis: Principal | ICD-10-CM

## 2022-09-05 DIAGNOSIS — Z952 Presence of prosthetic heart valve: Secondary | ICD-10-CM

## 2022-09-05 LAB — BASIC METABOLIC PANEL
Anion gap: 8 (ref 5–15)
BUN: 14 mg/dL (ref 8–23)
CO2: 24 mmol/L (ref 22–32)
Calcium: 8.3 mg/dL — ABNORMAL LOW (ref 8.9–10.3)
Chloride: 109 mmol/L (ref 98–111)
Creatinine, Ser: 0.94 mg/dL (ref 0.44–1.00)
GFR, Estimated: >60 mL/min (ref >60)
Glucose, Bld: 122 mg/dL — ABNORMAL HIGH (ref 70–99)
Potassium: 3.7 mmol/L (ref 3.5–5.1)
Sodium: 141 mmol/L (ref 135–145)

## 2022-09-05 LAB — CBC
HCT: 27.7 % — ABNORMAL LOW (ref 36.0–46.0)
Hemoglobin: 8.9 g/dL — ABNORMAL LOW (ref 12.0–15.0)
MCH: 29.7 pg (ref 26.0–34.0)
MCHC: 32.1 g/dL (ref 30.0–36.0)
MCV: 92.3 fL (ref 80.0–100.0)
Platelets: 162 10*3/uL (ref 150–400)
RBC: 3 MIL/uL — ABNORMAL LOW (ref 3.87–5.11)
RDW: 13.5 % (ref 11.5–15.5)
WBC: 8.1 10*3/uL (ref 4.0–10.5)
nRBC: 0 % (ref 0.0–0.2)

## 2022-09-05 LAB — MAGNESIUM: Magnesium: 1.7 mg/dL (ref 1.7–2.4)

## 2022-09-05 NOTE — Plan of Care (Signed)
  Problem: Clinical Measurements: Goal: Will remain free from infection Outcome: Progressing Goal: Diagnostic test results will improve Outcome: Progressing   

## 2022-09-05 NOTE — Evaluation (Signed)
Physical Therapy Evaluation Patient Details Name: Traci [name] MRN: [id number] DOB: 29-Apr-1941 Today's Date: 09/05/2022  History of Present Illness  Pt is an 81 y.o. female admitted 09/04/22 for same day TAVR complicated by R femoral artery dissection with successful CFA endarterectomy. PMH includes HTN, obesity.   Clinical Impression  Pt presents with an overall decrease in functional mobility secondary to above. PTA, pt mod indep with intermittent use of SPC, lives alone in [place] home, drives. Today, pt moving fairly well, preference for RW to ambulate; pt having some difficulty standing from lower surface heights due to groin pain and weakness, though ultimately able to stand without physical assist this session. Increased time discussing strategies for safe return home, including assist needs, DME, fall risk reduction, activity recommendations. Pt reports she will have initial support from family/friends, but hopes to be back by herself soon. Pt would benefit from continued acute PT services to maximize functional mobility and independence prior to d/c with HHPT services.      Recommendations for follow up therapy are one component of a multi-disciplinary discharge planning process, led by the attending physician.  Recommendations may be updated based on patient status, additional functional criteria and insurance authorization.  Follow Up Recommendations Home health PT      Assistance Recommended at Discharge Intermittent Supervision/Assistance  Patient can return home with the following  A little help with walking and/or transfers;A little help with bathing/dressing/bathroom;Assistance with cooking/housework;Assist for transportation;Help with stairs or ramp for entrance    Equipment Recommendations BSC/3in1  Recommendations for Other Services       Functional Status Assessment Patient has had a recent decline in their functional status and demonstrates the ability to make  significant improvements in function in a reasonable and predictable amount of time.     Precautions / Restrictions Precautions Precautions: Fall;Other (comment) Precaution Comments: R groin wound vac Restrictions Weight Bearing Restrictions: No      Mobility  Bed Mobility Overal bed mobility: Modified Independent             General bed mobility comments: bed flat, increased time and effort for rolling and sidelying to sit, mod indep for increased time    Transfers Overall transfer level: Needs assistance Equipment used: None, Rolling walker (2 wheels) Transfers: Sit to/from Stand Sit to Stand: Min guard, Supervision           General transfer comment: pt performing multiple sit<>stands from various surface heights during session, intermittent cues for sequencing and hand placement, reliant on use of momentum to power up and hands pushing from bed then walking up thighs to achieve fully upright; initial min guard for balance without DME, progressing to supervision with RW    Ambulation/Gait Ambulation/Gait assistance: Supervision Gait Distance (Feet): 40 Feet Assistive device: Rolling walker (2 wheels) Gait Pattern/deviations: Step-through pattern, Decreased stride length Gait velocity: Decreased     General Gait Details: slow, steady gait with RW and supervision for safety/lines; cues to roll walker instead of picking it up each step; further mobility deferred to prioritize transfer training  Stairs            Wheelchair Mobility    Modified Rankin (Stroke Patients Only)       Balance Overall balance assessment: Needs assistance Sitting-balance support: No upper extremity supported, Feet supported Sitting balance-Leahy Scale: Good Sitting balance - Comments: able to pull up R sock but difficulty reaching feet due to pain at groin incision   Standing balance support: No upper extremity  supported, During functional activity Standing balance-Leahy  Scale: Fair Standing balance comment: can static stand and take steps without UE support, preference for RW                             Pertinent Vitals/Pain Pain Assessment Pain Assessment: Faces Faces Pain Scale: Hurts little more Pain Location: R groin incision Pain Descriptors / Indicators: Sore, Guarding Pain Intervention(s): Monitored during session    Home Living Family/patient expects to be discharged to:: Private residence Living Arrangements: Alone Available Help at Discharge: Family;Friend(s);Available PRN/intermittently Type of Home: House Home Access: Stairs to enter   Entrance Stairs-Number of Steps: 1 Alternate Level Stairs-Number of Steps: split level - 5 steps to each level Home Layout: Two level (split level) Home Equipment: Rolling Walker (2 wheels) Additional Comments: son to be in town through Monday? also has friend able to stay a couple days if needed. family already has meals prepared for her return home    Prior Function Prior Level of Function : Independent/Modified Independent;Driving             Mobility Comments: typically independent without DME, intermittent use of SPC; drives       Hand Dominance        Extremity/Trunk Assessment   Upper Extremity Assessment Upper Extremity Assessment: Overall WFL for tasks assessed    Lower Extremity Assessment Lower Extremity Assessment: Generalized weakness       Communication   Communication: No difficulties  Cognition Arousal/Alertness: Awake/alert Behavior During Therapy: WFL for tasks assessed/performed Overall Cognitive Status: Within Functional Limits for tasks assessed                                 General Comments: some delay in speech/getting words out, suspect baseline. demonstrates good awareness and problem solving in discussion regarding safety at home        General Comments General comments (skin integrity, edema, etc.): increased time  discussing safe return home, including mobility and ADL assist needs, DME needs and activity recommendations. pt adamant about return home, plans to use her RW for now, not interested in rollator after discussing this option.    Exercises     Assessment/Plan    PT Assessment Patient needs continued PT services  PT Problem List Decreased strength;Decreased activity tolerance;Decreased balance;Decreased mobility;Decreased knowledge of use of DME;Decreased knowledge of precautions;Pain       PT Treatment Interventions DME instruction;Gait training;Stair training;Functional mobility training;Therapeutic activities;Therapeutic exercise;Balance training;Patient/family education    PT Goals (Current goals can be found in the Care Plan section)  Acute Rehab PT Goals Patient Stated Goal: return home PT Goal Formulation: With patient Time For Goal Achievement: 09/19/22 Potential to Achieve Goals: Good    Frequency Min 3X/week     Co-evaluation               AM-PAC PT "6 Clicks" Mobility  Outcome Measure Help needed turning from your back to your side while in a flat bed without using bedrails?: None Help needed moving from lying on your back to sitting on the side of a flat bed without using bedrails?: A Little Help needed moving to and from a bed to a chair (including a wheelchair)?: A Little Help needed standing up from a chair using your arms (e.g., wheelchair or bedside chair)?: A Little Help needed to walk in hospital room?: A Little Help  needed climbing 3-5 steps with a railing? : A Little 6 Click Score: 19    End of Session Equipment Utilized During Treatment: Gait belt Activity Tolerance: Patient tolerated treatment well Patient left: in chair;with call bell/phone within reach Nurse Communication: Mobility status PT Visit Diagnosis: Other abnormalities of gait and mobility (R26.89);Pain Pain - Right/Left: Right    Time: 4356-8616 PT Time Calculation (min) (ACUTE  ONLY): 29 min   Charges:   PT Evaluation $PT Eval Moderate Complexity: 1 Mod PT Treatments $Self Care/Home Management: 8-22      Mabeline Caras, PT, DPT Acute Rehabilitation Services  Personal: [facility] Office: [place] 09/05/2022, 4:00 PM

## 2022-09-05 NOTE — Progress Notes (Addendum)
[place] VALVE TEAM  Patient Name: Traci [name] Date of Encounter: 09/05/2022  Admit date: 09/04/2022  Primary Care Provider: Burnard Bunting, MD [facility] Cardiologist: Sanda Klein, MD  / Dr. Burt Knack & Dr. Lavonna Monarch (TAVR) [place] Electrophysiologist:  None   Hospital Problem List     Principal Problem:   S/P TAVR (transcatheter aortic valve replacement) Active Problems:   Essential hypertension   Hyperlipidemia   Mild obesity   Severe aortic stenosis   Dissection of femoral artery (HCC)     Subjective   No complaints other than a little soreness in groin. Son at bedside.   Inpatient Medications    Scheduled Meds:  aspirin EC  81 mg Oral Daily   atorvastatin  20 mg Oral QHS   pantoprazole  40 mg Oral Daily   sodium chloride flush  3 mL Intravenous Q12H   Continuous Infusions:  sodium chloride     sodium chloride     nitroGLYCERIN     PRN Meds: sodium chloride, acetaminophen **OR** acetaminophen, morphine injection, ondansetron (ZOFRAN) IV, oxyCODONE, sodium chloride flush, traMADol   Vital Signs    Vitals:   09/04/22 1819 09/04/22 1954 09/04/22 2343 09/05/22 0300  BP: (!) 122/54 (!) 113/47 (!) 104/46 (!) 117/51  Pulse:  63 70 78  Resp:  '15 16 17  '$ Temp:  97.9 F (36.6 C) 98 F (36.7 C) 98 F (36.7 C)  TempSrc:  Oral Oral Oral  SpO2:  92% 92% 93%  Weight:      Height:        Intake/Output Summary (Last 24 hours) at 09/05/2022 0958 Last data filed at 09/05/2022 0256 Gross per 24 hour  Intake 2714.05 ml  Output 1650 ml  Net 1064.05 ml   Filed Weights   09/04/22 0807  Weight: 94.8 kg    Physical Exam    GEN: Well nourished, well developed, in no acute distress.  HEENT: Grossly normal.  Neck: Supple, no JVD, carotid bruits, or masses. Cardiac: RRR, no murmurs, rubs, or gallops. No clubbing, cyanosis, edema.   Respiratory:  Respirations regular and unlabored, clear to auscultation  bilaterally. GI: Soft, nontender, nondistended, BS + x 4. MS: no deformity or atrophy. Skin: warm and dry, no rash.  Groin sites clear without hematoma or ecchymosis. Wound vac in place on right Neuro:  Strength and sensation are intact. Psych: AAOx3.  Normal affect.  Labs    CBC Recent Labs    09/04/22 1601 09/05/22 0419  WBC  --  8.1  HGB 9.2* 8.9*  HCT 27.0* 27.7*  MCV  --  92.3  PLT  --  626   Basic Metabolic Panel Recent Labs    09/04/22 1601 09/05/22 0419  NA 146* 141  K 3.6 3.7  CL 107 109  CO2  --  24  GLUCOSE 128* 122*  BUN 14 14  CREATININE 0.80 0.94  CALCIUM  --  8.3*  MG  --  1.7   Liver Function Tests No results for input(s): "AST", "ALT", "ALKPHOS", "BILITOT", "PROT", "ALBUMIN" in the last 72 hours. No results for input(s): "LIPASE", "AMYLASE" in the last 72 hours. Cardiac Enzymes No results for input(s): "CKTOTAL", "CKMB", "CKMBINDEX", "TROPONINI" in the last 72 hours. BNP Invalid input(s): "POCBNP" D-Dimer No results for input(s): "DDIMER" in the last 72 hours. Hemoglobin A1C No results for input(s): "HGBA1C" in the last 72 hours. Fasting Lipid Panel No results for input(s): "CHOL", "HDL", "LDLCALC", "TRIG", "CHOLHDL", "  LDLDIRECT" in the last 72 hours. Thyroid Function Tests No results for input(s): "TSH", "T4TOTAL", "T3FREE", "THYROIDAB" in the last 72 hours.  Invalid input(s): "FREET3"  Telemetry    Sinus with brief run of SVT - Personally Reviewed  ECG    sinus - Personally Reviewed  Radiology    ECHOCARDIOGRAM LIMITED  Result Date: 09/04/2022    ECHOCARDIOGRAM LIMITED REPORT   Patient Name:   Traci [name] Date of Exam: 09/04/2022 Medical Rec #:  [id number]      Height:       67.0 in Accession #:    [id number]     Weight:       209.0 lb Date of Birth:  12-14-1940     BSA:          2.060 m Patient Age:    81 years       BP:           169/82 mmHg Patient Gender: F              HR:           63 bpm. Exam Location:  Inpatient  Procedure: Limited Echo, Cardiac Doppler and Color Doppler Indications:     Aortic valve stenosis, nonrheumatic [I35.0 (ICD-10-CM)]  History:         Patient has prior history of Echocardiogram examinations, most                  recent 04/10/2022. Risk Factors:Hypertension and Dyslipidemia.                  Aortic Valve: 26 mm Sapien prosthetic, stented (TAVR) valve is                  present in the aortic position. Procedure Date: 09/07/2022.  Sonographer:     Darlina Sicilian RDCS Referring Phys:  [id number] Woodfin Ganja THOMPSON Diagnosing Phys: Eleonore Chiquito MD IMPRESSIONS  1. Echo guided TAVR. 26 mm S3. Vmax 1.3 m/s, MG 3 mmHG, EOA 3.3 cm2, DI 0.80. No regurgitation or paravalvular leak. Normal prosthesis. There is a 26 mm Sapien prosthetic (TAVR) valve present in the aortic position. Procedure Date: 09/07/2022.  2. Left ventricular ejection fraction, by estimation, is 60 to 65%. The left ventricle has normal function.  3. Right ventricular systolic function is normal. The right ventricular size is normal.  4. The mitral valve is degenerative. Mild mitral valve regurgitation. No evidence of mitral stenosis. FINDINGS  Left Ventricle: Left ventricular ejection fraction, by estimation, is 60 to 65%. The left ventricle has normal function. Right Ventricle: The right ventricular size is normal. No increase in right ventricular wall thickness. Right ventricular systolic function is normal. Pericardium: Trivial pericardial effusion is present. Mitral Valve: The mitral valve is degenerative in appearance. Mild mitral valve regurgitation. No evidence of mitral valve stenosis. Aortic Valve: Echo guided TAVR. 26 mm S3. Vmax 1.3 m/s, MG 3 mmHG, EOA 3.3 cm2, DI 0.80. No regurgitation or paravalvular leak. Normal prosthesis. Aortic valve mean gradient measures 3.0 mmHg. Aortic valve peak gradient measures 7.6 mmHg. Aortic valve area, by VTI measures 3.30 cm. There is a 26 mm Sapien prosthetic, stented (TAVR) valve present in the  aortic position. Procedure Date: 09/07/2022. LEFT VENTRICLE PLAX 2D LVOT diam:     2.30 cm LV SV:         95 LV SV Index:   46 LVOT Area:     4.15 cm  AORTIC VALVE AV Area (Vmax):  3.10 cm AV Area (Vmean):   1.84 cm AV Area (VTI):     3.30 cm AV Vmax:           138.00 cm/s AV Vmean:          170.600 cm/s AV VTI:            0.288 m AV Peak Grad:      7.6 mmHg AV Mean Grad:      3.0 mmHg LVOT Vmax:         103.00 cm/s LVOT Vmean:        75.600 cm/s LVOT VTI:          0.229 m LVOT/AV VTI ratio: 0.80  SHUNTS Systemic VTI:  0.23 m Systemic Diam: 2.30 cm Eleonore Chiquito MD Electronically signed by Eleonore Chiquito MD Signature Date/Time: 09/04/2022/4:06:25 PM    Final    Structural Heart Procedure  Result Date: 09/04/2022 See surgical note for result.   Cardiac Studies   TAVR OPERATIVE NOTE     Date of Procedure:                09/04/2022   Preoperative Diagnosis:      Severe Aortic Stenosis    Postoperative Diagnosis:    Same    Procedure:        Transcatheter Aortic Valve Replacement - Percutaneous Right Transfemoral Approach             Edwards Sapien 3 Ultra THV (size 26 mm, model # 9755RSL, serial # [id number])        Repair of Right Femoral Dissection. (Performed and dictated by Dr Doren Custard)              Co-Surgeons:                        [name] MD and Sherren Mocha, MD       Anesthesiologist:                  Suella Broad MD   Echocardiographer:              Darlina Sicilian Echo tec   Pre-operative Echo Findings: Severe aortic stenosis Preserved  left ventricular systolic function   Post-operative Echo Findings: No paravalvular leak Preserved  left ventricular systolic function _____________     Echo 09/05/22: pending  Patient Profile    Traci [name] is a 81 y.o. female with a history of HTN, HLD and severe AS who presented to [facility] on 09/04/22 for planned TAVR.   Assessment & Plan    Severe AS: s/p successful TAVR with a 26 mm Edwards Sapien 3 THV via the TF  approach on 09/04/22. Post operative echo pending. Groin sites are stable. ECG with NSR and no high grade heart block. Continue home aspirin. Plan for mobilization and possible discharge home tomorrow. Will order PT consult to see if she could benefit from [place].  Right femoral artery dissection: TAVR was c/b a right femoral artery dissection and underwent successful CFA endarterectomy by Dr. Doren Custard. Continue wound vac x 1 week. Follow up with [facility].   HTN: BP well controlled.    HLD: continue statin.    Hydroureteronephrosis: pre TAVR CT showed mild bilateral hydroureteronephrosis, left greater than right, probably due to the small to moderate hydrocele (? In a woman). Suggest correlation with serum creatinine. Creat has been normal. Will send to urology as an outpatient.  Signed, Angelena Form, PA-C  09/05/2022,  9:58 AM  Pager [phone]  Patient seen, examined. Available data reviewed. Agree with findings, assessment, and plan as outlined by Nell Range, PA-C.  The patient is independently interviewed and examined.  She is alert, oriented, elderly woman in no distress.  Lungs are clear bilaterally, heart is regular rate and rhythm with a 2/6 ejection murmur at the right upper sternal border, abdomen is soft and nontender, bilateral groin sites are clear with and intact right femoral incision, no pretibial edema.  Overall the patient appears clinically stable.  We will check her postoperative day #1 echocardiogram today.  She has acute blood loss anemia but hemoglobin is stable at 9.2 this morning.  No signs of active bleeding.  Agree with physical therapy assessment and tentative plans for hospital discharge tomorrow.  Otherwise as outlined above.  Sherren Mocha, M.D. 09/05/2022 10:57 AM

## 2022-09-05 NOTE — Progress Notes (Signed)
   VASCULAR SURGERY ASSESSMENT & PLAN:   POD 1 RIGHT CFA ENDARTERECTOMY: Good Doppler signals right foot.  Arletha Pili has a good seal.  She will likely need a day to mobilize.  She will take the [place] off in 1 week.  We will arrange follow-up after discharge.   SUBJECTIVE:   No complaints this morning.  PHYSICAL EXAM:   Vitals:   09/04/22 1819 09/04/22 1954 09/04/22 2343 09/05/22 0300  BP: (!) 122/54 (!) 113/47 (!) 104/46 (!) 117/51  Pulse:  63 70 78  Resp:  '15 16 17  '$ Temp:  97.9 F (36.6 C) 98 F (36.7 C) 98 F (36.7 C)  TempSrc:  Oral Oral Oral  SpO2:  92% 92% 93%  Weight:      Height:       The Praveena on the right groin has a good seal. Good Doppler signals in the right foot.  LABS:   Lab Results  Component Value Date   WBC 8.1 09/05/2022   HGB 8.9 (L) 09/05/2022   HCT 27.7 (L) 09/05/2022   MCV 92.3 09/05/2022   PLT 162 09/05/2022   Lab Results  Component Value Date   CREATININE 0.94 09/05/2022   Lab Results  Component Value Date   INR 1.0 08/31/2022   CBG (last 3)  No results for input(s): "GLUCAP" in the last 72 hours.  PROBLEM LIST:    Principal Problem:   S/P TAVR (transcatheter aortic valve replacement) Active Problems:   Essential hypertension   Hyperlipidemia   Mild obesity   Severe aortic stenosis   Dissection of femoral artery (HCC)   CURRENT MEDS:    aspirin EC  81 mg Oral Daily   atorvastatin  20 mg Oral QHS   pantoprazole  40 mg Oral Daily   sodium chloride flush  3 mL Intravenous Q12H    [name] Office: [phone] 09/05/2022

## 2022-09-05 NOTE — Progress Notes (Addendum)
Mobility Specialist Progress Note:   09/05/22 0915  Mobility  Activity Ambulated with assistance in hallway  Level of Assistance Minimal assist, patient does 75% or more  Assistive Device Front wheel walker  Distance Ambulated (ft) 200 ft  Activity Response Tolerated well  Mobility Referral Yes  $Mobility charge 1 Mobility   Pt received in bed willing to participate in mobility. Complaints of 3/10 tightness in groin. Instructed pt on log rolling for comfort. MinA to stand then stand-by throughout ambulation. Left at sink with NT to get washed up.   [facility] Surveyor, mining Chat only

## 2022-09-05 NOTE — Progress Notes (Signed)
Echocardiogram 2D Echocardiogram has been performed.  Traci [name] 09/05/2022, 10:58 AM

## 2022-09-05 NOTE — Progress Notes (Signed)
Patient's son is very concerned about patient going home alone.  States she cannot get up without help.  Would like her to have some kind of rehab before going home alone if possible.

## 2022-09-05 NOTE — Progress Notes (Signed)
Pt ambulated with MT. Son and pt voice her main struggle is standing from low position. Practiced with pt from chair at sink. She was unable to stand by herself. Able to stand with min assist on second attempt. Discussed with son, who will return to [place] tomorrow. Encouraged him to talk with other son about being with her for a few days. She would also benefit from 3n1 at home to raise commode. Would benefit from HHPT for strengthening.  Discussed walking for exercise, restrictions, and CRPII. Will refer to [place] BS, ACSM-CEP 09/05/2022 9:30 AM

## 2022-09-06 LAB — ECHOCARDIOGRAM COMPLETE
AR max vel: 2.67 cm2
AV Area VTI: 3.3 cm2
AV Area mean vel: 2.97 cm2
AV Mean grad: 9.3 mmHg
AV Peak grad: 21.5 mmHg
Ao pk vel: 2.32 m/s
Area-P 1/2: 3.6 cm2
Calc EF: 69.9 %
Height: 67 in
S' Lateral: 2.8 cm
Single Plane A2C EF: 70.3 %
Single Plane A4C EF: 69.2 %
Weight: 3344 oz

## 2022-09-06 MED ORDER — TRAMADOL HCL 50 MG PO TABS: 50.0000 mg | ORAL_TABLET | Freq: Four times a day (QID) | ORAL | 0 refills | Status: DC | PRN

## 2022-09-06 NOTE — Evaluation (Signed)
Occupational Therapy Evaluation Patient Details Name: Traci [name] MRN: [id number] DOB: 1941-03-18 Today's Date: 09/06/2022   History of Present Illness Pt is an 81 y.o. female admitted 09/04/22 for same day TAVR complicated by R femoral artery dissection with successful CFA endarterectomy. PMH includes HTN, obesity.   Clinical Impression   PTA patient independent with ADLs and mobility, driving. Admitted for above and limited by problem list below, including generalized weakness, decreased activity tolerance, balance and pain. She presents with some slow processing, but functional.  Completes ADLs with supervision for UB, mod assist for LB and toileting without assist, transfers with supervision using RW given intermittent cueing for hand placement, and bed mobility with min guard.  Discussed compensatory techniques for ADLs, energy conservation and DME recommendations.  Spoke to sons about bathroom setup with 3:1 over commode, using RW for mobility, having home health come out before showering (once cleared by MD) and completing basin bathing at this point. Patient will benefit from further OT services acutely and after dc at [facility] level to optimize independence, safety and return to PLOF. Hopeful for 1 more OT session before dc to review LB AE.  Will follow.      Recommendations for follow up therapy are one component of a multi-disciplinary discharge planning process, led by the attending physician.  Recommendations may be updated based on patient status, additional functional criteria and insurance authorization.   Follow Up Recommendations  Home health OT    Assistance Recommended at Discharge Intermittent Supervision/Assistance  Patient can return home with the following A little help with bathing/dressing/bathroom;Assistance with cooking/housework;Assist for transportation;Help with stairs or ramp for entrance    Functional Status Assessment  Patient has had a recent decline in their  functional status and demonstrates the ability to make significant improvements in function in a reasonable and predictable amount of time.  Equipment Recommendations  BSC/3in1    Recommendations for Other Services       Precautions / Restrictions Precautions Precautions: Fall;Other (comment) Precaution Comments: R groin wound vac Restrictions Weight Bearing Restrictions: No      Mobility Bed Mobility Overal bed mobility: Needs Assistance Bed Mobility: Sit to Supine       Sit to supine: Min guard   General bed mobility comments: cueing for technique, placed hand under pts shoulder and she came to ascend without physical assist.  educated on possible rails for home    Transfers Overall transfer level: Needs assistance Equipment used: Rolling walker (2 wheels) Transfers: Sit to/from Stand Sit to Stand: Supervision           General transfer comment: pt scooting forward without cueing, intermittent cueing for hand placement on bed vs RW.  Completed all transfers without physical assist.      Balance Overall balance assessment: Needs assistance Sitting-balance support: No upper extremity supported, Feet supported Sitting balance-Leahy Scale: Good     Standing balance support: Bilateral upper extremity supported, No upper extremity supported, During functional activity Standing balance-Leahy Scale: Fair Standing balance comment: able to engage in ADLs without UE support but relies on BUE support using RW                           ADL either performed or assessed with clinical judgement   ADL Overall ADL's : Needs assistance/impaired     Grooming: Supervision/safety;Wash/dry hands;Standing           Upper Body Dressing : Set up;Sitting   Lower Body  Dressing: Sit to/from stand;Moderate assistance Lower Body Dressing Details (indicate cue type and reason): requires assist to manage socks and shoes, will benefit from further AE education Toilet  Transfer: Ambulation;Supervision/safety;BSC/3in1;Rolling walker (2 wheels);Regular Glass blower/designer Details (indicate cue type and reason): 3:1 over commode, pt navigating through narrow spaces without assist Toileting- Clothing Manipulation and Hygiene: Modified independent;Sit to/from stand       Functional mobility during ADLs: Supervision/safety;Rolling walker (2 wheels);Cueing for safety;Cueing for sequencing General ADL Comments: pt with minimal cueing required intermittently for hand placement during transfers.  moves slow but functional     Vision   Vision Assessment?: No apparent visual deficits     Perception     Praxis      Pertinent Vitals/Pain Pain Assessment Pain Assessment: Faces Faces Pain Scale: Hurts a little bit Pain Location: R groin incision Pain Descriptors / Indicators: Sore, Guarding Pain Intervention(s): Limited activity within patient's tolerance, Monitored during session, Repositioned     Hand Dominance     Extremity/Trunk Assessment Upper Extremity Assessment Upper Extremity Assessment: Generalized weakness   Lower Extremity Assessment Lower Extremity Assessment: Defer to PT evaluation       Communication Communication Communication: No difficulties   Cognition Arousal/Alertness: Awake/alert Behavior During Therapy: WFL for tasks assessed/performed Overall Cognitive Status: Within Functional Limits for tasks assessed                                 General Comments: pt with some slow processing, able to problem solve through tasks in room with increased time.  pt focused on goal of getting home.     General Comments  pt sons present, discussed recommendations, safety, fall prevention, DME, meal planning, energy conservation and recommendations. No further questions or concerns.    Exercises     Shoulder Instructions      Home Living Family/patient expects to be discharged to:: Private residence Living  Arrangements: Alone Available Help at Discharge: Family;Friend(s);Available PRN/intermittently Type of Home: House Home Access: Stairs to enter Entrance Stairs-Number of Steps: 1   Home Layout: Two level (split level) Alternate Level Stairs-Number of Steps: split level - 5 steps to each level Alternate Level Stairs-Rails: Right;Left Bathroom Shower/Tub: Occupational psychologist: Standard     Home Equipment: Conservation officer, nature (2 wheels)   Additional Comments: son to be in town through Monday? also has friend able to stay a couple days if needed. family already has meals prepared for her return home      Prior Functioning/Environment Prior Level of Function : Independent/Modified Independent;Driving             Mobility Comments: typically independent without DME, intermittent use of SPC; drives          OT Problem List: Decreased activity tolerance;Impaired balance (sitting and/or standing);Decreased safety awareness;Decreased knowledge of precautions;Decreased knowledge of use of DME or AE;Pain;Decreased strength      OT Treatment/Interventions: Self-care/ADL training;Energy conservation;DME and/or AE instruction;Therapeutic activities;Cognitive remediation/compensation;Patient/family education;Balance training    OT Goals(Current goals can be found in the care plan section) Acute Rehab OT Goals Patient Stated Goal: home OT Goal Formulation: With patient Time For Goal Achievement: 09/20/22 Potential to Achieve Goals: Good  OT Frequency: Min 2X/week    Co-evaluation              AM-PAC OT "6 Clicks" Daily Activity     Outcome Measure Help from another person eating meals?: None  Help from another person taking care of personal grooming?: A Little Help from another person toileting, which includes using toliet, bedpan, or urinal?: None Help from another person bathing (including washing, rinsing, drying)?: A Little Help from another person to put on and  taking off regular upper body clothing?: A Little Help from another person to put on and taking off regular lower body clothing?: A Little 6 Click Score: 20   End of Session Equipment Utilized During Treatment: Rolling walker (2 wheels);Other (comment) (wound vac) Nurse Communication: Mobility status  Activity Tolerance: Patient tolerated treatment well Patient left: with call bell/phone within reach;in chair;with family/visitor present  OT Visit Diagnosis: Other abnormalities of gait and mobility (R26.89);Muscle weakness (generalized) (M62.81);Pain Pain - Right/Left: Right Pain - part of body:  (groin)                Time: 2423-5361 OT Time Calculation (min): 41 min Charges:  OT General Charges $OT Visit: 1 Visit OT Evaluation $OT Eval Moderate Complexity: 1 Mod OT Treatments $Self Care/Home Management : 23-37 mins  Traci [name], OT Acute Rehabilitation Services Office [phone]   Traci [name] 09/06/2022, 10:57 AM

## 2022-09-06 NOTE — Progress Notes (Signed)
Physical Therapy Treatment Patient Details Name: Traci [name] MRN: [id number] DOB: Sep 26, 1941 Today's Date: 09/06/2022   History of Present Illness Pt is an 81 y.o. female admitted 09/04/22 for same day TAVR complicated by R femoral artery dissection with successful CFA endarterectomy. PMH includes HTN, obesity.   PT Comments    Pt progressing with mobility. Today's session focused on stair training; son present for education. Pt preparing for d/c home, reviewed education and activity recommendations; pt and son report no further questions or concerns. If to remain admitted, will continue to follow acutely to address established goals.    Recommendations for follow up therapy are one component of a multi-disciplinary discharge planning process, led by the attending physician.  Recommendations may be updated based on patient status, additional functional criteria and insurance authorization.  Follow Up Recommendations  Home health PT     Assistance Recommended at Discharge Intermittent Supervision/Assistance  Patient can return home with the following A little help with walking and/or transfers;A little help with bathing/dressing/bathroom;Assistance with cooking/housework;Assist for transportation;Help with stairs or ramp for entrance   Equipment Recommendations  BSC/3in1    Recommendations for Other Services       Precautions / Restrictions Precautions Precautions: Fall;Other (comment) Precaution Comments: R groin wound vac Restrictions Weight Bearing Restrictions: No     Mobility  Bed Mobility Overal bed mobility: Needs Assistance Bed Mobility: Sit to Supine       Sit to supine: Supervision   General bed mobility comments: received standing at sink without DME    Transfers Overall transfer level: Needs assistance Equipment used: Rolling walker (2 wheels) Transfers: Sit to/from Stand Sit to Stand: Supervision           General transfer comment: standing  [facility] (over toilet) 1x and transport chair 2x with DME, supervision for safety    Ambulation/Gait Ambulation/Gait assistance: Supervision Gait Distance (Feet): 60 Feet Assistive device: None Gait Pattern/deviations: Step-through pattern, Decreased stride length, Trunk flexed Gait velocity: Decreased     General Gait Details: slow, mildly unsteady gait throughout room without DME, pt reaching to furniture at times for added UE support   Stairs Stairs: Yes Stairs assistance: Supervision Stair Management: Step to pattern, Forwards, Two rails Number of Stairs: 5 General stair comments: ascend/descend 5 steps with BUE rail support, supervision for safety; son present   Wheelchair Mobility    Modified Rankin (Stroke Patients Only)       Balance Overall balance assessment: Needs assistance Sitting-balance support: No upper extremity supported, Feet supported Sitting balance-Leahy Scale: Good Sitting balance - Comments: able to pull up R sock but difficulty reaching feet due to pain at groin incision   Standing balance support: Bilateral upper extremity supported, No upper extremity supported, During functional activity Standing balance-Leahy Scale: Fair Standing balance comment: can ambulate without DME, preference for UE support to stabilize                            Cognition Arousal/Alertness: Awake/alert Behavior During Therapy: WFL for tasks assessed/performed Overall Cognitive Status: Within Functional Limits for tasks assessed                                 General Comments: pt with some slow processing, able to problem solve through tasks in room with increased time.  pt focused on goal of getting home.  Exercises      General Comments General comments (skin integrity, edema, etc.): pt's son Traci [name]) present and supportive, observed session and receptive to education. pt preparing for d/c home today - reviewed all educ, BSC  adjusted for pt's height      Pertinent Vitals/Pain Pain Assessment Pain Assessment: Faces Faces Pain Scale: Hurts a little bit Pain Location: R groin incision Pain Descriptors / Indicators: Sore, Guarding Pain Intervention(s): Monitored during session    Home Living                          Prior Function            PT Goals (current goals can now be found in the care plan section) Progress towards PT goals: Progressing toward goals    Frequency    Min 3X/week      PT Plan Current plan remains appropriate    Co-evaluation              AM-PAC PT "6 Clicks" Mobility   Outcome Measure  Help needed turning from your back to your side while in a flat bed without using bedrails?: None Help needed moving from lying on your back to sitting on the side of a flat bed without using bedrails?: A Little Help needed moving to and from a bed to a chair (including a wheelchair)?: A Little Help needed standing up from a chair using your arms (e.g., wheelchair or bedside chair)?: A Little Help needed to walk in hospital room?: A Little Help needed climbing 3-5 steps with a railing? : A Little 6 Click Score: 19    End of Session Equipment Utilized During Treatment: Gait belt Activity Tolerance: Patient tolerated treatment well Patient left: in chair;with call bell/phone within reach;with family/visitor present Nurse Communication: Mobility status PT Visit Diagnosis: Other abnormalities of gait and mobility (R26.89);Pain     Time: 6283-6629 PT Time Calculation (min) (ACUTE ONLY): 15 min  Charges:  $Gait Training: 8-22 mins                     Mabeline Caras, PT, DPT Acute Rehabilitation Services  Personal: [facility] Office: [place] 09/06/2022, 5:00 PM

## 2022-09-06 NOTE — Progress Notes (Signed)
Discharge instructions discussed with patient.  Patient instructed on home medications, restrictions, and follow up appointments. Belongings gathered and sent with patient.  Patients medications sent to [facility]   Patient discharged via wheelchair by this writer.

## 2022-09-06 NOTE — Progress Notes (Signed)
   VASCULAR SURGERY ASSESSMENT & PLAN:   POD 2 RIGHT CFA ENDARTERECTOMY:   Praveena has a good seal.  She   She will take the [place] off in 1 week.  We will arrange follow-up after discharge.   SUBJECTIVE:   No specific complaints except some difficulty getting in and out of bed.  PHYSICAL EXAM:   Vitals:   09/05/22 1959 09/05/22 2306 09/05/22 2310 09/06/22 0327  BP: (!) 123/45 (!) 110/44  (!) 121/53  Pulse: 94 85  88  Resp: (!) 23 (!) '27 18 20  '$ Temp: 98.2 F (36.8 C) 99.4 F (37.4 C)  97.6 F (36.4 C)  TempSrc: Oral Oral  Oral  SpO2: 95% 92%  95%  Weight:      Height:       Palpable right dorsalis pedis pulse. The Praveena has a good seal.  LABS:   Lab Results  Component Value Date   WBC 8.1 09/05/2022   HGB 8.9 (L) 09/05/2022   HCT 27.7 (L) 09/05/2022   MCV 92.3 09/05/2022   PLT 162 09/05/2022   Lab Results  Component Value Date   CREATININE 0.94 09/05/2022   Lab Results  Component Value Date   INR 1.0 08/31/2022    PROBLEM LIST:    Principal Problem:   S/P TAVR (transcatheter aortic valve replacement) Active Problems:   Essential hypertension   Hyperlipidemia   Mild obesity   Severe aortic stenosis   Dissection of femoral artery (HCC)   CURRENT MEDS:    aspirin EC  81 mg Oral Daily   atorvastatin  20 mg Oral QHS   pantoprazole  40 mg Oral Daily   sodium chloride flush  3 mL Intravenous Q12H    [name] Office: [phone] 09/06/2022

## 2022-09-06 NOTE — Progress Notes (Signed)
Patient was able to get up independently and walk herself to the bathroom.  Patient stated that, "she didn't have issues doing so".   Will continue to monitor

## 2022-09-06 NOTE — Progress Notes (Deleted)
Patient continues to struggle getting up from lying position to sitting position. Sometimes it takes a couple of attempts to stand. Once standing patient is more effective.  Patient is not safe to go home alone. Patient will need 24/7 supervision until she can get her strength back.  If there is no family available to do this then maybe another option is warranted??  Thank you, nursing

## 2022-09-06 NOTE — Progress Notes (Signed)
Occupational Therapy Treatment Patient Details Name: Traci [name] MRN: [id number] DOB: [birth year] Today's Date: 09/06/2022   History of present illness Pt is an 81 y.o. female admitted 09/04/22 for same day TAVR complicated by R femoral artery dissection with successful CFA endarterectomy. PMH includes HTN, obesity.   OT comments  Patient seated in recliner. Reviewed AE, with good return demonstration of use to optimize independence for LB bathing and dressing.  Provided handout for reference.  Pt/son with no further questions or concerns.    Recommendations for follow up therapy are one component of a multi-disciplinary discharge planning process, led by the attending physician.  Recommendations may be updated based on patient status, additional functional criteria and insurance authorization.    Follow Up Recommendations  Home health OT    Assistance Recommended at Discharge Intermittent Supervision/Assistance  Patient can return home with the following  A little help with bathing/dressing/bathroom;Assistance with cooking/housework;Assist for transportation;Help with stairs or ramp for entrance   Equipment Recommendations  BSC/3in1    Recommendations for Other Services      Precautions / Restrictions Precautions Precautions: Fall;Other (comment) Precaution Comments: R groin wound vac Restrictions Weight Bearing Restrictions: No       Mobility Bed Mobility    General bed mobility comments: up in recliner    Transfers      Balance Overall balance assessment: Needs assistance Sitting-balance support: No upper extremity supported, Feet supported Sitting balance-Leahy Scale: Good                                ADL either performed or assessed with clinical judgement   ADL Overall ADL's : Needs assistance/impaired            Lower Body Bathing: Supervison/ safety;Sit to/from stand Lower Body Bathing Details (indicate cue type and reason):  reviewed use of long handled sponge for bathing LB, pt voiced understanding    Lower Body Dressing: Supervision/safety;Cueing for compensatory techniques;Sit to/from stand;With adaptive equipment Lower Body Dressing Details (indicate cue type and reason): educated on AE for LB dressing, completing with supervision. able to return demonstrate technique but will benefit from further practice         General ADL Comments: focused on AE, provided handout for reference    Extremity/Trunk Assessment Upper Extremity Assessment Upper Extremity Assessment: Generalized weakness   Lower Extremity Assessment Lower Extremity Assessment: Defer to PT evaluation        Vision   Vision Assessment?: No apparent visual deficits   Perception     Praxis      Cognition Arousal/Alertness: Awake/alert Behavior During Therapy: WFL for tasks assessed/performed Overall Cognitive Status: Within Functional Limits for tasks assessed                                 General Comments: pt with some slow processing, able to problem solve through tasks in room with increased time.  pt focused on goal of getting home.        Exercises      Shoulder Instructions       General Comments son present and suppportive    Pertinent Vitals/ Pain       Pain Assessment Pain Assessment: Faces Faces Pain Scale: No hurt Pain Location: R groin incision Pain Descriptors / Indicators: Sore, Guarding Pain Intervention(s): Limited activity within patient's tolerance, Monitored during session, Repositioned  Home  Living Family/patient expects to be discharged to:: Private residence Living Arrangements: Alone Available Help at Discharge: Family;Friend(s);Available PRN/intermittently Type of Home: House Home Access: Stairs to enter Entrance Stairs-Number of Steps: 1   Home Layout: Two level (split level) Alternate Level Stairs-Number of Steps: split level - 5 steps to each level Alternate Level  Stairs-Rails: Right;Left Bathroom Shower/Tub: Occupational psychologist: Standard     Home Equipment: Conservation officer, nature (2 wheels)   Additional Comments: son to be in town through Monday? also has friend able to stay a couple days if needed. family already has meals prepared for her return home      Prior Functioning/Environment              Frequency  Min 2X/week        Progress Toward Goals  OT Goals(current goals can now be found in the care plan section)  Progress towards OT goals: Progressing toward goals  Acute Rehab OT Goals Patient Stated Goal: home OT Goal Formulation: With patient Time For Goal Achievement: 09/20/22 Potential to Achieve Goals: Good ADL Goals Pt Will Perform Grooming: with modified independence;standing Pt Will Perform Lower Body Dressing: with modified independence;sit to/from stand;with adaptive equipment Pt Will Transfer to Toilet: with modified independence;ambulating;bedside commode Additional ADL Goal #1: Pt will verbalize 3 fall prevention techniques to utilize during daily routine to optimize safety.  Plan Discharge plan remains appropriate;Frequency remains appropriate    Co-evaluation                 AM-PAC OT "6 Clicks" Daily Activity     Outcome Measure   Help from another person eating meals?: None Help from another person taking care of personal grooming?: A Little Help from another person toileting, which includes using toliet, bedpan, or urinal?: None Help from another person bathing (including washing, rinsing, drying)?: A Little Help from another person to put on and taking off regular upper body clothing?: A Little Help from another person to put on and taking off regular lower body clothing?: A Little 6 Click Score: 20    End of Session Equipment Utilized During Treatment: Rolling walker (2 wheels);Other (comment) (wound vac)  OT Visit Diagnosis: Other abnormalities of gait and mobility (R26.89);Muscle  weakness (generalized) (M62.81);Pain Pain - Right/Left: Right Pain - part of body:  (groin)   Activity Tolerance Patient tolerated treatment well   Patient Left in chair;with call bell/phone within reach;with family/visitor present   Nurse Communication Mobility status        Time: 2863-8177 OT Time Calculation (min): 16 min  Charges: OT General Charges $OT Visit: 1 Visit OT Evaluation $OT Eval Moderate Complexity: 1 Mod OT Treatments $Self Care/Home Management : 8-22 mins  Jolaine Artist, OT Acute Rehabilitation Services Office [phone]   Delight Stare 09/06/2022, 11:56 AM

## 2022-09-06 NOTE — TOC Transition Note (Addendum)
Transition of Care (TOC) - CM/SW Discharge Note Marvetta Gibbons RN, BSN Transitions of Care Unit 4E- RN Case Manager See Treatment Team for direct phone #    Patient Details  Name: Traci [name] MRN: [id number] Date of Birth: [birth year]  Transition of Care [facility]) CM/SW Contact:  Dawayne Patricia, RN Phone Number: 09/06/2022, 11:21 AM   Clinical Narrative:    Pt stable for transition home today, Orders placed for [facility] and DME needs.  CM in to speak with pt , sons also present at bedside.  Discussed transition needs for [facility] and DME. Per sons, Ronalee Belts will be here local to assist,  other son will be traveling back to [place].  List provided for [facility] choice Per CMS guidelines from [employer] website with star ratings (copy placed in shadow chart), after review they have selected [facility] as first choice, [facility] as backup- have explained [facility] may not be in network with insurance.  Address, phone # and PCP all confirmed in epic. Sons to transport home today.   Choice offered for DME needs, pt agreeable to use in house provider, Order placed for [facility]- referral has been placed in [employer] parachute system and once processed will be delivered to room prior to discharge.   Call made to [facility] to check on referral- they are unable to accept.  Call made to [facility]- referral has been accepted for HHPT/OT needs. Pt will go home w/ Prevena wound VAC- no need for HHRN.   RNCM will sign off for now as intervention is no longer needed. Please re-consult  if new needs arise, or contact RNCM assigned to treatment team for further questions/concerns.      Final next level of care: [place] Barriers to Discharge: No Barriers Identified   Patient Goals and CMS Choice Patient states their goals for this hospitalization and ongoing recovery are:: return home CMS Medicare.gov Compare Post Acute Care list provided to:: Patient Choice offered to / list presented to :  Patient, Adult Children  Discharge Placement               Home w/ [facility]        Discharge Plan and Services   Discharge Planning Services: CM Consult Post Acute Care Choice: Durable Medical Equipment, Home Health          DME Arranged: Bedside commode DME Agency: [facility] Date DME Agency Contacted: 09/06/22 Time DME Agency Contacted: [phone] Representative spoke with at DME Agency: [place] system HH Arranged: PT, OT [place] Agency: [place] Date [place]: 09/06/22 Time [place]: 1120 Representative spoke with at [place]: [place] ([place]) Interventions     Readmission Risk Interventions    09/06/2022   11:21 AM  Readmission Risk Prevention Plan  Post Dischage Appt Complete  Medication Screening Complete  Transportation Screening Complete

## 2022-09-06 NOTE — Progress Notes (Addendum)
CARDIAC REHAB PHASE I   PRE:  Rate/Rhythm: 88 SR  BP:  Sitting: 131/51   SaO2: not taken  MODE:  Ambulation: 300 ft   POST:  Rate/Rhythm: 104 SR  BP:  Sitting: 140/50   SaO2: not taken  Pt received in recliner, agreeable to ambulation. Pt to standing from sitting in recliner with min assist. Pt ambulated in hallway using RW and gait belt utilized for contact guard. Pt tolerated ambulation well with no s/s or c/o. Pt returned to recliner, no further questions about education reviewed yesterday, left in recliner with call bell in reach.  [phone]   Colbert Ewing, MS 09/06/2022 11:09 AM

## 2022-09-06 NOTE — Progress Notes (Signed)
Physical Therapy Treatment Patient Details Name: Traci [name] MRN: [id number] DOB: 1940-12-18 Today's Date: 09/06/2022   History of Present Illness Pt is an 81 y.o. female admitted 09/04/22 for same day TAVR complicated by R femoral artery dissection with successful CFA endarterectomy. PMH includes HTN, obesity.   PT Comments    Pt progressing with mobility. Today's session focused on bed mobility and transfers, as these are the movements pt has most difficulty with due to R groin discomfort and generalized weakness. Pt ultimately mobilizing without physical assist, though likely to need increased support from family for ADL/iADL tasks. Pt's sons present and supportive, increased time discussing d/c recommendations and safe return home - their main concern is pt's safety since she is typically home alone, son only able to stay through the weekend. Hopeful to have additional acute PT session before pt to d/c home today.    Recommendations for follow up therapy are one component of a multi-disciplinary discharge planning process, led by the attending physician.  Recommendations may be updated based on patient status, additional functional criteria and insurance authorization.  Follow Up Recommendations  Home health PT     Assistance Recommended at Discharge Intermittent Supervision/Assistance  Patient can return home with the following A little help with walking and/or transfers;A little help with bathing/dressing/bathroom;Assistance with cooking/housework;Assist for transportation;Help with stairs or ramp for entrance   Equipment Recommendations  BSC/3in1    Recommendations for Other Services       Precautions / Restrictions Precautions Precautions: Fall;Other (comment) Precaution Comments: R groin wound vac Restrictions Weight Bearing Restrictions: No     Mobility  Bed Mobility Overal bed mobility: Needs Assistance Bed Mobility: Sit to Supine       Sit to supine:  Supervision   General bed mobility comments: bed flat, significant increased time and effort for return to supine without assist; trialled use of strap to assist RLE into bed which helped some, but pt preference to go without; reliant on momentum    Transfers Overall transfer level: Needs assistance Equipment used: Rolling walker (2 wheels) Transfers: Sit to/from Stand Sit to Stand: Supervision           General transfer comment: initial cues for scooting forward and hand placement prior to standing, intermittent reminder of hand placement; increased time/effort for initial stand from recliner to RW, improved standing from [facility] (over toilet)    Ambulation/Gait Ambulation/Gait assistance: Supervision Gait Distance (Feet): 40 Feet Assistive device: Rolling walker (2 wheels) Gait Pattern/deviations: Step-through pattern, Decreased stride length Gait velocity: Decreased     General Gait Details: slow, steady gait with RW and supervision for safety/lines; cues to roll walker instead of picking it up each step, pt still preferring to pick it up; further mobility deferred to prioritize transfer and bed mobility training   Stairs             Wheelchair Mobility    Modified Rankin (Stroke Patients Only)       Balance Overall balance assessment: Needs assistance Sitting-balance support: No upper extremity supported, Feet supported Sitting balance-Leahy Scale: Good Sitting balance - Comments: able to pull up R sock but difficulty reaching feet due to pain at groin incision   Standing balance support: No upper extremity supported, During functional activity Standing balance-Leahy Scale: Fair Standing balance comment: can static stand and take steps without UE support, preference for RW  Cognition Arousal/Alertness: Awake/alert Behavior During Therapy: WFL for tasks assessed/performed Overall Cognitive Status: Within Functional Limits  for tasks assessed                                 General Comments: some delay in speech/getting words out, suspect baseline. focused on goal of getting home; when asked about potential mobility issues at home, pt states "It will all have to work out"        Exercises      General Comments General comments (skin integrity, edema, etc.): pt's sons Ronalee Belts, [place]) present and supportive - significant increased time discussing d/c recommendations with pt and family, including return home, assist needs, mobility strategies, fall risk reduction, increased support from family, etc. Pt's son Marden Noble able to assist over weekend until he starts back at work next Monday      Pertinent Vitals/Pain Pain Assessment Pain Assessment: Faces Faces Pain Scale: Hurts little more Pain Location: R groin incision Pain Descriptors / Indicators: Sore, Guarding Pain Intervention(s): Monitored during session    [place] expects to be discharged to:: Private residence Living Arrangements: Alone Available Help at Discharge: Family;Friend(s);Available PRN/intermittently Type of Home: House Home Access: Stairs to enter   Entrance Stairs-Number of Steps: 1 Alternate Level Stairs-Number of Steps: split level - 5 steps to each level Home Layout: Two level (split level) Home Equipment: Rolling Walker (2 wheels) Additional Comments: son to be in town through Monday? also has friend able to stay a couple days if needed. family already has meals prepared for her return home    Prior Function            PT Goals (current goals can now be found in the care plan section) Acute Rehab PT Goals Patient Stated Goal: return home Progress towards PT goals: Progressing toward goals    Frequency    Min 3X/week      PT Plan Current plan remains appropriate    Co-evaluation              AM-PAC PT "6 Clicks" Mobility   Outcome Measure  Help needed turning from your back to  your side while in a flat bed without using bedrails?: None Help needed moving from lying on your back to sitting on the side of a flat bed without using bedrails?: A Little Help needed moving to and from a bed to a chair (including a wheelchair)?: A Little Help needed standing up from a chair using your arms (e.g., wheelchair or bedside chair)?: A Little Help needed to walk in hospital room?: A Little Help needed climbing 3-5 steps with a railing? : A Little 6 Click Score: 19    End of Session Equipment Utilized During Treatment: Gait belt Activity Tolerance: Patient tolerated treatment well Patient left: in bed;with family/visitor present;with nursing/sitter in room Nurse Communication: Mobility status PT Visit Diagnosis: Other abnormalities of gait and mobility (R26.89);Pain Pain - Right/Left: Right Pain - part of body: Hip     Time: 8115-7262 PT Time Calculation (min) (ACUTE ONLY): 35 min  Charges:  $Therapeutic Activity: 8-22 mins $Self Care/Home Management: [place], PT, DPT Acute Rehabilitation Services  Personal: [place] [facility] Office: [place] 09/06/2022, 9:39 AM

## 2022-09-06 NOTE — Plan of Care (Signed)
  Problem: Health Behavior/Discharge Planning: Goal: Ability to manage health-related needs will improve Outcome: Progressing   Problem: Clinical Measurements: Goal: Will remain free from infection Outcome: Progressing   

## 2022-09-07 ENCOUNTER — Telehealth: Payer: Self-pay | Admitting: Physician Assistant

## 2022-09-07 NOTE — Telephone Encounter (Signed)
  [place] VALVE TEAM   Patient contacted regarding discharge from [facility] on 10/26  Patient understands to follow up with a structural heart APP on 11/1 at [place].  Patient understands discharge instructions? yes Patient understands medications and regimen? yes Patient understands to bring all medications to this visit? yes  Angelena Form PA-C  [facility]

## 2022-09-07 NOTE — Progress Notes (Unsigned)
[name]                                     Cardiology Office Note:    Date:  09/12/2022   ID:  TRENITY PHA, DOB [birth year], MRN [id number]  PCP:  Burnard Bunting, MD  [facility] Cardiologist:  Sanda Klein, MD / Dr. Burt Knack, MD and Dr. Lavonna Monarch, MD (TAVR) Traci [name] [facility] Electrophysiologist:  None   Referring MD: Burnard Bunting, MD   Chief Complaint  Patient presents with   Follow-up    TOC follow up s/p TAVR   History of Present Illness:    Traci [name] is a 81 y.o. female with a hx of  HTN, HLD and severe AS who presented to [facility] on 09/04/22 for planned TAVR.    Traci [name] is followed by Dr. Sallyanne Kuster for her cardiology care who has been following her mild to moderate aortic stenosis for quite some time. Recent echocardiogram showed progression of her aortic stenosis, gradient is moderate with a mean gradient of 29 mm and calculated aortic valve area of 1.2 cm. However because of progressive symptoms, she was referred for cardiac catheterization which demonstrated a higher transaortic gradient of 37 mmHg mean and no obstructive CAD. She began having progressive symptoms of fatigue and exertional dyspnea with most activities over the last 3-4 months without other explanation.    The patient was evaluated by the multidisciplinary valve team and felt to have severe, symptomatic aortic stenosis and to be a suitable candidate for TAVR.   She is now s/p successful TAVR with a 26 mm Edwards Sapien 3 THV via the TF approach on 09/04/22. Post operative echo showed stable valve placement with 30m S3UR, mean gradient at 966mg, EOA 3.30cm2, and DI 0.75. TAVR was c/b a right femoral artery dissection and she underwent successful CFA endarterectomy by Dr. DiDoren CustardWound vac was placed with instructions to keep in place for one week until battery dies then can remove.   She is here today with her son. She states that overall she is  doing well with minimal groin pain. Wound vac died several days ago however she left vac in place until today. Incision site is without redness, oozing with great approximation. She denies SOB, chest pain, palpitations, orthopnea, dizziness, bleeding or syncope. She has not been very active since returning home given her groin.   Past Medical History:  Diagnosis Date   GERD (gastroesophageal reflux disease)    High cholesterol    Hypertension    Irregular heart rate    S/P TAVR (transcatheter aortic valve replacement) 09/04/2022   s/p TAVR with a 2650mdwards S3UR via the TF approach by Dr. CooBurt KnackDr. WelLavonna Monarch Past Surgical History:  Procedure Laterality Date   APPLICATION OF WOUND VAC Right 09/04/2022   Procedure: APPLICATION OF WOUND VAC;  Surgeon: DicAngelia MouldD;  [facility];  Laterality: Right;   CHOLECYSTECTOMY     DILATION AND CURETTAGE OF UTERUS     ENDARTERECTOMY FEMORAL Right 09/04/2022   Procedure: ENDARTERECTOMY FEMORAL;  Surgeon: DicAngelia MouldD;  [facility] [place];  Laterality: Right;   GROIN DISSECTION Right 09/04/2022   Procedure: GROVirl SonPLORATION;  Surgeon: DicAngelia MouldD;  [facility] [place];  Laterality: Right;   INTRAOPERATIVE TRANSTHORACIC  ECHOCARDIOGRAM N/A 09/04/2022   Procedure: INTRAOPERATIVE TRANSTHORACIC ECHOCARDIOGRAM;  Surgeon: Sherren Mocha, MD;  Location: [place];  Service: Open Heart Surgery;  Laterality: N/A;   PATCH ANGIOPLASTY Right 09/04/2022   Procedure: BOVINE PATCH ANGIOPLASTY;  Surgeon: Angelia Mould, MD;  Location: [name];  Service: Vascular;  Laterality: Right;   RIGHT/LEFT HEART CATH AND CORONARY ANGIOGRAPHY N/A 06/20/2022   Procedure: RIGHT/LEFT HEART CATH AND CORONARY ANGIOGRAPHY;  Surgeon: Burnell Blanks, MD;  Location: [place] CV [facility];  Service: Cardiovascular;  Laterality: N/A;   TRANSCATHETER AORTIC VALVE REPLACEMENT,  TRANSFEMORAL N/A 09/04/2022   Procedure: Transcatheter Aortic Valve Replacement, Transfemoral;  Surgeon: Sherren Mocha, MD;  Location: [place];  Service: Open Heart Surgery;  Laterality: N/A;   TUBAL LIGATION     ULTRASOUND GUIDANCE FOR VASCULAR ACCESS Bilateral 09/04/2022   Procedure: ULTRASOUND GUIDANCE FOR VASCULAR ACCESS;  Surgeon: Sherren Mocha, MD;  Location: [place];  Service: Open Heart Surgery;  Laterality: Bilateral;    Current Medications: Current Meds  Medication Sig   Artificial Tear Solution (SOOTHE XP) SOLN Place 1 drop into both eyes daily.   aspirin EC 81 MG tablet Take 81 mg by mouth daily. Swallow whole.   atorvastatin (LIPITOR) 20 MG tablet Take 20 mg by mouth at bedtime.   cholecalciferol (VITAMIN D) 1000 UNITS tablet Take 1,000 Units by mouth daily.   lisinopril (ZESTRIL) 20 MG tablet TAKE 1 TABLET BY MOUTH EVERY DAY   pantoprazole (PROTONIX) 40 MG tablet Take 40 mg by mouth daily.     traMADol (ULTRAM) 50 MG tablet Take 1 tablet (50 mg total) by mouth every 6 (six) hours as needed.     Allergies:   Patient has no known allergies.   Social History   Socioeconomic History   Marital status: Widowed    Spouse name: Not on file   Number of children: Not on file   Years of education: Not on file   Highest education level: Not on file  Occupational History   Not on file  Tobacco Use   Smoking status: Never   Smokeless tobacco: Never  Vaping Use   Vaping Use: Never used  Substance and Sexual Activity   Alcohol use: Yes    Comment: OCCASIONALLY wine per pt   Drug use: No   Sexual activity: Not on file  Other Topics Concern   Not on file  Social History Narrative   Not on file   Social Determinants of Health   Financial Resource Strain: Not on file  Food Insecurity: Not on file  Transportation Needs: Not on file  Physical Activity: Not on file  Stress: Not on file  Social Connections: Not on file    Family History: The patient's family history  includes Heart disease in her father. There is no history of Colon cancer.  ROS:   Please see the history of present illness.    All other systems reviewed and are negative.  EKGs/Labs/Other Studies Reviewed:    The following studies were reviewed today:  TAVR OPERATIVE NOTE     Date of Procedure:                09/04/2022   Preoperative Diagnosis:      Severe Aortic Stenosis    Postoperative Diagnosis:    Same    Procedure:        Transcatheter Aortic Valve Replacement - Percutaneous Right Transfemoral Approach             Oletta Lamas  Sapien 3 Ultra THV (size 26 mm, model # 9755RSL, serial # [id number])        Repair of Right Femoral Dissection. (Performed and dictated by Dr Doren Custard)              Co-Surgeons:                        [name] MD and Sherren Mocha, MD       Anesthesiologist:                  Suella Broad MD   Echocardiographer:              Darlina Sicilian Echo tec   Pre-operative Echo Findings: Severe aortic stenosis Preserved  left ventricular systolic function   Post-operative Echo Findings: No paravalvular leak Preserved  left ventricular systolic function _____________   Echo 09/05/22: completed but pending formal read at the time of discharge   EKG:  EKG is ordered today.  The ekg ordered today demonstrates NSR  Recent Labs: 08/31/2022: ALT 14 09/05/2022: BUN 14; Creatinine, Ser 0.94; Hemoglobin 8.9; Magnesium 1.7; Platelets 162; Potassium 3.7; Sodium 141  Recent Lipid Panel    Component Value Date/Time   CHOL  02/11/2011 0349    135        ATP III CLASSIFICATION:  <200     mg/dL   Desirable  200-239  mg/dL   Borderline High  >=240    mg/dL   High          TRIG 66 02/11/2011 0349   HDL 60 02/11/2011 0349   CHOLHDL 2.3 02/11/2011 0349   VLDL 13 02/11/2011 0349   LDLCALC  02/11/2011 0349    62        Total Cholesterol/HDL:CHD Risk Coronary Heart Disease Risk Table                     Men   Women  1/2 Average Risk   3.4   3.3  Average  Risk       5.0   4.4  2 X Average Risk   9.6   7.1  3 X Average Risk  23.4   11.0        Use the calculated Patient Ratio above and the CHD Risk Table to determine the patient's CHD Risk.        ATP III CLASSIFICATION (LDL):  <100     mg/dL   Optimal  100-129  mg/dL   Near or Above                    Optimal  130-159  mg/dL   Borderline  160-189  mg/dL   High  >190     mg/dL   Very High   Physical Exam:    VS:  BP 138/60   Pulse 83   Ht '5\' 7"'$  (1.702 m)   Wt 212 lb 6.4 oz (96.3 kg)   SpO2 96%   BMI 33.27 kg/m     Wt Readings from Last 3 Encounters:  09/12/22 212 lb 6.4 oz (96.3 kg)  09/04/22 209 lb (94.8 kg)  08/27/22 208 lb 11.2 oz (94.7 kg)    General: Well developed, well nourished, NAD Lungs:Clear to ausculation bilaterally. Breathing is unlabored. Cardiovascular: RRR with S1 S2. Soft murmur Extremities: No edema. Right groin site with stable incision, well approximated.  Neuro: Alert and oriented. No focal deficits. No facial asymmetry.  MAE spontaneously. Psych: Responds to questions appropriately with normal affect.   ASSESSMENT/PLAN:    Severe AS: s/p successful TAVR with a 26 mm Edwards Sapien 3 THV via the TF approach on 09/04/22. Post operative echo with stable valve placement with 64m S3UR, mean gradient at 923mg, EOA 3.30cm2, and DI 0.75. TAVR was c/b a right femoral artery dissection and she underwent successful CFA endarterectomy by Dr. DiDoren CustardWound vac was placed with instructions to keep in place for one week until battery dies then can remove. See below. Continue ASA '81mg'$  QD. Wound vac removed off R groin site with stable incision. Lifelong dental SBE discussed and RX'ed with amoxicillin 2g. Plan one month follow up with echocardiogram.   Right femoral artery dissection: TAVR was c/b a right femoral artery dissection and underwent successful CFA endarterectomy by Dr. DiDoren CustardContinue wound vac x 1 week>>removed today with stable incision site. Appears there  is no follow up scheduled with VVS team. Will follow up on this.    Acute blood loss anemia: Hg down to 8.9 during [name] course. Obtain CBC today.   HTN: BP well controlled. No changes made.    HLD: Continue statin.   Hydroureteronephrosis: pre TAVR CT showed mild bilateral hydroureteronephrosis, left greater than right, probably due to the small to moderate hydrocele (? In a woman). Creat has been normal. Will send to urology as an outpatient>>>Maryellen PaClaudia DesanctisMD    Cardiac Rehabilitation Eligibility Assessment: The patient is ready to start Cardiac Rehab from a cardiac standpoint.   Medication Adjustments/Labs and Tests Ordered: Current medicines are reviewed at length with the patient today.  Concerns regarding medicines are outlined above.  No orders of the defined types were placed in this encounter.  No orders of the defined types were placed in this encounter.   Patient Instructions  Medication Instructions:  Start Amoxicillin 500 mg, take 4 tablets by mouth 1 hour before dental procedures and cleanings   *If you need a refill on your cardiac medications before your next appointment, please call your pharmacy*   Lab Work: Cbc- today   If you have labs (blood work) drawn today and your tests are completely normal, you will receive your results only by: [place] you have MyChart) OR A paper copy in the mail If you have any lab test that is abnormal or we need to change your treatment, we will call you to review the results.   Testing/Procedures: None ordered    Follow-Up: Follow up as scheduled   Other Instructions None   Important Information About Sugar         Signed, JiKathyrn DrownNP  09/12/2022 2:10 PM    [facility]

## 2022-09-10 ENCOUNTER — Telehealth: Payer: Self-pay | Admitting: Cardiovascular Disease

## 2022-09-10 ENCOUNTER — Ambulatory Visit: Payer: Medicare HMO

## 2022-09-10 DIAGNOSIS — Z48812 Encounter for surgical aftercare following surgery on the circulatory system: Secondary | ICD-10-CM | POA: Diagnosis not present

## 2022-09-10 DIAGNOSIS — D62 Acute posthemorrhagic anemia: Secondary | ICD-10-CM | POA: Diagnosis not present

## 2022-09-10 DIAGNOSIS — Z952 Presence of prosthetic heart valve: Secondary | ICD-10-CM | POA: Diagnosis not present

## 2022-09-10 DIAGNOSIS — Z7982 Long term (current) use of aspirin: Secondary | ICD-10-CM | POA: Diagnosis not present

## 2022-09-10 DIAGNOSIS — N1330 Unspecified hydronephrosis: Secondary | ICD-10-CM | POA: Diagnosis not present

## 2022-09-10 DIAGNOSIS — Z6832 Body mass index (BMI) 32.0-32.9, adult: Secondary | ICD-10-CM | POA: Diagnosis not present

## 2022-09-10 DIAGNOSIS — E669 Obesity, unspecified: Secondary | ICD-10-CM | POA: Diagnosis not present

## 2022-09-10 DIAGNOSIS — I10 Essential (primary) hypertension: Secondary | ICD-10-CM | POA: Diagnosis not present

## 2022-09-10 DIAGNOSIS — E785 Hyperlipidemia, unspecified: Secondary | ICD-10-CM | POA: Diagnosis not present

## 2022-09-10 NOTE — Telephone Encounter (Signed)
Returned call Remo Lipps to inform him that we do not do home health orders, he should consult PCP Reynaldo Minium. Also asked for clarity on if he wants cardiac rehab orders.

## 2022-09-10 NOTE — Telephone Encounter (Signed)
Remo Lipps from [facility] is calling to get verbal orders for 1 x weekly for 4 weeks and then to cardiac rehab. Requesting call back.

## 2022-09-12 ENCOUNTER — Ambulatory Visit: Payer: Medicare HMO | Attending: Cardiology | Admitting: Cardiology

## 2022-09-12 VITALS — BP 138/60 | HR 83 | Ht 67.0 in | Wt 212.4 lb

## 2022-09-12 DIAGNOSIS — I7777 Dissection of artery of lower extremity: Secondary | ICD-10-CM

## 2022-09-12 DIAGNOSIS — E7800 Pure hypercholesterolemia, unspecified: Secondary | ICD-10-CM | POA: Diagnosis not present

## 2022-09-12 DIAGNOSIS — I10 Essential (primary) hypertension: Secondary | ICD-10-CM | POA: Diagnosis not present

## 2022-09-12 DIAGNOSIS — I350 Nonrheumatic aortic (valve) stenosis: Secondary | ICD-10-CM | POA: Diagnosis not present

## 2022-09-12 DIAGNOSIS — Z952 Presence of prosthetic heart valve: Secondary | ICD-10-CM

## 2022-09-12 DIAGNOSIS — N1339 Other hydronephrosis: Secondary | ICD-10-CM | POA: Diagnosis not present

## 2022-09-12 LAB — CBC
Hematocrit: 27.5 % — ABNORMAL LOW (ref 34.0–46.6)
Hemoglobin: 9 g/dL — ABNORMAL LOW (ref 11.1–15.9)
MCH: 29.6 pg (ref 26.6–33.0)
MCHC: 32.7 g/dL (ref 31.5–35.7)
MCV: 91 fL (ref 79–97)
Platelets: 291 10*3/uL (ref 150–450)
RBC: 3.04 x10E6/uL — ABNORMAL LOW (ref 3.77–5.28)
RDW: 13.6 % (ref 11.7–15.4)
WBC: 7.1 10*3/uL (ref 3.4–10.8)

## 2022-09-12 MED ORDER — AMOXICILLIN 500 MG PO TABS: ORAL_TABLET | ORAL | 6 refills | Status: AC

## 2022-09-12 NOTE — Patient Instructions (Addendum)
Medication Instructions:  Start Amoxicillin 500 mg, take 4 tablets by mouth 1 hour before dental procedures and cleanings   *If you need a refill on your cardiac medications before your next appointment, please call your pharmacy*   Lab Work: Cbc- today   If you have labs (blood work) drawn today and your tests are completely normal, you will receive your results only by: [place] (if you have MyChart) OR A paper copy in the mail If you have any lab test that is abnormal or we need to change your treatment, we will call you to review the results.   Testing/Procedures: Your physician has referred you to see Dr. Jacalyn Lefevre at [facility]. Someone will contact you with an appointment.     Follow-Up: Follow up as scheduled   Other Instructions None   Important Information About Sugar

## 2022-09-14 MED FILL — Magnesium Sulfate Inj 50%: INTRAMUSCULAR | Qty: 10 | Status: AC

## 2022-09-14 MED FILL — Heparin Sodium (Porcine) Inj 1000 Unit/ML: Qty: 1000 | Status: AC

## 2022-09-14 MED FILL — Potassium Chloride Inj 2 mEq/ML: INTRAVENOUS | Qty: 40 | Status: AC

## 2022-09-17 ENCOUNTER — Telehealth: Payer: Self-pay

## 2022-09-17 ENCOUNTER — Telehealth: Payer: Self-pay | Admitting: Cardiovascular Disease

## 2022-09-17 NOTE — Telephone Encounter (Signed)
Pt c/o swelling: STAT is pt has developed SOB within 24 hours  If swelling, where is the swelling located? Right leg   How much weight have you gained and in what time span? Not sure  Have you gained 3 pounds in a day or 5 pounds in a week? no  Do you have a log of your daily weights (if so, list)? no  Are you currently taking a fluid pill? no  Are you currently SOB? no  Have you traveled recently? No  Patient's son says the patient has been having swelling in her right leg since her TAVR.

## 2022-09-17 NOTE — Telephone Encounter (Signed)
Pt's son, Legrand Como, called stating that the pt was having some swelling in her leg post procedure.  Reviewed pt's chart, returned call for clarification, two identifiers used. Pt denies any worsening of symptoms or additional symptoms other than the swelling. The swelling decreases with elevation at night. Reviewed how to properly elevate leg during the day and night. Instructed him to call if anything changes. Appt for post-op f/u scheduled. Confirmed understanding.

## 2022-09-18 ENCOUNTER — Other Ambulatory Visit: Payer: Self-pay | Admitting: Cardiovascular Disease

## 2022-09-18 NOTE — Telephone Encounter (Signed)
Returned call to patient's son who states pt's RLE began to swell 09/19/22. He states that she has an appt with Doren Custard, Vascular surgeon this upcoming Thursday (day after tomorrow). States the swelling is only in R leg, no aching, able to move in all directions, color is intact, no tingling or localized painful areas. Condones that the swelling is most prominently in the foot, and patient states the swelling reaches groin area. Swelling increases throughout day and does seem to recede a bit with rest/elevation. Advised that I would forward this note to structural team as an FYI, but he feels he's getting the help he needs by seeing vascular. Reiterated for him to call us back if anything changes or he feels he wants her to be seen for it.

## 2022-09-19 NOTE — Progress Notes (Unsigned)
  POST OPERATIVE OFFICE NOTE    CC:  F/u for surgery  HPI:  This is a 81 y.o. female who is s/p right CFA endarterectomy on 09/04/22 by Dr. Scot Dock.  S/P t transcatheter aortic valve replacement today.  During closure of the common femoral artery on the right and acute occlusion was noted likely secondary to dissection.   Pt returns today for follow up.  Pt states she has had increased swelling in the right LE that seems to get better after sleep.  She is slowly increasing her ambulation.     Continue medical management with ASA and Lipitor.  No Known Allergies  Current Outpatient Medications  Medication Sig Dispense Refill   lisinopril (ZESTRIL) 20 MG tablet TAKE 1 TABLET BY MOUTH EVERY DAY 90 tablet 3   amoxicillin (AMOXIL) 500 MG tablet Take 4 tablets by mouth 1 hour before dental procedures and cleanings 12 tablet 6   Artificial Tear Solution (SOOTHE XP) SOLN Place 1 drop into both eyes daily.     aspirin EC 81 MG tablet Take 81 mg by mouth daily. Swallow whole.     atorvastatin (LIPITOR) 20 MG tablet Take 20 mg by mouth at bedtime.     cholecalciferol (VITAMIN D) 1000 UNITS tablet Take 1,000 Units by mouth daily.     pantoprazole (PROTONIX) 40 MG tablet Take 40 mg by mouth daily.       traMADol (ULTRAM) 50 MG tablet Take 1 tablet (50 mg total) by mouth every 6 (six) hours as needed. 15 tablet 0   No current facility-administered medications for this visit.     ROS:  See HPI  Physical Exam:    Incision:  right groin is healing well without hematoma or dehiscence Extremities:  Palpable DP pulse right lE Edema in the right LE mid lower leg to ankle Lungs non labored breathing    Assessment/Plan:   Traci [name] is a 81 y.o. female who underwent transcatheter aortic valve replacement today.  During closure of the common femoral artery on the right and acute occlusion was noted likely secondary to dissection.   S/P Right common femoral artery endarterectomy with bovine  pericardial patch angioplasty  by Dr. Scot Dock on 09/04/22.  The groin is well healed without issues and she has good inflow of the right LE with palpable DP pulse.  She will continue to increase activity and elevate her leg to assist with edema. She will f/u in 3 months for ABI's    Traci [name], [facility] [phone]   Clinic MD:  Scot Dock

## 2022-09-20 ENCOUNTER — Ambulatory Visit (INDEPENDENT_AMBULATORY_CARE_PROVIDER_SITE_OTHER): Payer: Medicare HMO | Admitting: Physician Assistant

## 2022-09-20 ENCOUNTER — Other Ambulatory Visit: Payer: Self-pay

## 2022-09-20 VITALS — BP 171/69 | HR 69 | Temp 98.0°F | Resp 20 | Ht 67.0 in | Wt 209.4 lb

## 2022-09-20 DIAGNOSIS — N814 Uterovaginal prolapse, unspecified: Secondary | ICD-10-CM | POA: Insufficient documentation

## 2022-09-20 DIAGNOSIS — I7777 Dissection of artery of lower extremity: Secondary | ICD-10-CM

## 2022-10-09 NOTE — Progress Notes (Signed)
[place]                                     Cardiology Office Note:    Date:  10/10/2022   ID:  Traci [name], DOB Jun 09, 1941, MRN [id number]  PCP:  Burnard Bunting, MD  [facility] Cardiologist:  Sanda Klein, MD / Dr. Burt Knack, MD (TAVR) [facility] Electrophysiologist:  None   Referring MD: Burnard Bunting, MD   Chief Complaint  Patient presents with   Follow-up    1 month s/p TAVR   History of Present Illness:    Traci [name] is a 81 y.o. female with a hx of HTN, HLD and severe AS who presented to [facility] on 09/04/22 for planned TAVR and is being seen today for 1 month follow up with echocardiogram.    Traci [name] is followed by Dr. Sallyanne Kuster for her cardiology care who has been following her mild to moderate aortic stenosis for quite some time. Recent echocardiogram showed progression of her aortic stenosis, gradient is moderate with a mean gradient of 29 mm and calculated aortic valve area of 1.2 cm. However because of progressive symptoms, she was referred for cardiac catheterization which demonstrated a higher transaortic gradient of 37 mmHg mean and no obstructive CAD. She began having progressive symptoms of fatigue and exertional dyspnea with most activities over the last 3-4 months without other explanation.    The patient was evaluated by the multidisciplinary valve team and felt to have severe, symptomatic aortic stenosis and to be a suitable candidate for TAVR.    She is now s/p successful TAVR with a 26 mm Edwards Sapien 3 THV via the TF approach on 09/04/22. Post operative echo showed stable valve placement with 51m S3UR, mean gradient at 946mg, EOA 3.30cm2, and DI 0.75. TAVR was c/b a right femoral artery dissection and she underwent successful CFA endarterectomy by Dr. DiDoren CustardWound vac was placed with instructions to keep in place for one week until battery dies then can remove.    In follow up, she was  doing well with no concerns. Today she continues to do well since she was last seen. Right groin is healed from vascular procedure. She has followed with [facility] and will see them again in Feb 2024. Cardiac rehab has contacted her but she has not yet started this. She was also contacted urology and waiting on this appointment as well. She denies SOB, chest pain, palpitations, orthopnea, dizziness, bleeding or syncope.   Past Medical History:  Diagnosis Date   GERD (gastroesophageal reflux disease)    High cholesterol    Hypertension    Irregular heart rate    S/P TAVR (transcatheter aortic valve replacement) 09/04/2022   s/p TAVR with a 2665mdwards S3UR via the TF approach by Dr. CooBurt KnackDr. WelLavonna Monarch Past Surgical History:  Procedure Laterality Date   APPLICATION OF WOUND VAC Right 09/04/2022   Procedure: APPLICATION OF WOUND VAC;  Surgeon: DicAngelia MouldD;  [facility];  Laterality: Right;   CHOLECYSTECTOMY     DILATION AND CURETTAGE OF UTERUS     ENDARTERECTOMY FEMORAL Right 09/04/2022   Procedure: ENDARTERECTOMY FEMORAL;  Surgeon: DicAngelia MouldD;  [facility];  Laterality: Right;   GROIN DISSECTION Right 09/04/2022   Procedure: GROIN EXPLORATION;  Surgeon: Angelia Mould, MD;  Location: [place];  Service: Vascular;  Laterality: Right;   INTRAOPERATIVE TRANSTHORACIC ECHOCARDIOGRAM N/A 09/04/2022   Procedure: INTRAOPERATIVE TRANSTHORACIC ECHOCARDIOGRAM;  Surgeon: Sherren Mocha, MD;  Location: [place];  Service: Open Heart Surgery;  Laterality: N/A;   PATCH ANGIOPLASTY Right 09/04/2022   Procedure: BOVINE PATCH ANGIOPLASTY;  Surgeon: Angelia Mould, MD;  Location: [facility];  Service: Vascular;  Laterality: Right;   RIGHT/LEFT HEART CATH AND CORONARY ANGIOGRAPHY N/A 06/20/2022   Procedure: RIGHT/LEFT HEART CATH AND CORONARY ANGIOGRAPHY;  Surgeon: Burnell Blanks, MD;  Location: [place] [facility];   Service: Cardiovascular;  Laterality: N/A;   TRANSCATHETER AORTIC VALVE REPLACEMENT, TRANSFEMORAL N/A 09/04/2022   Procedure: Transcatheter Aortic Valve Replacement, Transfemoral;  Surgeon: Sherren Mocha, MD;  Location: [place];  Service: Open Heart Surgery;  Laterality: N/A;   TUBAL LIGATION     ULTRASOUND GUIDANCE FOR VASCULAR ACCESS Bilateral 09/04/2022   Procedure: ULTRASOUND GUIDANCE FOR VASCULAR ACCESS;  Surgeon: Sherren Mocha, MD;  Location: [place];  Service: Open Heart Surgery;  Laterality: Bilateral;    Current Medications: No outpatient medications have been marked as taking for the 10/10/22 encounter (Office Visit) with [facility].     Allergies:   Patient has no known allergies.   Social History   Socioeconomic History   Marital status: Widowed    Spouse name: Not on file   Number of children: Not on file   Years of education: Not on file   Highest education level: Not on file  Occupational History   Not on file  Tobacco Use   Smoking status: Never    Passive exposure: Never   Smokeless tobacco: Never  Vaping Use   Vaping Use: Never used  Substance and Sexual Activity   Alcohol use: Yes    Comment: OCCASIONALLY wine per pt   Drug use: No   Sexual activity: Not on file  Other Topics Concern   Not on file  Social History Narrative   Not on file   Social Determinants of Health   Financial Resource Strain: Not on file  Food Insecurity: Not on file  Transportation Needs: Not on file  Physical Activity: Not on file  Stress: Not on file  Social Connections: Not on file    Family History: The patient's family history includes Heart disease in her father. There is no history of Colon cancer.  ROS:   Please see the history of present illness.    All other systems reviewed and are negative.  EKGs/Labs/Other Studies Reviewed:    The following studies were reviewed today:  Echocardiogram 10/10/22:  1. Cystic liver dx noted on sub  costal images. Left ventricular ejection fraction, by estimation, is 60 to 65%. The left ventricle has normal function. The left ventricle has no regional wall motion abnormalities. There is mild left ventricular hypertrophy. Left ventricular diastolic parameters were normal. The average left ventricular global longitudinal strain is -22.1 %. The global longitudinal strain is normal. 2. Right ventricular systolic function is normal. The right ventricular size is normal. There is normal pulmonary artery systolic pressure. 3. The mitral valve is abnormal. Trivial mitral valve regurgitation. No evidence of mitral stenosis. 4. Post AVR with 26 mm Sapien 3 valve since post implant TTE 10/25 new mild PVL noted at 1:00 on short axis images gradients stable . The aortic valve has been repaired/replaced. Aortic valve regurgitation is not visualized. No aortic stenosis is present. 5. The inferior vena cava  is normal in size with greater than 50% respiratory variability, suggesting right atrial pressure of 3 mmHg.    TAVR OPERATIVE NOTE     Date of Procedure:                09/04/2022   Preoperative Diagnosis:      Severe Aortic Stenosis    Postoperative Diagnosis:    Same    Procedure:        Transcatheter Aortic Valve Replacement - Percutaneous Right Transfemoral Approach             Edwards Sapien 3 Ultra THV (size 26 mm, model # 9755RSL, serial # [id number])        Repair of Right Femoral Dissection. (Performed and dictated by Dr Doren Custard)              Co-Surgeons:                        [name] MD and Sherren Mocha, MD       Anesthesiologist:                  Suella Broad MD   Echocardiographer:              Darlina Sicilian Echo tec   Pre-operative Echo Findings: Severe aortic stenosis Preserved  left ventricular systolic function   Post-operative Echo Findings: No paravalvular leak Preserved  left ventricular systolic function _____________   Echo 09/05/22:   1. 26  mm S3. Vmax 2.3 m/s, MG 9 mmHG, EOA 3.30 cm2, DI 0.75. No  regrugitation or PVL. The aortic valve has been repaired/replaced. Aortic  valve regurgitation is not visualized. There is a 26 mm Sapien prosthetic  (TAVR) valve present in the aortic  position. Procedure Date: 09/04/22. Echo findings are consistent with  normal structure and function of the aortic valve prosthesis.   2. Left ventricular ejection fraction, by estimation, is 70 to 75%. The  left ventricle has hyperdynamic function. The left ventricle has no  regional wall motion abnormalities. Left ventricular diastolic parameters  were normal.   3. Right ventricular systolic function is normal. The right ventricular  size is normal. Tricuspid regurgitation signal is inadequate for assessing  PA pressure.   4. The mitral valve is grossly normal. Mild mitral valve regurgitation.  No evidence of mitral stenosis.   5. The inferior vena cava is normal in size with greater than 50%  respiratory variability, suggesting right atrial pressure of 3 mmHg.   Recent Labs: 08/31/2022: ALT 14 09/05/2022: BUN 14; Creatinine, Ser 0.94; Magnesium 1.7; Potassium 3.7; Sodium 141 09/12/2022: Hemoglobin 9.0; Platelets 291   Recent Lipid Panel    Component Value Date/Time   CHOL  02/11/2011 0349    135        ATP III CLASSIFICATION:  <200     mg/dL   Desirable  200-239  mg/dL   Borderline High  >=240    mg/dL   High          TRIG 66 02/11/2011 0349   HDL 60 02/11/2011 0349   CHOLHDL 2.3 02/11/2011 0349   VLDL 13 02/11/2011 0349   LDLCALC  02/11/2011 0349    62        Total Cholesterol/HDL:CHD Risk Coronary Heart Disease Risk Table                     Men   Women  1/2 Average Risk  3.4   3.3  Average Risk       5.0   4.4  2 X Average Risk   9.6   7.1  3 X Average Risk  23.4   11.0        Use the calculated Patient Ratio above and the CHD Risk Table to determine the patient's CHD Risk.        ATP III CLASSIFICATION (LDL):  <100      mg/dL   Optimal  100-129  mg/dL   Near or Above                    Optimal  130-159  mg/dL   Borderline  160-189  mg/dL   High  >190     mg/dL   Very High   Physical Exam:    VS:  BP (!) 142/68 (BP Location: Left Arm)   Pulse 67   Resp 20   Wt 207 lb 0.6 oz (93.9 kg)   SpO2 98%   BMI 32.43 kg/m     Wt Readings from Last 3 Encounters:  10/10/22 207 lb 0.6 oz (93.9 kg)  09/20/22 209 lb 6.4 oz (95 kg)  09/12/22 212 lb 6.4 oz (96.3 kg)    General: Well developed, well nourished, NAD Lungs:Clear to ausculation bilaterally. No wheezes Cardiovascular: RRR with S1 S2. No murmurs Extremities: No edema.  Neuro: Alert and oriented. No focal deficits. No facial asymmetry. MAE spontaneously. Psych: Responds to questions appropriately with normal affect.    ASSESSMENT/PLAN:    Severe AS: Patient doing well with NYHA class I symptoms s/p successful TAVR with a 26 mm Edwards Sapien 3 THV via the TF approach on 09/04/22. Post operative echo with stable valve placement with 79m S3UR, mean gradient at 964mg, EOA 3.30cm2, and DI 0.75. Echo today with stable valve function with mild PVL noted at 1:00, mean gradient at 11.3 mmHg, peak gradient at 20.78m37m and AVA by VTI 1.45 cm. Continue ASA '81mg'$  QD. Lifelong dental SBE discussed and RX'ed with amoxicillin 2g. Plan one year follow up with echocardiogram.    Right femoral artery dissection: TAVR was c/b a right femoral artery dissection and underwent successful CFA endarterectomy by Dr. DixDoren Custardoing well with no complaints today. Follows with [facility].     HTN: Stable with no changes today.    HLD: Continue statin.   Hydroureteronephrosis: Referred to urology, Dr. PacClaudia DesanctisD.   Medication Adjustments/Labs and Tests Ordered: Current medicines are reviewed at length with the patient today.  Concerns regarding medicines are outlined above.  No orders of the defined types were placed in this encounter.  No orders of the defined types were placed in  this encounter.   Patient Instructions  Medication Instructions:  Your physician recommends that you continue on your current medications as directed. Please refer to the Current Medication list given to you today.  *If you need a refill on your cardiac medications before your next appointment, please call your pharmacy*  Lab Work: None ordered.  If you have labs (blood work) drawn today and your tests are completely normal, you will receive your results only by: [place]f you have MyChart) OR A paper copy in the mail If you have any lab test that is abnormal or we need to change your treatment, we will call you to review the results.  Testing/Procedures: None ordered.  Follow-Up: [facility] and your health needs are our priority.  As part of  our continuing mission to provide you with exceptional heart care, we have created designated Provider Care Teams.  These Care Teams include your primary Cardiologist (physician) and Advanced Practice Providers (APPs -  Physician Assistants and Nurse Practitioners) who all work together to provide you with the care you need, when you need it.  We recommend signing up for the patient portal called "MyChart".  Sign up information is provided on this After Visit Summary.  MyChart is used to connect with patients for Virtual Visits (Telemedicine).  Patients are able to view lab/test results, encounter notes, upcoming appointments, etc.  Non-urgent messages can be sent to your provider as well.   To learn more about what you can do with MyChart, go to [email or web address].    Your next appointment:  1 Year  The format for your next appointment:   In Person  Provider:   Kathyrn Drown, NP    Important Information About Sugar         Signed, Kathyrn Drown, NP  10/10/2022 10:01 AM    [place]

## 2022-10-10 ENCOUNTER — Ambulatory Visit (HOSPITAL_BASED_OUTPATIENT_CLINIC_OR_DEPARTMENT_OTHER): Payer: Medicare HMO

## 2022-10-10 ENCOUNTER — Ambulatory Visit (HOSPITAL_COMMUNITY): Payer: Medicare HMO | Attending: Cardiology | Admitting: Cardiology

## 2022-10-10 ENCOUNTER — Other Ambulatory Visit: Payer: Self-pay | Admitting: Cardiology

## 2022-10-10 VITALS — BP 142/68 | HR 67 | Resp 20 | Wt 207.0 lb

## 2022-10-10 DIAGNOSIS — E7800 Pure hypercholesterolemia, unspecified: Secondary | ICD-10-CM | POA: Diagnosis not present

## 2022-10-10 DIAGNOSIS — N1339 Other hydronephrosis: Secondary | ICD-10-CM

## 2022-10-10 DIAGNOSIS — Z952 Presence of prosthetic heart valve: Secondary | ICD-10-CM

## 2022-10-10 DIAGNOSIS — I10 Essential (primary) hypertension: Secondary | ICD-10-CM | POA: Insufficient documentation

## 2022-10-10 DIAGNOSIS — I350 Nonrheumatic aortic (valve) stenosis: Secondary | ICD-10-CM | POA: Diagnosis not present

## 2022-10-10 DIAGNOSIS — I7777 Dissection of artery of lower extremity: Secondary | ICD-10-CM | POA: Diagnosis not present

## 2022-10-10 LAB — ECHOCARDIOGRAM COMPLETE
AR max vel: 1.31 cm2
AV Area VTI: 1.45 cm2
AV Area mean vel: 1.37 cm2
AV Mean grad: 11.3 mmHg
AV Peak grad: 20.6 mmHg
Ao pk vel: 2.27 m/s
Area-P 1/2: 3.26 cm2
P 1/2 time: 348 msec
S' Lateral: 2 cm

## 2022-10-10 NOTE — Patient Instructions (Addendum)
Medication Instructions:  Your physician recommends that you continue on your current medications as directed. Please refer to the Current Medication list given to you today.  *If you need a refill on your cardiac medications before your next appointment, please call your pharmacy*  Lab Work: None ordered.  If you have labs (blood work) drawn today and your tests are completely normal, you will receive your results only by: [place] (if you have MyChart) OR A paper copy in the mail If you have any lab test that is abnormal or we need to change your treatment, we will call you to review the results.  Testing/Procedures: None ordered.  Follow-Up: At [facility], you and your health needs are our priority.  As part of our continuing mission to provide you with exceptional heart care, we have created designated Provider Care Teams.  These Care Teams include your primary Cardiologist (physician) and Advanced Practice Providers (APPs -  Physician Assistants and Nurse Practitioners) who all work together to provide you with the care you need, when you need it.  We recommend signing up for the patient portal called "MyChart".  Sign up information is provided on this After Visit Summary.  MyChart is used to connect with patients for Virtual Visits (Telemedicine).  Patients are able to view lab/test results, encounter notes, upcoming appointments, etc.  Non-urgent messages can be sent to your provider as well.   To learn more about what you can do with MyChart, go to [email or web address].    Your next appointment:  1 Year  The format for your next appointment:   In Person  Provider:   Kathyrn Drown, NP    Important Information About Sugar

## 2022-10-15 ENCOUNTER — Telehealth (HOSPITAL_COMMUNITY): Payer: Self-pay

## 2022-10-15 NOTE — Telephone Encounter (Signed)
RN attempted to contact patient regarding her Cardiac Rehab nursing pre-assessment. LVM to return call.

## 2022-10-15 NOTE — Telephone Encounter (Signed)
Called patient to see if she was interested in participating in the Cardiac Rehab Program. Patient stated yes. Patient will come in for orientation on 10/16/22 @ 10:30AM and will attend the 1:45PM exercise class.

## 2022-10-16 ENCOUNTER — Encounter (HOSPITAL_COMMUNITY)
Admission: RE | Admit: 2022-10-16 | Discharge: 2022-10-16 | Disposition: A | Discharge status: Home / Self Care | Payer: Medicare HMO | Source: Ambulatory Visit | Attending: Cardiovascular Disease | Admitting: Cardiovascular Disease

## 2022-10-16 ENCOUNTER — Telehealth (HOSPITAL_COMMUNITY): Payer: Self-pay

## 2022-10-16 ENCOUNTER — Encounter (HOSPITAL_COMMUNITY): Payer: Self-pay

## 2022-10-16 VITALS — BP 142/84 | HR 82 | Ht 67.0 in | Wt 210.8 lb

## 2022-10-16 DIAGNOSIS — Z48812 Encounter for surgical aftercare following surgery on the circulatory system: Secondary | ICD-10-CM | POA: Insufficient documentation

## 2022-10-16 DIAGNOSIS — Z952 Presence of prosthetic heart valve: Secondary | ICD-10-CM | POA: Diagnosis not present

## 2022-10-16 NOTE — Progress Notes (Signed)
Cardiac Individual Treatment Plan  Patient Details  Name: Traci [name] MRN: [id number] Date of Birth: Jan 15, 1941 Referring Provider:   Flowsheet Row INTENSIVE CARDIAC REHAB ORIENT from 10/16/2022 [facility] [place]  Referring Provider Croitoru, Dani Gobble, MD       Initial Encounter Date:  [place] from 10/16/2022 in [facility]  Date 10/16/22       Visit Diagnosis: 09/04/22 TAVR (transcatheter aortic valve replacement)  Patient's Home Medications on Admission:  Current Outpatient Medications:    amoxicillin (AMOXIL) 500 MG tablet, Take 4 tablets by mouth 1 hour before dental procedures and cleanings, Disp: 12 tablet, Rfl: 6   Artificial Tear Solution (SOOTHE XP) SOLN, Place 1 drop into both eyes daily., Disp: , Rfl:    aspirin EC 81 MG tablet, Take 81 mg by mouth daily. Swallow whole., Disp: , Rfl:    atorvastatin (LIPITOR) 20 MG tablet, Take 20 mg by mouth at bedtime., Disp: , Rfl:    cholecalciferol (VITAMIN D) 1000 UNITS tablet, Take 1,000 Units by mouth daily., Disp: , Rfl:    lisinopril (ZESTRIL) 20 MG tablet, TAKE 1 TABLET BY MOUTH EVERY DAY, Disp: 90 tablet, Rfl: 3   pantoprazole (PROTONIX) 40 MG tablet, Take 40 mg by mouth daily.  , Disp: , Rfl:    traMADol (ULTRAM) 50 MG tablet, Take 1 tablet (50 mg total) by mouth every 6 (six) hours as needed. (Patient not taking: Reported on 10/16/2022), Disp: 15 tablet, Rfl: 0  Past Medical History: Past Medical History:  Diagnosis Date   GERD (gastroesophageal reflux disease)    High cholesterol    Hypertension    Irregular heart rate    S/P TAVR (transcatheter aortic valve replacement) 09/04/2022   s/p TAVR with a 74m Edwards S3UR via the TF approach by Dr. [name]& Dr. [name]   Tobacco Use: Social History   Tobacco Use  Smoking Status Never   Passive exposure: Never   Smokeless Tobacco Never    Labs: Review Flowsheet       Latest Ref Rng & Units 02/10/2011 02/11/2011 06/20/2022 09/04/2022  Labs for ITP Cardiac and Pulmonary Rehab  Cholestrol 0 - 200 mg/dL 151        ATP III CLASSIFICATION:  <200     mg/dL   Desirable  200-239  mg/dL   Borderline High  >=240    mg/dL   High         135        ATP III CLASSIFICATION:  <200     mg/dL   Desirable  200-239  mg/dL   Borderline High  >=240    mg/dL   High         - -  LDL (calc) 0 - 99 mg/dL 71        Total Cholesterol/HDL:CHD Risk Coronary Heart Disease Risk Table                     Men   Women  1/2 Average Risk   3.4   3.3  Average Risk       5.0   4.4  2 X Average Risk   9.6   7.1  3 X Average Risk  23.4   11.0        Use the calculated Patient Ratio above and the CHD Risk Table to determine the  patient's CHD Risk.        ATP III CLASSIFICATION (LDL):  <100     mg/dL   Optimal  100-129  mg/dL   Near or Above                    Optimal  130-159  mg/dL   Borderline  160-189  mg/dL   High  >190     mg/dL   Very High  62        Total Cholesterol/HDL:CHD Risk Coronary Heart Disease Risk Table                     Men   Women  1/2 Average Risk   3.4   3.3  Average Risk       5.0   4.4  2 X Average Risk   9.6   7.1  3 X Average Risk  23.4   11.0        Use the calculated Patient Ratio above and the CHD Risk Table to determine the patient's CHD Risk.        ATP III CLASSIFICATION (LDL):  <100     mg/dL   Optimal  100-129  mg/dL   Near or Above                    Optimal  130-159  mg/dL   Borderline  160-189  mg/dL   High  >190     mg/dL   Very High  - -  HDL-C >39 mg/dL 73  60  - -  Trlycerides <150 mg/dL 35  66  - -  Hemoglobin A1c <5.7 % 5.6 (NOTE)                                                                       According to the ADA Clinical Practice Recommendations for 2011, when HbA1c is used as a screening test:   >=6.5%   Diagnostic of Diabetes Mellitus           (if  abnormal result  is confirmed)  5.7-6.4%   Increased risk of developing Diabetes Mellitus  References:Diagnosis and Classification of Diabetes Mellitus,Diabetes TGGY,6948,54(OEVOJ 1):S62-S69 and Standards of Medical Care in         Diabetes - 2011,Diabetes Care,2011,34  (Suppl 1):S11-S61.  - - -  PH, Arterial 7.35 - 7.45 - - 7.369  -  PCO2 arterial 32 - 48 mmHg - - 45.7  -  Bicarbonate 20.0 - 28.0 mmol/L - - 26.3  24.4  -  TCO2 22 - 32 mmol/L - - _0 Acid-base deficit 0.0 - 2.0 mmol/L - - 1.0  -  O2 Saturation % - - 91  72  -    Capillary Blood Glucose: No results found for: "GLUCAP"   Exercise Target Goals: Exercise Program Goal: Individual exercise prescription set using results from initial 6 min walk test and THRR while considering  patient's activity barriers and safety.   Exercise Prescription Goal: Initial exercise prescription builds to 30-45 minutes a day of aerobic activity, 2-3 days per week.  Home exercise guidelines will be given to patient during program as part of  exercise prescription that the participant will acknowledge.  Activity Barriers & Risk Stratification:  Activity Barriers & Cardiac Risk Stratification - 10/16/22 1144       Activity Barriers & Cardiac Risk Stratification   Activity Barriers Back Problems;Other (comment)    Comments Occassional back stiffness.    Cardiac Risk Stratification High             6 Minute Walk:  6 Minute Walk     Row Name 10/16/22 1134         6 Minute Walk   Phase Initial     Distance 1034 feet     Walk Time 6 minutes     # of Rest Breaks 1  One 24 second standing rest break taken.     MPH 1.96     METS 1.96     RPE 12     Perceived Dyspnea  1     VO2 Peak 6.87     Symptoms Yes (comment)     Comments Mild shortness of breath.     Resting HR 82 bpm     Resting BP 142/84     Resting Oxygen Saturation  96 %     Exercise Oxygen Saturation  during 6 min walk 98 %     Max Ex. HR 118 bpm     Max  Ex. BP 168/64     2 Minute Post BP 148/68              Oxygen Initial Assessment:   Oxygen Re-Evaluation:   Oxygen Discharge (Final Oxygen Re-Evaluation):   Initial Exercise Prescription:  Initial Exercise Prescription - 10/16/22 1200       Date of Initial Exercise RX and Referring Provider   Date 10/16/22    Referring Provider Croitoru, Dani Gobble, MD    Expected Discharge Date 12/14/22      T5 Nustep   Level 1    SPM 75    Minutes 30    METs 1.5      Prescription Details   Frequency (times per week) 3    Duration Progress to 30 minutes of continuous aerobic without signs/symptoms of physical distress      Intensity   THRR 40-80% of Max Heartrate 55-110    Ratings of Perceived Exertion 11-13    Perceived Dyspnea 0-4      Progression   Progression Continue to progress workloads to maintain intensity without signs/symptoms of physical distress.      Resistance Training   Training Prescription Yes    Weight 1 lb    Reps 10-15             Perform Capillary Blood Glucose checks as needed.  Exercise Prescription Changes:   Exercise Comments:   Exercise Goals and Review:   Exercise Goals     Row Name 10/16/22 1144             Exercise Goals   Increase Physical Activity Yes       Intervention Provide advice, education, support and counseling about physical activity/exercise needs.;Develop an individualized exercise prescription for aerobic and resistive training based on initial evaluation findings, risk stratification, comorbidities and participant's personal goals.       Expected Outcomes Short Term: Attend rehab on a regular basis to increase amount of physical activity.;Long Term: Exercising regularly at least 3-5 days a week.;Long Term: Add in home exercise to make exercise part of routine and to increase amount of physical activity.  Increase Strength and Stamina Yes       Intervention Provide advice, education, support and counseling  about physical activity/exercise needs.;Develop an individualized exercise prescription for aerobic and resistive training based on initial evaluation findings, risk stratification, comorbidities and participant's personal goals.       Expected Outcomes Short Term: Increase workloads from initial exercise prescription for resistance, speed, and METs.;Short Term: Perform resistance training exercises routinely during rehab and add in resistance training at home;Long Term: Improve cardiorespiratory fitness, muscular endurance and strength as measured by increased METs and functional capacity (6MWT)       Able to understand and use rate of perceived exertion (RPE) scale Yes       Intervention Provide education and explanation on how to use RPE scale       Expected Outcomes Short Term: Able to use RPE daily in rehab to express subjective intensity level;Long Term:  Able to use RPE to guide intensity level when exercising independently       Knowledge and understanding of Target Heart Rate Range (THRR) Yes       Intervention Provide education and explanation of THRR including how the numbers were predicted and where they are located for reference       Expected Outcomes Short Term: Able to state/look up THRR;Long Term: Able to use THRR to govern intensity when exercising independently;Short Term: Able to use daily as guideline for intensity in rehab       Able to check pulse independently Yes       Intervention Provide education and demonstration on how to check pulse in carotid and radial arteries.;Review the importance of being able to check your own pulse for safety during independent exercise       Expected Outcomes Short Term: Able to explain why pulse checking is important during independent exercise;Long Term: Able to check pulse independently and accurately       Understanding of Exercise Prescription Yes       Intervention Provide education, explanation, and written materials on patient's individual  exercise prescription       Expected Outcomes Short Term: Able to explain program exercise prescription;Long Term: Able to explain home exercise prescription to exercise independently                Exercise Goals Re-Evaluation :   Discharge Exercise Prescription (Final Exercise Prescription Changes):   Nutrition:  Target Goals: Understanding of nutrition guidelines, daily intake of sodium '1500mg'$ , cholesterol '200mg'$ , calories 30% from fat and 7% or less from saturated fats, daily to have 5 or more servings of fruits and vegetables.  Biometrics:  Pre Biometrics - 10/16/22 1015       Pre Biometrics   Waist Circumference 43.5 inches    Hip Circumference 49.75 inches    Waist to Hip Ratio 0.87 %    Triceps Skinfold 34 mm    % Body Fat 46.4 %    Grip Strength 14 kg    Flexibility --   Not performed due to back stiffness.   Single Leg Stand 7.81 seconds              Nutrition Therapy Plan and Nutrition Goals:   Nutrition Assessments:  MEDIFICTS Score Key: ?70 Need to make dietary changes  40-70 Heart Healthy Diet ? 40 Therapeutic Level Cholesterol Diet    Picture Your Plate Scores: <16 Unhealthy dietary pattern with much room for improvement. 41-50 Dietary pattern unlikely to meet recommendations for good health and room for improvement.  51-60 More healthful dietary pattern, with some room for improvement.  >60 Healthy dietary pattern, although there may be some specific behaviors that could be improved.    Nutrition Goals Re-Evaluation:   Nutrition Goals Re-Evaluation:   Nutrition Goals Discharge (Final Nutrition Goals Re-Evaluation):   Psychosocial: Target Goals: Acknowledge presence or absence of significant depression and/or stress, maximize coping skills, provide positive support system. Participant is able to verbalize types and ability to use techniques and skills needed for reducing stress and depression.  Initial Review & Psychosocial  Screening:  Initial Psych Review & Screening - 10/16/22 1139       Initial Review   Current issues with None Identified      Family Dynamics   Good Support System? Yes   Finck lives alone she has her son whom lives nearby. Sarah's other son lives in [place] and [place]     Barriers   Psychosocial barriers to participate in program There are no identifiable barriers or psychosocial needs.      Screening Interventions   Interventions Encouraged to exercise             Quality of Life Scores:  Quality of Life - 10/16/22 1356       Quality of Life   Select Quality of Life      Quality of Life Scores   Health/Function Pre 22.71 %    Socioeconomic Pre 26.5 %    Psych/Spiritual Pre 25.64 %    Family Pre 27 %    GLOBAL Pre 24.6 %            Scores of 19 and below usually indicate a poorer quality of life in these areas.  A difference of  2-3 points is a clinically meaningful difference.  A difference of 2-3 points in the total score of the Quality of Life Index has been associated with significant improvement in overall quality of life, self-image, physical symptoms, and general health in studies assessing change in quality of life.  PHQ-9: Review Flowsheet       10/16/2022  Depression screen PHQ 2/9  Decreased Interest 0  Down, Depressed, Hopeless 0  PHQ - 2 Score 0   Interpretation of Total Score  Total Score Depression Severity:  1-4 = Minimal depression, 5-9 = Mild depression, 10-14 = Moderate depression, 15-19 = Moderately severe depression, 20-27 = Severe depression   Psychosocial Evaluation and Intervention:   Psychosocial Re-Evaluation:   Psychosocial Discharge (Final Psychosocial Re-Evaluation):   Vocational Rehabilitation: Provide vocational rehab assistance to qualifying candidates.   Vocational Rehab Evaluation & Intervention:  Vocational Rehab - 10/16/22 1138       Initial Vocational Rehab Evaluation & Intervention   Assessment  shows need for Vocational Rehabilitation No   Kleine is retired and does not need vocational rehab at this time            Education: Education Goals: Education classes will be provided on a weekly basis, covering required topics. Participant will state understanding/return demonstration of topics presented.     Core Videos: Exercise    Move It!  Clinical staff conducted group or individual video education with verbal and written material and guidebook.  Patient learns the recommended Pritikin exercise program. Exercise with the goal of living a long, healthy life. Some of the health benefits of exercise include controlled diabetes, healthier blood pressure levels, improved cholesterol levels, improved heart and lung capacity, improved sleep, and better body composition. Everyone should speak with their doctor before  starting or changing an exercise routine.  Biomechanical Limitations Clinical staff conducted group or individual video education with verbal and written material and guidebook.  Patient learns how biomechanical limitations can impact exercise and how we can mitigate and possibly overcome limitations to have an impactful and balanced exercise routine.  Body Composition Clinical staff conducted group or individual video education with verbal and written material and guidebook.  Patient learns that body composition (ratio of muscle mass to fat mass) is a key component to assessing overall fitness, rather than body weight alone. Increased fat mass, especially visceral belly fat, can put [place] at increased risk for metabolic syndrome, type 2 diabetes, heart disease, and even death. It is recommended to combine diet and exercise (cardiovascular and resistance training) to improve your body composition. Seek guidance from your physician and exercise physiologist before implementing an exercise routine.  Exercise Action Plan Clinical staff conducted group or individual video education  with verbal and written material and guidebook.  Patient learns the recommended strategies to achieve and enjoy long-term exercise adherence, including variety, self-motivation, self-efficacy, and positive decision making. Benefits of exercise include fitness, good health, weight management, more energy, better sleep, less stress, and overall well-being.  Medical   Heart Disease Risk Reduction Clinical staff conducted group or individual video education with verbal and written material and guidebook.  Patient learns our heart is our most vital organ as it circulates oxygen, nutrients, white blood cells, and hormones throughout the entire body, and carries waste away. Data supports a plant-based eating plan like the Pritikin Program for its effectiveness in slowing progression of and reversing heart disease. The video provides a number of recommendations to address heart disease.   Metabolic Syndrome and Belly Fat  Clinical staff conducted group or individual video education with verbal and written material and guidebook.  Patient learns what metabolic syndrome is, how it leads to heart disease, and how one can reverse it and keep it from coming back. You have metabolic syndrome if you have 3 of the following 5 criteria: abdominal obesity, high blood pressure, high triglycerides, low HDL cholesterol, and high blood sugar.  Hypertension and Heart Disease Clinical staff conducted group or individual video education with verbal and written material and guidebook.  Patient learns that high blood pressure, or hypertension, is very common in the [place]. Hypertension is largely due to excessive salt intake, but other important risk factors include being overweight, physical inactivity, drinking too much alcohol, smoking, and not eating enough potassium from fruits and vegetables. High blood pressure is a leading risk factor for heart attack, stroke, congestive heart failure, dementia, kidney failure,  and premature death. Long-term effects of excessive salt intake include stiffening of the arteries and thickening of heart muscle and organ damage. Recommendations include ways to reduce hypertension and the risk of heart disease.  Diseases of Our Time - Focusing on Diabetes Clinical staff conducted group or individual video education with verbal and written material and guidebook.  Patient learns why the best way to stop diseases of our time is prevention, through food and other lifestyle changes. Medicine (such as prescription pills and surgeries) is often only a Band-Aid on the problem, not a long-term solution. Most common diseases of our time include obesity, type 2 diabetes, hypertension, heart disease, and cancer. The Pritikin Program is recommended and has been proven to help reduce, reverse, and/or prevent the damaging effects of metabolic syndrome.  Nutrition   Overview of the [place]  Clinical staff  conducted group or individual video education with verbal and written material and guidebook.  Patient learns about the [place] for disease risk reduction. The [place] emphasizes a wide variety of unrefined, minimally-processed carbohydrates, like fruits, vegetables, whole grains, and legumes. Go, Caution, and Stop food choices are explained. Plant-based and lean animal proteins are emphasized. Rationale provided for low sodium intake for blood pressure control, low added sugars for blood sugar stabilization, and low added fats and oils for coronary artery disease risk reduction and weight management.  Calorie Density  Clinical staff conducted group or individual video education with verbal and written material and guidebook.  Patient learns about calorie density and how it impacts the Pritikin Eating Plan. Knowing the characteristics of the food you choose will help you decide whether those foods will lead to weight gain or weight loss, and whether you want  to consume more or less of them. Weight loss is usually a side effect of the Pritikin Eating Plan because of its focus on low calorie-dense foods.  Label Reading  Clinical staff conducted group or individual video education with verbal and written material and guidebook.  Patient learns about the Pritikin recommended label reading guidelines and corresponding recommendations regarding calorie density, added sugars, sodium content, and whole grains.  Dining Out - Part 1  Clinical staff conducted group or individual video education with verbal and written material and guidebook.  Patient learns that restaurant meals can be sabotaging because they can be so high in calories, fat, sodium, and/or sugar. Patient learns recommended strategies on how to positively address this and avoid unhealthy pitfalls.  Facts on Fats  Clinical staff conducted group or individual video education with verbal and written material and guidebook.  Patient learns that lifestyle modifications can be just as effective, if not more so, as many medications for lowering your risk of heart disease. A Pritikin lifestyle can help to reduce your risk of inflammation and atherosclerosis (cholesterol build-up, or plaque, in the artery walls). Lifestyle interventions such as dietary choices and physical activity address the cause of atherosclerosis. A review of the types of fats and their impact on blood cholesterol levels, along with dietary recommendations to reduce fat intake is also included.  Nutrition Action Plan  Clinical staff conducted group or individual video education with verbal and written material and guidebook.  Patient learns how to incorporate Pritikin recommendations into their lifestyle. Recommendations include planning and keeping personal health goals in mind as an important part of their success.  Healthy Mind-Set    Healthy Minds, Bodies, Hearts  Clinical staff conducted group or individual video education with  verbal and written material and guidebook.  Patient learns how to identify when they are stressed. Video will discuss the impact of that stress, as well as the many benefits of stress management. Patient will also be introduced to stress management techniques. The way we think, act, and feel has an impact on our hearts.  How Our Thoughts Can Heal Our Hearts  Clinical staff conducted group or individual video education with verbal and written material and guidebook.  Patient learns that negative thoughts can cause depression and anxiety. This can result in negative lifestyle behavior and serious health problems. Cognitive behavioral therapy is an effective method to help control our thoughts in order to change and improve our emotional outlook.  Additional Videos:  Exercise    Improving Performance  Clinical staff conducted group or individual video education with verbal and written material and guidebook.  Patient learns to use a non-linear approach by alternating intensity levels and lengths of time spent exercising to help burn more calories and lose more body fat. Cardiovascular exercise helps improve heart health, metabolism, hormonal balance, blood sugar control, and recovery from fatigue. Resistance training improves strength, endurance, balance, coordination, reaction time, metabolism, and muscle mass. Flexibility exercise improves circulation, posture, and balance. Seek guidance from your physician and exercise physiologist before implementing an exercise routine and learn your capabilities and proper form for all exercise.  Introduction to Yoga  Clinical staff conducted group or individual video education with verbal and written material and guidebook.  Patient learns about yoga, a discipline of the coming together of mind, breath, and body. The benefits of yoga include improved flexibility, improved range of motion, better posture and core strength, increased lung function, weight loss, and  positive self-image. Yoga's heart health benefits include lowered blood pressure, healthier heart rate, decreased cholesterol and triglyceride levels, improved immune function, and reduced stress. Seek guidance from your physician and exercise physiologist before implementing an exercise routine and learn your capabilities and proper form for all exercise.  Medical   Aging: Enhancing Your Quality of Life  Clinical staff conducted group or individual video education with verbal and written material and guidebook.  Patient learns key strategies and recommendations to stay in good physical health and enhance quality of life, such as prevention strategies, having an advocate, securing a [place], and keeping a list of medications and system for tracking them. It also discusses how to avoid risk for bone loss.  Biology of Weight Control  Clinical staff conducted group or individual video education with verbal and written material and guidebook.  Patient learns that weight gain occurs because we consume more calories than we burn (eating more, moving less). Even if your body weight is normal, you may have higher ratios of fat compared to muscle mass. Too much body fat puts you at increased risk for cardiovascular disease, heart attack, stroke, type 2 diabetes, and obesity-related cancers. In addition to exercise, following the [place] can help reduce your risk.  Decoding Lab Results  Clinical staff conducted group or individual video education with verbal and written material and guidebook.  Patient learns that lab test reflects one measurement whose values change over time and are influenced by many factors, including medication, stress, sleep, exercise, food, hydration, pre-existing medical conditions, and more. It is recommended to use the knowledge from this video to become more involved with your lab results and evaluate your numbers to speak with your  doctor.   Diseases of Our Time - Overview  Clinical staff conducted group or individual video education with verbal and written material and guidebook.  Patient learns that according to the CDC, 50% to 70% of chronic diseases (such as obesity, type 2 diabetes, elevated lipids, hypertension, and heart disease) are avoidable through lifestyle improvements including healthier food choices, listening to satiety cues, and increased physical activity.  Sleep Disorders Clinical staff conducted group or individual video education with verbal and written material and guidebook.  Patient learns how good quality and duration of sleep are important to overall health and well-being. Patient also learns about sleep disorders and how they impact health along with recommendations to address them, including discussing with a physician.  Nutrition  Dining Out - Part 2 Clinical staff conducted group or individual video education with verbal and written material and guidebook.  Patient learns how to plan ahead  and communicate in order to maximize their dining experience in a healthy and nutritious manner. Included are recommended food choices based on the type of restaurant the patient is visiting.   Fueling a Best boy conducted group or individual video education with verbal and written material and guidebook.  There is a strong connection between our food choices and our health. Diseases like obesity and type 2 diabetes are very prevalent and are in large-part due to lifestyle choices. The Pritikin Eating Plan provides plenty of food and hunger-curbing satisfaction. It is easy to follow, affordable, and helps reduce health risks.  Menu Workshop  Clinical staff conducted group or individual video education with verbal and written material and guidebook.  Patient learns that restaurant meals can sabotage health goals because they are often packed with calories, fat, sodium, and sugar.  Recommendations include strategies to plan ahead and to communicate with the manager, chef, or server to help order a healthier meal.  Planning Your Eating Strategy  Clinical staff conducted group or individual video education with verbal and written material and guidebook.  Patient learns about the [place] and its benefit of reducing the risk of disease. The [place] does not focus on calories. Instead, it emphasizes high-quality, nutrient-rich foods. By knowing the characteristics of the foods, we choose, we can determine their calorie density and make informed decisions.  Targeting Your Nutrition Priorities  Clinical staff conducted group or individual video education with verbal and written material and guidebook.  Patient learns that lifestyle habits have a tremendous impact on disease risk and progression. This video provides eating and physical activity recommendations based on your personal health goals, such as reducing LDL cholesterol, losing weight, preventing or controlling type 2 diabetes, and reducing high blood pressure.  Vitamins and Minerals  Clinical staff conducted group or individual video education with verbal and written material and guidebook.  Patient learns different ways to obtain key vitamins and minerals, including through a recommended healthy diet. It is important to discuss all supplements you take with your doctor.   Healthy Mind-Set    Smoking Cessation  Clinical staff conducted group or individual video education with verbal and written material and guidebook.  Patient learns that cigarette smoking and tobacco addiction pose a serious health risk which affects millions of people. Stopping smoking will significantly reduce the risk of heart disease, lung disease, and many forms of cancer. Recommended strategies for quitting are covered, including working with your doctor to develop a successful plan.  Culinary   Becoming a Hydrologist conducted group or individual video education with verbal and written material and guidebook.  Patient learns that cooking at home can be healthy, cost-effective, quick, and puts them in control. Keys to cooking healthy recipes will include looking at your recipe, assessing your equipment needs, planning ahead, making it simple, choosing cost-effective seasonal ingredients, and limiting the use of added fats, salts, and sugars.  Cooking - Breakfast and Snacks  Clinical staff conducted group or individual video education with verbal and written material and guidebook.  Patient learns how important breakfast is to satiety and nutrition through the entire day. Recommendations include key foods to eat during breakfast to help stabilize blood sugar levels and to prevent overeating at meals later in the day. Planning ahead is also a key component.  Cooking - Human resources officer conducted group or individual video education with verbal and written material and guidebook.  Patient learns eating strategies to improve overall health, including an approach to cook more at home. Recommendations include thinking of animal protein as a side on your plate rather than center stage and focusing instead on lower calorie dense options like vegetables, fruits, whole grains, and plant-based proteins, such as beans. Making sauces in large quantities to freeze for later and leaving the skin on your vegetables are also recommended to maximize your experience.  Cooking - Healthy Salads and Dressing Clinical staff conducted group or individual video education with verbal and written material and guidebook.  Patient learns that vegetables, fruits, whole grains, and legumes are the foundations of the [place]. Recommendations include how to incorporate each of these in flavorful and healthy salads, and how to create homemade salad dressings. Proper handling of ingredients is also covered.  Cooking - Soups and [employer] - Soups and Desserts Clinical staff conducted group or individual video education with verbal and written material and guidebook.  Patient learns that Pritikin soups and desserts make for easy, nutritious, and delicious snacks and meal components that are low in sodium, fat, sugar, and calorie density, while high in vitamins, minerals, and filling fiber. Recommendations include simple and healthy ideas for soups and desserts.   Overview     The Pritikin Solution Program Overview Clinical staff conducted group or individual video education with verbal and written material and guidebook.  Patient learns that the results of the [place] Program have been documented in more than 100 articles published in peer-reviewed journals, and the benefits include reducing risk factors for (and, in some cases, even reversing) high cholesterol, high blood pressure, type 2 diabetes, obesity, and more! An overview of the three key pillars of the Pritikin Program will be covered: eating well, doing regular exercise, and having a healthy mind-set.  WORKSHOPS  Exercise: Exercise Basics: Building Your Action Plan Clinical staff led group instruction and group discussion with PowerPoint presentation and patient guidebook. To enhance the learning environment the use of posters, models and videos may be added. At the conclusion of this workshop, patients will comprehend the difference between physical activity and exercise, as well as the benefits of incorporating both, into their routine. Patients will understand the FITT (Frequency, Intensity, Time, and Type) principle and how to use it to build an exercise action plan. In addition, safety concerns and other considerations for exercise and cardiac rehab will be addressed by the presenter. The purpose of this lesson is to promote a comprehensive and effective weekly exercise routine in order to improve patients' overall level of  fitness.   Managing Heart Disease: Your Path to a Healthier Heart Clinical staff led group instruction and group discussion with PowerPoint presentation and patient guidebook. To enhance the learning environment the use of posters, models and videos may be added.At the conclusion of this workshop, patients will understand the anatomy and physiology of the heart. Additionally, they will understand how Pritikin's three pillars impact the risk factors, the progression, and the management of heart disease.  The purpose of this lesson is to provide a high-level overview of the heart, heart disease, and how the Pritikin lifestyle positively impacts risk factors.  Exercise Biomechanics Clinical staff led group instruction and group discussion with PowerPoint presentation and patient guidebook. To enhance the learning environment the use of posters, models and videos may be added. Patients will learn how the structural parts of their bodies function and how these functions impact their daily activities, movement, and exercise. Patients will  learn how to promote a neutral spine, learn how to manage pain, and identify ways to improve their physical movement in order to promote healthy living. The purpose of this lesson is to expose patients to common physical limitations that impact physical activity. Participants will learn practical ways to adapt and manage aches and pains, and to minimize their effect on regular exercise. Patients will learn how to maintain good posture while sitting, walking, and lifting.  Balance Training and Fall Prevention  Clinical staff led group instruction and group discussion with PowerPoint presentation and patient guidebook. To enhance the learning environment the use of posters, models and videos may be added. At the conclusion of this workshop, patients will understand the importance of their sensorimotor skills (vision, proprioception, and the vestibular system)  in maintaining their ability to balance as they age. Patients will apply a variety of balancing exercises that are appropriate for their current level of function. Patients will understand the common causes for poor balance, possible solutions to these problems, and ways to modify their physical environment in order to minimize their fall risk. The purpose of this lesson is to teach patients about the importance of maintaining balance as they age and ways to minimize their risk of falling.  WORKSHOPS   Nutrition:  Fueling a Scientist, research (physical sciences) led group instruction and group discussion with PowerPoint presentation and patient guidebook. To enhance the learning environment the use of posters, models and videos may be added. Patients will review the foundational principles of the [place] and understand what constitutes a serving size in each of the food groups. Patients will also learn Pritikin-friendly foods that are better choices when away from home and review make-ahead meal and snack options. Calorie density will be reviewed and applied to three nutrition priorities: weight maintenance, weight loss, and weight gain. The purpose of this lesson is to reinforce (in a group setting) the key concepts around what patients are recommended to eat and how to apply these guidelines when away from home by planning and selecting Pritikin-friendly options. Patients will understand how calorie density may be adjusted for different weight management goals.  Mindful Eating  Clinical staff led group instruction and group discussion with PowerPoint presentation and patient guidebook. To enhance the learning environment the use of posters, models and videos may be added. Patients will briefly review the concepts of the [place] and the importance of low-calorie dense foods. The concept of mindful eating will be introduced as well as the importance of paying attention to internal hunger  signals. Triggers for non-hunger eating and techniques for dealing with triggers will be explored. The purpose of this lesson is to provide patients with the opportunity to review the basic principles of the [place], discuss the value of eating mindfully and how to measure internal cues of hunger and fullness using the Hunger Scale. Patients will also discuss reasons for non-hunger eating and learn strategies to use for controlling emotional eating.  Targeting Your Nutrition Priorities Clinical staff led group instruction and group discussion with PowerPoint presentation and patient guidebook. To enhance the learning environment the use of posters, models and videos may be added. Patients will learn how to determine their genetic susceptibility to disease by reviewing their family history. Patients will gain insight into the importance of diet as part of an overall healthy lifestyle in mitigating the impact of genetics and other environmental insults. The purpose of this lesson is to provide patients with the opportunity  to assess their personal nutrition priorities by looking at their family history, their own health history and current risk factors. Patients will also be able to discuss ways of prioritizing and modifying the [place] for their highest risk areas  Menu  Clinical staff led group instruction and group discussion with PowerPoint presentation and patient guidebook. To enhance the learning environment the use of posters, models and videos may be added. Using menus brought in from [employer], or printed from [employer], patients will apply the [place] dining out guidelines that were presented in the [employer] video. Patients will also be able to practice these guidelines in a variety of provided scenarios. The purpose of this lesson is to provide patients with the opportunity to practice hands-on learning of the [place]  with actual menus and practice scenarios.  Label Reading Clinical staff led group instruction and group discussion with PowerPoint presentation and patient guidebook. To enhance the learning environment the use of posters, models and videos may be added. Patients will review and discuss the Pritikin label reading guidelines presented in Pritikin's Label Reading Educational series video. Using fool labels brought in from local grocery stores and markets, patients will apply the label reading guidelines and determine if the packaged food meet the Pritikin guidelines. The purpose of this lesson is to provide patients with the opportunity to review, discuss, and practice hands-on learning of the Pritikin Label Reading guidelines with actual packaged food labels. [place] Workshops are designed to teach patients ways to prepare quick, simple, and affordable recipes at home. The importance of nutrition's role in chronic disease risk reduction is reflected in its emphasis in the overall Pritikin program. By learning how to prepare essential core Pritikin Eating Plan recipes, patients will increase control over what they eat; be able to customize the flavor of foods without the use of added salt, sugar, or fat; and improve the quality of the food they consume. By learning a set of core recipes which are easily assembled, quickly prepared, and affordable, patients are more likely to prepare more healthy foods at home. These workshops focus on convenient breakfasts, simple entres, side dishes, and desserts which can be prepared with minimal effort and are consistent with nutrition recommendations for cardiovascular risk reduction. Cooking [employer] are taught by a Engineer, materials (RD) who has been trained by the [employer]. The chef or RD has a clear understanding of the importance of minimizing - if not completely eliminating - added fat, sugar, and  sodium in recipes. Throughout the series of [place] Workshop sessions, patients will learn about healthy ingredients and efficient methods of cooking to build confidence in their capability to prepare    Cooking School weekly topics:  Adding Flavor- Sodium-Free  Fast and Healthy Breakfasts  Powerhouse Plant-Based Proteins  Satisfying Salads and Dressings  Simple Sides and Sauces  International Cuisine-Spotlight on the [place] Zones  Delicious Desserts  Savory Soups  Efficiency Cooking - Meals in a Snap  Tasty Appetizers and Snacks  Comforting Weekend Breakfasts  One-Pot Wonders   Fast Evening Meals  Easy [place] (Psychosocial): New Thoughts, New Behaviors Clinical staff led group instruction and group discussion with PowerPoint presentation and patient guidebook. To enhance the learning environment the use of posters, models and videos may be added. Patients will learn and practice techniques for developing effective health and  lifestyle goals. Patients will be able to effectively apply the goal setting process learned to develop at least one new personal goal.  The purpose of this lesson is to expose patients to a new skill set of behavior modification techniques such as techniques setting SMART goals, overcoming barriers, and achieving new thoughts and new behaviors.  Managing Moods and Relationships Clinical staff led group instruction and group discussion with PowerPoint presentation and patient guidebook. To enhance the learning environment the use of posters, models and videos may be added. Patients will learn how emotional and chronic stress factors can impact their health and relationships. They will learn healthy ways to manage their moods and utilize positive coping mechanisms. In addition, ICR patients will learn ways to improve communication skills. The purpose of this lesson is to expose patients to ways  of understanding how one's mood and health are intimately connected. Developing a healthy outlook can help build positive relationships and connections with others. Patients will understand the importance of utilizing effective communication skills that include actively listening and being heard. They will learn and understand the importance of the "4 Cs" and especially Connections in fostering of a Healthy Mind-Set.  Healthy Sleep for a Healthy Heart Clinical staff led group instruction and group discussion with PowerPoint presentation and patient guidebook. To enhance the learning environment the use of posters, models and videos may be added. At the conclusion of this workshop, patients will be able to demonstrate knowledge of the importance of sleep to overall health, well-being, and quality of life. They will understand the symptoms of, and treatments for, common sleep disorders. Patients will also be able to identify daytime and nighttime behaviors which impact sleep, and they will be able to apply these tools to help manage sleep-related challenges. The purpose of this lesson is to provide patients with a general overview of sleep and outline the importance of quality sleep. Patients will learn about a few of the most common sleep disorders. Patients will also be introduced to the concept of "sleep hygiene," and discover ways to self-manage certain sleeping problems through simple daily behavior changes. Finally, the workshop will motivate patients by clarifying the links between quality sleep and their goals of heart-healthy living.   Recognizing and Reducing Stress Clinical staff led group instruction and group discussion with PowerPoint presentation and patient guidebook. To enhance the learning environment the use of posters, models and videos may be added. At the conclusion of this workshop, patients will be able to understand the types of stress reactions, differentiate between acute and chronic  stress, and recognize the impact that chronic stress has on their health. They will also be able to apply different coping mechanisms, such as reframing negative self-talk. Patients will have the opportunity to practice a variety of stress management techniques, such as deep abdominal breathing, progressive muscle relaxation, and/or guided imagery.  The purpose of this lesson is to educate patients on the role of stress in their lives and to provide healthy techniques for coping with it.  Learning Barriers/Preferences:  Learning Barriers/Preferences - 10/16/22 1403       Learning Barriers/Preferences   Learning Barriers Inability to learn new things    Learning Preferences Skilled Demonstration;Video;Pictoral             Education Topics:  Knowledge Questionnaire Score:  Knowledge Questionnaire Score - 10/16/22 1356       Knowledge Questionnaire Score   Pre Score 19/24  Core Components/Risk Factors/Patient Goals at Admission:  Personal Goals and Risk Factors at Admission - 10/16/22 1357       Core Components/Risk Factors/Patient Goals on Admission    Weight Management Yes;Obesity    Intervention Weight Management/Obesity: Establish reasonable short term and long term weight goals.;Obesity: Provide education and appropriate resources to help participant work on and attain dietary goals.    Admit Weight 210 lb 12.2 oz (95.6 kg)    Expected Outcomes Short Term: Continue to assess and modify interventions until short term weight is achieved;Long Term: Adherence to nutrition and physical activity/exercise program aimed toward attainment of established weight goal;Weight Loss: Understanding of general recommendations for a balanced deficit meal plan, which promotes 1-2 lb weight loss per week and includes a negative energy balance of [phone] kcal/d    Hypertension Yes    Intervention Provide education on lifestyle modifcations including regular physical  activity/exercise, weight management, moderate sodium restriction and increased consumption of fresh fruit, vegetables, and low fat dairy, alcohol moderation, and smoking cessation.;Monitor prescription use compliance.    Expected Outcomes Short Term: Continued assessment and intervention until BP is < 140/5m HG in hypertensive participants. < 130/83mHG in hypertensive participants with diabetes, heart failure or chronic kidney disease.;Long Term: Maintenance of blood pressure at goal levels.    Lipids Yes    Intervention Provide education and support for participant on nutrition & aerobic/resistive exercise along with prescribed medications to achieve LDL '70mg'$ , HDL >'40mg'$ .    Expected Outcomes Short Term: Participant states understanding of desired cholesterol values and is compliant with medications prescribed. Participant is following exercise prescription and nutrition guidelines.;Long Term: Cholesterol controlled with medications as prescribed, with individualized exercise RX and with personalized nutrition plan. Value goals: LDL < '70mg'$ , HDL > 40 mg.             Core Components/Risk Factors/Patient Goals Review:    Core Components/Risk Factors/Patient Goals at Discharge (Final Review):    ITP Comments:  ITP Comments     Row Name 10/16/22 1015           ITP Comments Medical Director- Dr. TrFransico HimMD. Introduction to Pritikin Education Program/ Intensive Cardiac Rehab. Initial orientation packet reviwed with the patient                Comments: Participant attended orientation for the cardiac rehabilitation program on  10/16/2022  to perform initial intake and exercise walk test. Patient introduced to the [place] and orientation packet was reviewed. Completed 6-minute walk test, measurements, initial ITP, and exercise prescription. Vital signs stable. Telemetry-normal sinus rhythm, c/o mild SOB during walk test, which resolved with rest.   Service  time was from 1015 to 1206.

## 2022-10-16 NOTE — Telephone Encounter (Signed)
Traci [name] returned RN's call this AM confirming her appointment. RN was able to complete the "Cardiac Rehab Cardiac Risk Profile Nursing Assessment" with the patient. Directions to building, clothing to wear and how the orientation will be performed was given to the patient. All questions answered, pt understands without assistance.

## 2022-10-22 ENCOUNTER — Encounter (HOSPITAL_COMMUNITY): Payer: Medicare HMO

## 2022-10-24 ENCOUNTER — Encounter (HOSPITAL_COMMUNITY)
Admission: RE | Admit: 2022-10-24 | Discharge: 2022-10-24 | Disposition: A | Discharge status: Home / Self Care | Payer: Medicare HMO | Source: Ambulatory Visit | Attending: Cardiovascular Disease | Admitting: Cardiovascular Disease

## 2022-10-24 DIAGNOSIS — Z952 Presence of prosthetic heart valve: Secondary | ICD-10-CM | POA: Diagnosis not present

## 2022-10-24 DIAGNOSIS — Z48812 Encounter for surgical aftercare following surgery on the circulatory system: Secondary | ICD-10-CM | POA: Diagnosis not present

## 2022-10-24 NOTE — Progress Notes (Signed)
Daily Session Note  Patient Details  Name: Traci [name] MRN: [id number] Date of Birth: 08-Sep-1941 Referring Provider:   Flowsheet Row INTENSIVE CARDIAC REHAB ORIENT from 10/16/2022 in [facility]  Referring Provider Sanda Klein, MD       Encounter Date: 10/24/2022  Check In:  Session Check In - 10/24/22 1456       Check-In   Supervising physician immediately available to respond to emergencies [facility] - Physician supervision    Physician(s) Dr. Angelena Form    Location [facility]    Staff Present Barnet Pall, RN, Deland Pretty, MS, ACSM-CEP, Exercise Physiologist;Johnny Starleen Blue, MS, Exercise Physiologist;Samantha [place], RD, LDN;Jetta Walker BS, ACSM-CEP, Exercise Physiologist    Virtual Visit No    Medication changes reported     No    Fall or balance concerns reported    No    Tobacco Cessation No Change    Warm-up and Cool-down Performed as group-led instruction    Resistance Training Performed No    VAD Patient? No    PAD/SET Patient? No      Pain Assessment   Currently in Pain? No/denies    Pain Score 0-No pain    Multiple Pain Sites No             Capillary Blood Glucose: No results found for this or any previous visit (from the past 24 hour(s)).   Exercise Prescription Changes - 10/24/22 1614       Response to Exercise   Blood Pressure (Admit) 138/60    Blood Pressure (Exercise) 152/70    Blood Pressure (Exit) 132/70    Heart Rate (Admit) 75 bpm    Heart Rate (Exercise) 98 bpm    Heart Rate (Exit) 74 bpm    Rating of Perceived Exertion (Exercise) 13    Perceived Dyspnea (Exercise) 0    Symptoms 0    Comments Pt first day in the CRP2 program    Duration Progress to 30 minutes of  aerobic without signs/symptoms of physical distress    Intensity THRR unchanged      Progression   Progression Continue to progress workloads to maintain intensity without signs/symptoms of  physical distress.      T5 Nustep   Level 1    SPM 58    Minutes 30    METs 1.8             Social History   Tobacco Use  Smoking Status Never   Passive exposure: Never  Smokeless Tobacco Never    Goals Met:  Exercise tolerated well No report of concerns or symptoms today  Goals Unmet:  Not Applicable  Comments: Pt started cardiac rehab today.  Pt tolerated light exercise without difficulty. VSS, telemetry-Sinus Rhythm, asymptomatic.  Medication list reconciled. Pt denies barriers to medicaiton compliance.  PSYCHOSOCIAL ASSESSMENT:  PHQ-0. Pt exhibits positive coping skills, hopeful outlook with supportive family. No psychosocial needs identified at this time, no psychosocial interventions necessary.    [place].   Pt oriented to exercise equipment and routine.    Understanding verbalized. Harrell Gave RN BSN    Dr. Fransico Him is Medical Director for Cardiac Rehab at [facility].

## 2022-10-26 ENCOUNTER — Encounter (HOSPITAL_COMMUNITY)
Admission: RE | Admit: 2022-10-26 | Discharge: 2022-10-26 | Disposition: A | Discharge status: Home / Self Care | Payer: Medicare HMO | Source: Ambulatory Visit | Attending: Cardiovascular Disease | Admitting: Cardiovascular Disease

## 2022-10-26 DIAGNOSIS — Z952 Presence of prosthetic heart valve: Secondary | ICD-10-CM | POA: Diagnosis not present

## 2022-10-26 DIAGNOSIS — Z48812 Encounter for surgical aftercare following surgery on the circulatory system: Secondary | ICD-10-CM | POA: Diagnosis not present

## 2022-10-29 ENCOUNTER — Encounter (HOSPITAL_COMMUNITY)
Admission: RE | Admit: 2022-10-29 | Discharge: 2022-10-29 | Disposition: A | Discharge status: Home / Self Care | Payer: Medicare HMO | Source: Ambulatory Visit | Attending: Cardiovascular Disease | Admitting: Cardiovascular Disease

## 2022-10-29 DIAGNOSIS — Z952 Presence of prosthetic heart valve: Secondary | ICD-10-CM | POA: Diagnosis not present

## 2022-10-29 DIAGNOSIS — Z48812 Encounter for surgical aftercare following surgery on the circulatory system: Secondary | ICD-10-CM | POA: Diagnosis not present

## 2022-10-31 ENCOUNTER — Encounter (HOSPITAL_COMMUNITY)
Admission: RE | Admit: 2022-10-31 | Discharge: 2022-10-31 | Disposition: A | Discharge status: Home / Self Care | Payer: Medicare HMO | Source: Ambulatory Visit | Attending: Cardiovascular Disease | Admitting: Cardiovascular Disease

## 2022-10-31 DIAGNOSIS — Z952 Presence of prosthetic heart valve: Secondary | ICD-10-CM

## 2022-10-31 DIAGNOSIS — Z48812 Encounter for surgical aftercare following surgery on the circulatory system: Secondary | ICD-10-CM | POA: Diagnosis not present

## 2022-11-02 ENCOUNTER — Encounter (HOSPITAL_COMMUNITY)
Admission: RE | Admit: 2022-11-02 | Discharge: 2022-11-02 | Disposition: A | Discharge status: Home / Self Care | Payer: Medicare HMO | Source: Ambulatory Visit | Attending: Cardiovascular Disease | Admitting: Cardiovascular Disease

## 2022-11-02 ENCOUNTER — Encounter (HOSPITAL_COMMUNITY): Payer: Medicare HMO

## 2022-11-02 DIAGNOSIS — Z952 Presence of prosthetic heart valve: Secondary | ICD-10-CM

## 2022-11-02 DIAGNOSIS — N813 Complete uterovaginal prolapse: Secondary | ICD-10-CM | POA: Diagnosis not present

## 2022-11-02 DIAGNOSIS — N130 Hydronephrosis with ureteropelvic junction obstruction: Secondary | ICD-10-CM | POA: Diagnosis not present

## 2022-11-02 DIAGNOSIS — Z48812 Encounter for surgical aftercare following surgery on the circulatory system: Secondary | ICD-10-CM | POA: Diagnosis not present

## 2022-11-07 ENCOUNTER — Encounter (HOSPITAL_COMMUNITY)
Admission: RE | Admit: 2022-11-07 | Discharge: 2022-11-07 | Disposition: A | Discharge status: Home / Self Care | Payer: Medicare HMO | Source: Ambulatory Visit | Attending: Cardiovascular Disease | Admitting: Cardiovascular Disease

## 2022-11-07 DIAGNOSIS — Z952 Presence of prosthetic heart valve: Secondary | ICD-10-CM

## 2022-11-07 NOTE — Progress Notes (Signed)
CARDIAC REHAB PHASE 2  Reviewed home exercise with pt today. Pt is tolerating exercise well. Pt will continue to exercise on her own by walking for 15-30 minutes per session 1-2 days a week as tolerated in addition to the 3 days in CRP2. Advised pt on THRR, RPE scale, hydration and temperature/humidity precautions. Reinforced S/S to stop exercise and when to call MD vs 911. Encouraged warm up cool down and stretches with exercise sessions. Pt verbalized understanding, all questions were answered and pt was given a copy to take home.    Kirby Funk ACSM-CEP 11/07/2022 4:51 PM

## 2022-11-09 ENCOUNTER — Encounter (HOSPITAL_COMMUNITY)
Admission: RE | Admit: 2022-11-09 | Discharge: 2022-11-09 | Disposition: A | Discharge status: Home / Self Care | Payer: Medicare HMO | Source: Ambulatory Visit | Attending: Cardiovascular Disease | Admitting: Cardiovascular Disease

## 2022-11-09 DIAGNOSIS — Z952 Presence of prosthetic heart valve: Secondary | ICD-10-CM

## 2022-11-09 DIAGNOSIS — Z48812 Encounter for surgical aftercare following surgery on the circulatory system: Secondary | ICD-10-CM | POA: Diagnosis not present

## 2022-11-13 NOTE — Progress Notes (Signed)
Cardiac Individual Treatment Plan  Patient Details  Name: Traci [name] MRN: [id number] Date of Birth: Jan 15, 1941 Referring Provider:   Flowsheet Row INTENSIVE CARDIAC REHAB ORIENT from 10/16/2022 [facility] [place]  Referring Provider Croitoru, Dani Gobble, MD       Initial Encounter Date:  [place] from 10/16/2022 in [facility]  Date 10/16/22       Visit Diagnosis: 09/04/22 TAVR (transcatheter aortic valve replacement)  Patient's Home Medications on Admission:  Current Outpatient Medications:    amoxicillin (AMOXIL) 500 MG tablet, Take 4 tablets by mouth 1 hour before dental procedures and cleanings, Disp: 12 tablet, Rfl: 6   Artificial Tear Solution (SOOTHE XP) SOLN, Place 1 drop into both eyes daily., Disp: , Rfl:    aspirin EC 81 MG tablet, Take 81 mg by mouth daily. Swallow whole., Disp: , Rfl:    atorvastatin (LIPITOR) 20 MG tablet, Take 20 mg by mouth at bedtime., Disp: , Rfl:    cholecalciferol (VITAMIN D) 1000 UNITS tablet, Take 1,000 Units by mouth daily., Disp: , Rfl:    lisinopril (ZESTRIL) 20 MG tablet, TAKE 1 TABLET BY MOUTH EVERY DAY, Disp: 90 tablet, Rfl: 3   pantoprazole (PROTONIX) 40 MG tablet, Take 40 mg by mouth daily.  , Disp: , Rfl:    traMADol (ULTRAM) 50 MG tablet, Take 1 tablet (50 mg total) by mouth every 6 (six) hours as needed. (Patient not taking: Reported on 10/16/2022), Disp: 15 tablet, Rfl: 0  Past Medical History: Past Medical History:  Diagnosis Date   GERD (gastroesophageal reflux disease)    High cholesterol    Hypertension    Irregular heart rate    S/P TAVR (transcatheter aortic valve replacement) 09/04/2022   s/p TAVR with a 74m Edwards S3UR via the TF approach by Dr. [name]& Dr. [name]   Tobacco Use: Social History   Tobacco Use  Smoking Status Never   Passive exposure: Never   Smokeless Tobacco Never    Labs: Review Flowsheet       Latest Ref Rng & Units 02/10/2011 02/11/2011 06/20/2022 09/04/2022  Labs for ITP Cardiac and Pulmonary Rehab  Cholestrol 0 - 200 mg/dL 151        ATP III CLASSIFICATION:  <200     mg/dL   Desirable  200-239  mg/dL   Borderline High  >=240    mg/dL   High         135        ATP III CLASSIFICATION:  <200     mg/dL   Desirable  200-239  mg/dL   Borderline High  >=240    mg/dL   High         - -  LDL (calc) 0 - 99 mg/dL 71        Total Cholesterol/HDL:CHD Risk Coronary Heart Disease Risk Table                     Men   Women  1/2 Average Risk   3.4   3.3  Average Risk       5.0   4.4  2 X Average Risk   9.6   7.1  3 X Average Risk  23.4   11.0        Use the calculated Patient Ratio above and the CHD Risk Table to determine the  patient's CHD Risk.        ATP III CLASSIFICATION (LDL):  <100     mg/dL   Optimal  100-129  mg/dL   Near or Above                    Optimal  130-159  mg/dL   Borderline  160-189  mg/dL   High  >190     mg/dL   Very High  62        Total Cholesterol/HDL:CHD Risk Coronary Heart Disease Risk Table                     Men   Women  1/2 Average Risk   3.4   3.3  Average Risk       5.0   4.4  2 X Average Risk   9.6   7.1  3 X Average Risk  23.4   11.0        Use the calculated Patient Ratio above and the CHD Risk Table to determine the patient's CHD Risk.        ATP III CLASSIFICATION (LDL):  <100     mg/dL   Optimal  100-129  mg/dL   Near or Above                    Optimal  130-159  mg/dL   Borderline  160-189  mg/dL   High  >190     mg/dL   Very High  - -  HDL-C >39 mg/dL 73  60  - -  Trlycerides <150 mg/dL 35  66  - -  Hemoglobin A1c <5.7 % 5.6 (NOTE)                                                                       According to the ADA Clinical Practice Recommendations for 2011, when HbA1c is used as a screening test:   >=6.5%   Diagnostic of Diabetes Mellitus           (if  abnormal result  is confirmed)  5.7-6.4%   Increased risk of developing Diabetes Mellitus  References:Diagnosis and Classification of Diabetes Mellitus,Diabetes TGGY,6948,54(OEVOJ 1):S62-S69 and Standards of Medical Care in         Diabetes - 2011,Diabetes Care,2011,34  (Suppl 1):S11-S61.  - - -  PH, Arterial 7.35 - 7.45 - - 7.369  -  PCO2 arterial 32 - 48 mmHg - - 45.7  -  Bicarbonate 20.0 - 28.0 mmol/L - - 26.3  24.4  -  TCO2 22 - 32 mmol/L - - _0 Acid-base deficit 0.0 - 2.0 mmol/L - - 1.0  -  O2 Saturation % - - 91  72  -    Capillary Blood Glucose: No results found for: "GLUCAP"   Exercise Target Goals: Exercise Program Goal: Individual exercise prescription set using results from initial 6 min walk test and THRR while considering  patient's activity barriers and safety.   Exercise Prescription Goal: Initial exercise prescription builds to 30-45 minutes a day of aerobic activity, 2-3 days per week.  Home exercise guidelines will be given to patient during program as part of  exercise prescription that the participant will acknowledge.  Activity Barriers & Risk Stratification:  Activity Barriers & Cardiac Risk Stratification - 10/16/22 1144       Activity Barriers & Cardiac Risk Stratification   Activity Barriers Back Problems;Other (comment)    Comments Occassional back stiffness.    Cardiac Risk Stratification High             6 Minute Walk:  6 Minute Walk     Row Name 10/16/22 1134         6 Minute Walk   Phase Initial     Distance 1034 feet     Walk Time 6 minutes     # of Rest Breaks 1  One 24 second standing rest break taken.     MPH 1.96     METS 1.96     RPE 12     Perceived Dyspnea  1     VO2 Peak 6.87     Symptoms Yes (comment)     Comments Mild shortness of breath.     Resting HR 82 bpm     Resting BP 142/84     Resting Oxygen Saturation  96 %     Exercise Oxygen Saturation  during 6 min walk 98 %     Max Ex. HR 118 bpm     Max  Ex. BP 168/64     2 Minute Post BP 148/68              Oxygen Initial Assessment:   Oxygen Re-Evaluation:   Oxygen Discharge (Final Oxygen Re-Evaluation):   Initial Exercise Prescription:  Initial Exercise Prescription - 10/16/22 1200       Date of Initial Exercise RX and Referring Provider   Date 10/16/22    Referring Provider Croitoru, Dani Gobble, MD    Expected Discharge Date 12/14/22      T5 Nustep   Level 1    SPM 75    Minutes 30    METs 1.5      Prescription Details   Frequency (times per week) 3    Duration Progress to 30 minutes of continuous aerobic without signs/symptoms of physical distress      Intensity   THRR 40-80% of Max Heartrate 55-110    Ratings of Perceived Exertion 11-13    Perceived Dyspnea 0-4      Progression   Progression Continue to progress workloads to maintain intensity without signs/symptoms of physical distress.      Resistance Training   Training Prescription Yes    Weight 1 lb    Reps 10-15             Perform Capillary Blood Glucose checks as needed.  Exercise Prescription Changes:   Exercise Prescription Changes     Row Name 10/24/22 1614 11/07/22 1645           Response to Exercise   Blood Pressure (Admit) 138/60 118/74      Blood Pressure (Exercise) 152/70 150/62      Blood Pressure (Exit) 132/70 142/70      Heart Rate (Admit) 75 bpm 79 bpm      Heart Rate (Exercise) 98 bpm 108 bpm      Heart Rate (Exit) 74 bpm 80 bpm      Rating of Perceived Exertion (Exercise) 13 13      Perceived Dyspnea (Exercise) 0 0      Symptoms 0 0      Comments Pt first day in the  CRP2 program Reviewed MET's, goals and ExRx      Duration Progress to 30 minutes of  aerobic without signs/symptoms of physical distress Progress to 30 minutes of  aerobic without signs/symptoms of physical distress      Intensity THRR unchanged THRR unchanged        Progression   Progression Continue to progress workloads to maintain intensity  without signs/symptoms of physical distress. Continue to progress workloads to maintain intensity without signs/symptoms of physical distress.      Average METs -- 1.9        Resistance Training   Training Prescription -- No        T5 Nustep   Level 1 2      SPM 58 72      Minutes 30 30      METs 1.8 1.9        Home Exercise Plan   Plans to continue exercise at -- Home (comment)      Frequency -- Add 2 additional days to program exercise sessions.      Initial Home Exercises Provided -- 11/07/22               Exercise Comments:   Exercise Comments     Row Name 10/24/22 1618 11/07/22 1650         Exercise Comments Pt first day in the CRP2 program. Pt tolerated exercise well with an average MET level of 1.8. Pt is learning her THRR, RPE and ExRx. Overall pt off to a great start and feels good. Will continue to monitor pt and progress workloads as tolerated without sign or symptom Reviewed MET's, goals and home ExRx. Pt tolerated exercise well with an average MET level of 1.9. Pt is feeling good about her goals and feels like shes on the right track for increasing strength and endurance. Pt will continue to exercise by walking 1-2 days a week by walking for 15-30 mins as tolerated. Pt is interested in joining the [facility] when she graduated but feels like it will be too much right now.               Exercise Goals and Review:   Exercise Goals     Row Name 10/16/22 1144             Exercise Goals   Increase Physical Activity Yes       Intervention Provide advice, education, support and counseling about physical activity/exercise needs.;Develop an individualized exercise prescription for aerobic and resistive training based on initial evaluation findings, risk stratification, comorbidities and participant's personal goals.       Expected Outcomes Short Term: Attend rehab on a regular basis to increase amount of physical activity.;Long Term: Exercising regularly at least 3-5  days a week.;Long Term: Add in home exercise to make exercise part of routine and to increase amount of physical activity.       Increase Strength and Stamina Yes       Intervention Provide advice, education, support and counseling about physical activity/exercise needs.;Develop an individualized exercise prescription for aerobic and resistive training based on initial evaluation findings, risk stratification, comorbidities and participant's personal goals.       Expected Outcomes Short Term: Increase workloads from initial exercise prescription for resistance, speed, and METs.;Short Term: Perform resistance training exercises routinely during rehab and add in resistance training at home;Long Term: Improve cardiorespiratory fitness, muscular endurance and strength as measured by increased METs and functional capacity (6MWT)  Able to understand and use rate of perceived exertion (RPE) scale Yes       Intervention Provide education and explanation on how to use RPE scale       Expected Outcomes Short Term: Able to use RPE daily in rehab to express subjective intensity level;Long Term:  Able to use RPE to guide intensity level when exercising independently       Knowledge and understanding of Target Heart Rate Range (THRR) Yes       Intervention Provide education and explanation of THRR including how the numbers were predicted and where they are located for reference       Expected Outcomes Short Term: Able to state/look up THRR;Long Term: Able to use THRR to govern intensity when exercising independently;Short Term: Able to use daily as guideline for intensity in rehab       Able to check pulse independently Yes       Intervention Provide education and demonstration on how to check pulse in carotid and radial arteries.;Review the importance of being able to check your own pulse for safety during independent exercise       Expected Outcomes Short Term: Able to explain why pulse checking is important  during independent exercise;Long Term: Able to check pulse independently and accurately       Understanding of Exercise Prescription Yes       Intervention Provide education, explanation, and written materials on patient's individual exercise prescription       Expected Outcomes Short Term: Able to explain program exercise prescription;Long Term: Able to explain home exercise prescription to exercise independently                Exercise Goals Re-Evaluation :  Exercise Goals Re-Evaluation     Row Name 10/24/22 1616 11/07/22 1647           Exercise Goal Re-Evaluation   Exercise Goals Review Increase Physical Activity;Understanding of Exercise Prescription;Knowledge and understanding of Target Heart Rate Range (THRR);Able to understand and use rate of perceived exertion (RPE) scale;Increase Strength and Stamina Increase Physical Activity;Understanding of Exercise Prescription;Knowledge and understanding of Target Heart Rate Range (THRR);Able to understand and use rate of perceived exertion (RPE) scale;Increase Strength and Stamina      Comments Pt first day in the CRP2 program. Pt tolerated exercise well with an average MET level of 1.8. Pt is learning her THRR, RPE and ExRx. Overall pt off to a great start and feels good. Reviewed MET's, goals and home ExRx. Pt tolerated exercise well with an average MET level of 1.9. Pt is feeling good about her goals and feels like shes on the right track for increasing strength and endurance. Pt will continue to exercise by walking 1-2 days a week by walking for 15-30 mins as tolerated. Pt is interested in joining the [facility] when she graduated but feels like it will be too much right now.      Expected Outcomes Will continue to monitor pt and progress workloads as tolerated without sign or symptom Pt will addin walking 1-2 days 15-30 mins as tolerated. Will continue to monitor pt and progress workloads as tolerated without sign or symptom                Discharge Exercise Prescription (Final Exercise Prescription Changes):  Exercise Prescription Changes - 11/07/22 1645       Response to Exercise   Blood Pressure (Admit) 118/74    Blood Pressure (Exercise) 150/62    Blood Pressure (Exit)  142/70    Heart Rate (Admit) 79 bpm    Heart Rate (Exercise) 108 bpm    Heart Rate (Exit) 80 bpm    Rating of Perceived Exertion (Exercise) 13    Perceived Dyspnea (Exercise) 0    Symptoms 0    Comments Reviewed MET's, goals and ExRx    Duration Progress to 30 minutes of  aerobic without signs/symptoms of physical distress    Intensity THRR unchanged      Progression   Progression Continue to progress workloads to maintain intensity without signs/symptoms of physical distress.    Average METs 1.9      Resistance Training   Training Prescription No      T5 Nustep   Level 2    SPM 72    Minutes 30    METs 1.9      Home Exercise Plan   Plans to continue exercise at Home (comment)    Frequency Add 2 additional days to program exercise sessions.    Initial Home Exercises Provided 11/07/22             Nutrition:  Target Goals: Understanding of nutrition guidelines, daily intake of sodium <1512m, cholesterol <2027m calories 30% from fat and 7% or less from saturated fats, daily to have 5 or more servings of fruits and vegetables.  Biometrics:  Pre Biometrics - 10/16/22 1015       Pre Biometrics   Waist Circumference 43.5 inches    Hip Circumference 49.75 inches    Waist to Hip Ratio 0.87 %    Triceps Skinfold 34 mm    % Body Fat 46.4 %    Grip Strength 14 kg    Flexibility --   Not performed due to back stiffness.   Single Leg Stand 7.81 seconds              Nutrition Therapy Plan and Nutrition Goals:  Nutrition Therapy & Goals - 10/25/22 0815       Nutrition Therapy   Diet Heart Healthy Diet    Drug/Food Interactions Statins/Certain Fruits      Personal Nutrition Goals   Nutrition Goal Patient to identify  strategies for improving cardiovascular risk by attending the weekly Pritikin education series    Personal Goal #2 Patient to improve diet quality by using the plate method as a guide for meal planning to include lean protein/plant protein, fruits, vegetables, whole grains, and nonfat dairy as part of a heart healthy diet    Personal Goal #3 Patient to limit to <150072mf sodium daily.    Comments SarDaniceve alone and does very little cooking at this point. She eats out frequently and admits her diet has room for improvement. SarNekitall benefit from adherance to the [place] plan and attending cardiac rehab to support LDL <15m25m, HDL >40mg50m sodium intake <1500mg/82m and improve diet quality.      Intervention Plan   Intervention Prescribe, educate and counsel regarding individualized specific dietary modifications aiming towards targeted core components such as weight, hypertension, lipid management, diabetes, heart failure and other comorbidities.;Nutrition handout(s) given to patient.    Expected Outcomes Short Term Goal: Understand basic principles of dietary content, such as calories, fat, sodium, cholesterol and nutrients.;Long Term Goal: Adherence to prescribed nutrition plan.             Nutrition Assessments:  MEDIFICTS Score Key: ?70 Need to make dietary changes  40-70 Heart Healthy Diet ? 40 Therapeutic Level Cholesterol Diet    Picture  Your Plate Scores: <83 Unhealthy dietary pattern with much room for improvement. 41-50 Dietary pattern unlikely to meet recommendations for good health and room for improvement. 51-60 More healthful dietary pattern, with some room for improvement.  >60 Healthy dietary pattern, although there may be some specific behaviors that could be improved.    Nutrition Goals Re-Evaluation:  Nutrition Goals Re-Evaluation     [place] Name 10/25/22 0815             Goals   Current Weight 213 lb 6.5 oz (96.8 kg)       Comment hemoglobin 9.0.  hematocrit 27.5, GFR 59       Expected Outcome Alliah live alone and does very little cooking at this point. She eats out frequently and admits her diet has room for improvement. Cartina will benefit from adherance to the [place] plan and attending cardiac rehab to support LDL <71m/dL, HDL >454mdL, sodium intake <150019may, and improve diet quality.                Nutrition Goals Re-Evaluation:  Nutrition Goals Re-Evaluation     Row[place]creekme 10/25/22 0815             Goals   Current Weight 213 lb 6.5 oz (96.8 kg)       Comment hemoglobin 9.0. hematocrit 27.5, GFR 59       Expected Outcome Mersades live alone and does very little cooking at this point. She eats out frequently and admits her diet has room for improvement. SarAniqall benefit from adherance to the [place] plan and attending cardiac rehab to support LDL <5m31m, HDL >40mg75m sodium intake <1500mg/5m and improve diet quality.                Nutrition Goals Discharge (Final Nutrition Goals Re-Evaluation):  Nutrition Goals Re-Evaluation - 10/25/22 0815       Goals   Current Weight 213 lb 6.5 oz (96.8 kg)    Comment hemoglobin 9.0. hematocrit 27.5, GFR 59    Expected Outcome Krystall live alone and does very little cooking at this point. She eats out frequently and admits her diet has room for improvement. Katherleen wSloanebenefit from adherance to the [place] plan and attending cardiac rehab to support LDL <5mg/d35mDL >40mg/dL80mdium intake <1500mg/day38md improve diet quality.             Psychosocial: Target Goals: Acknowledge presence or absence of significant depression and/or stress, maximize coping skills, provide positive support system. Participant is able to verbalize types and ability to use techniques and skills needed for reducing stress and depression.  Initial Review & Psychosocial Screening:  Initial Psych Review & Screening - 10/16/22 1139       Initial Review   Current issues with None Identified       Family Dynamics   Good Support System? Yes   Sarah [place]one she has her son whom lives nearby. Sarah's other [place]   Psychosocial barriers to participate in program There are no identifiable barriers or psychosocial needs.      Screening Interventions   Interventions Encouraged to exercise             Quality of Life Scores:  Quality of Life - 10/16/22 1356       Quality of Life   Select Quality of Life      Quality of Life Scores   Health/Function Pre 22.71 %  Socioeconomic Pre 26.5 %    Psych/Spiritual Pre 25.64 %    Family Pre 27 %    GLOBAL Pre 24.6 %            Scores of 19 and below usually indicate a poorer quality of life in these areas.  A difference of  2-3 points is a clinically meaningful difference.  A difference of 2-3 points in the total score of the Quality of Life Index has been associated with significant improvement in overall quality of life, self-image, physical symptoms, and general health in studies assessing change in quality of life.  PHQ-9: Review Flowsheet       10/16/2022  Depression screen PHQ 2/9  Decreased Interest 0  Down, Depressed, Hopeless 0  PHQ - 2 Score 0   Interpretation of Total Score  Total Score Depression Severity:  1-4 = Minimal depression, 5-9 = Mild depression, 10-14 = Moderate depression, 15-19 = Moderately severe depression, 20-27 = Severe depression   Psychosocial Evaluation and Intervention:   Psychosocial Re-Evaluation:  Psychosocial Re-Evaluation     [place] Name 10/24/22 1627 11/13/22 1533           Psychosocial Re-Evaluation   Current issues with None Identified None Identified      Interventions Encouraged to attend Cardiac Rehabilitation for the exercise Encouraged to attend Cardiac Rehabilitation for the exercise      Continue Psychosocial Services  No Follow up required No Follow up required               Psychosocial Discharge (Final  Psychosocial Re-Evaluation):  Psychosocial Re-Evaluation - 11/13/22 1533       Psychosocial Re-Evaluation   Current issues with None Identified    Interventions Encouraged to attend Cardiac Rehabilitation for the exercise    Continue Psychosocial Services  No Follow up required             Vocational Rehabilitation: Provide vocational rehab assistance to qualifying candidates.   Vocational Rehab Evaluation & Intervention:  Vocational Rehab - 10/16/22 1138       Initial Vocational Rehab Evaluation & Intervention   Assessment shows need for Vocational Rehabilitation No   Goracke is retired and does not need vocational rehab at this time            Education: Education Goals: Education classes will be provided on a weekly basis, covering required topics. Participant will state understanding/return demonstration of topics presented.    Education     Row Name 10/24/22 1600     Education   Cardiac Education Topics Pritikin   Financial trader   Weekly Topic Fast Evening Meals   Instruction Review Code 1- Verbalizes Understanding  Butternut squash soup   Class Start Time 1355   Class Stop Time 1435   Class Time Calculation (min) 40 min    Row Name 10/26/22 1500     Education   Cardiac Education Topics Pritikin   Charity fundraiser Exercise Physiologist   Select Psychosocial   Psychosocial How Our Thoughts Can Heal Our Hearts   Instruction Review Code 1- Verbalizes Understanding   Class Start Time 1357   Class Stop Time 1440   Class Time Calculation (min) 43 min    [place] Name 10/29/22 [place]   [place]  Educator Dietitian   Select Nutrition   Nutrition Workshop Fueling a Designer, multimedia   Instruction Review Code 1- Verbalizes Understanding   Class Start Time 1300   Class Stop Time 1345   Class Time Calculation  (min) 45 min    [place] Name 10/31/22 1500     Education   Cardiac Education Topics Pritikin   Financial trader   Weekly Topic International Cuisine- Spotlight on the [place]   Instruction Review Code 1- Verbalizes Understanding   Class Start Time 1355   Class Stop Time 1435   Class Time Calculation (min) 40 min    [place] Name 11/02/22 1200     Education   Cardiac Education Topics Pritikin   Select Workshops     Workshops   Educator Exercise Physiologist   Select Psychosocial   Psychosocial Workshop Recognizing and Reducing Stress   Instruction Review Code 1- Verbalizes Understanding   Class Start Time 1143   Class Stop Time 1228   Class Time Calculation (min) 45 min    [place] Name 11/07/22 1600     Education   Cardiac Education Topics Pritikin   Charity fundraiser Exercise Physiologist   Select Nutrition   Nutrition Cooking - Healthy Salads and Dressing   Instruction Review Code 1- Verbalizes Understanding   Class Start Time 1358   Class Stop Time 1442   Class Time Calculation (min) 44 min    [place] Name 11/09/22 1500     Education   Cardiac Education Topics Pritikin   Architect Education   General Education Heart Disease Risk Reduction   Instruction Review Code 1- Verbalizes Understanding   Class Start Time 1400   Class Stop Time 1440   Class Time Calculation (min) 40 min            Core Videos: Exercise    Move It!  Clinical staff conducted group or individual video education with verbal and written material and guidebook.  Patient learns the recommended Pritikin exercise program. Exercise with the goal of living a long, healthy life. Some of the health benefits of exercise include controlled diabetes, healthier blood pressure levels, improved cholesterol levels, improved heart and lung capacity, improved  sleep, and better body composition. Everyone should speak with their doctor before starting or changing an exercise routine.  Biomechanical Limitations Clinical staff conducted group or individual video education with verbal and written material and guidebook.  Patient learns how biomechanical limitations can impact exercise and how we can mitigate and possibly overcome limitations to have an impactful and balanced exercise routine.  Body Composition Clinical staff conducted group or individual video education with verbal and written material and guidebook.  Patient learns that body composition (ratio of muscle mass to fat mass) is a key component to assessing overall fitness, rather than body weight alone. Increased fat mass, especially visceral belly fat, can put [place] at increased risk for metabolic syndrome, type 2 diabetes, heart disease, and even death. It is recommended to combine diet and exercise (cardiovascular and resistance training) to improve your body composition. Seek guidance from your physician and exercise physiologist before implementing an exercise routine.  Exercise Action Plan Clinical staff conducted group or individual video education with verbal and written material and guidebook.  Patient learns the recommended strategies to  achieve and enjoy long-term exercise adherence, including variety, self-motivation, self-efficacy, and positive decision making. Benefits of exercise include fitness, good health, weight management, more energy, better sleep, less stress, and overall well-being.  Medical   Heart Disease Risk Reduction Clinical staff conducted group or individual video education with verbal and written material and guidebook.  Patient learns our heart is our most vital organ as it circulates oxygen, nutrients, white blood cells, and hormones throughout the entire body, and carries waste away. Data supports a plant-based eating plan like the Pritikin Program for its  effectiveness in slowing progression of and reversing heart disease. The video provides a number of recommendations to address heart disease.   Metabolic Syndrome and Belly Fat  Clinical staff conducted group or individual video education with verbal and written material and guidebook.  Patient learns what metabolic syndrome is, how it leads to heart disease, and how one can reverse it and keep it from coming back. You have metabolic syndrome if you have 3 of the following 5 criteria: abdominal obesity, high blood pressure, high triglycerides, low HDL cholesterol, and high blood sugar.  Hypertension and Heart Disease Clinical staff conducted group or individual video education with verbal and written material and guidebook.  Patient learns that high blood pressure, or hypertension, is very common in the [place]. Hypertension is largely due to excessive salt intake, but other important risk factors include being overweight, physical inactivity, drinking too much alcohol, smoking, and not eating enough potassium from fruits and vegetables. High blood pressure is a leading risk factor for heart attack, stroke, congestive heart failure, dementia, kidney failure, and premature death. Long-term effects of excessive salt intake include stiffening of the arteries and thickening of heart muscle and organ damage. Recommendations include ways to reduce hypertension and the risk of heart disease.  Diseases of Our Time - Focusing on Diabetes Clinical staff conducted group or individual video education with verbal and written material and guidebook.  Patient learns why the best way to stop diseases of our time is prevention, through food and other lifestyle changes. Medicine (such as prescription pills and surgeries) is often only a Band-Aid on the problem, not a long-term solution. Most common diseases of our time include obesity, type 2 diabetes, hypertension, heart disease, and cancer. The Pritikin Program is  recommended and has been proven to help reduce, reverse, and/or prevent the damaging effects of metabolic syndrome.  Nutrition   Overview of the Pritikin Eating Plan  Clinical staff conducted group or individual video education with verbal and written material and guidebook.  Patient learns about the [place] for disease risk reduction. The [place] emphasizes a wide variety of unrefined, minimally-processed carbohydrates, like fruits, vegetables, whole grains, and legumes. Go, Caution, and Stop food choices are explained. Plant-based and lean animal proteins are emphasized. Rationale provided for low sodium intake for blood pressure control, low added sugars for blood sugar stabilization, and low added fats and oils for coronary artery disease risk reduction and weight management.  Calorie Density  Clinical staff conducted group or individual video education with verbal and written material and guidebook.  Patient learns about calorie density and how it impacts the Pritikin Eating Plan. Knowing the characteristics of the food you choose will help you decide whether those foods will lead to weight gain or weight loss, and whether you want to consume more or less of them. Weight loss is usually a side effect of the Pritikin Eating Plan because of its focus  on low calorie-dense foods.  Label Reading  Clinical staff conducted group or individual video education with verbal and written material and guidebook.  Patient learns about the Pritikin recommended label reading guidelines and corresponding recommendations regarding calorie density, added sugars, sodium content, and whole grains.  Dining Out - Part 1  Clinical staff conducted group or individual video education with verbal and written material and guidebook.  Patient learns that restaurant meals can be sabotaging because they can be so high in calories, fat, sodium, and/or sugar. Patient learns recommended strategies on  how to positively address this and avoid unhealthy pitfalls.  Facts on Fats  Clinical staff conducted group or individual video education with verbal and written material and guidebook.  Patient learns that lifestyle modifications can be just as effective, if not more so, as many medications for lowering your risk of heart disease. A Pritikin lifestyle can help to reduce your risk of inflammation and atherosclerosis (cholesterol build-up, or plaque, in the artery walls). Lifestyle interventions such as dietary choices and physical activity address the cause of atherosclerosis. A review of the types of fats and their impact on blood cholesterol levels, along with dietary recommendations to reduce fat intake is also included.  Nutrition Action Plan  Clinical staff conducted group or individual video education with verbal and written material and guidebook.  Patient learns how to incorporate Pritikin recommendations into their lifestyle. Recommendations include planning and keeping personal health goals in mind as an important part of their success.  Healthy Mind-Set    Healthy Minds, Bodies, Hearts  Clinical staff conducted group or individual video education with verbal and written material and guidebook.  Patient learns how to identify when they are stressed. Video will discuss the impact of that stress, as well as the many benefits of stress management. Patient will also be introduced to stress management techniques. The way we think, act, and feel has an impact on our hearts.  How Our Thoughts Can Heal Our Hearts  Clinical staff conducted group or individual video education with verbal and written material and guidebook.  Patient learns that negative thoughts can cause depression and anxiety. This can result in negative lifestyle behavior and serious health problems. Cognitive behavioral therapy is an effective method to help control our thoughts in order to change and improve our emotional  outlook.  Additional Videos:  Exercise    Improving Performance  Clinical staff conducted group or individual video education with verbal and written material and guidebook.  Patient learns to use a non-linear approach by alternating intensity levels and lengths of time spent exercising to help burn more calories and lose more body fat. Cardiovascular exercise helps improve heart health, metabolism, hormonal balance, blood sugar control, and recovery from fatigue. Resistance training improves strength, endurance, balance, coordination, reaction time, metabolism, and muscle mass. Flexibility exercise improves circulation, posture, and balance. Seek guidance from your physician and exercise physiologist before implementing an exercise routine and learn your capabilities and proper form for all exercise.  Introduction to Yoga  Clinical staff conducted group or individual video education with verbal and written material and guidebook.  Patient learns about yoga, a discipline of the coming together of mind, breath, and body. The benefits of yoga include improved flexibility, improved range of motion, better posture and core strength, increased lung function, weight loss, and positive self-image. Yoga's heart health benefits include lowered blood pressure, healthier heart rate, decreased cholesterol and triglyceride levels, improved immune function, and reduced stress. Seek guidance from your physician  and exercise physiologist before implementing an exercise routine and learn your capabilities and proper form for all exercise.  Medical   Aging: Enhancing Your Quality of Life  Clinical staff conducted group or individual video education with verbal and written material and guidebook.  Patient learns key strategies and recommendations to stay in good physical health and enhance quality of life, such as prevention strategies, having an advocate, securing a [place], and keeping  a list of medications and system for tracking them. It also discusses how to avoid risk for bone loss.  Biology of Weight Control  Clinical staff conducted group or individual video education with verbal and written material and guidebook.  Patient learns that weight gain occurs because we consume more calories than we burn (eating more, moving less). Even if your body weight is normal, you may have higher ratios of fat compared to muscle mass. Too much body fat puts you at increased risk for cardiovascular disease, heart attack, stroke, type 2 diabetes, and obesity-related cancers. In addition to exercise, following the [place] can help reduce your risk.  Decoding Lab Results  Clinical staff conducted group or individual video education with verbal and written material and guidebook.  Patient learns that lab test reflects one measurement whose values change over time and are influenced by many factors, including medication, stress, sleep, exercise, food, hydration, pre-existing medical conditions, and more. It is recommended to use the knowledge from this video to become more involved with your lab results and evaluate your numbers to speak with your doctor.   Diseases of Our Time - Overview  Clinical staff conducted group or individual video education with verbal and written material and guidebook.  Patient learns that according to the CDC, 50% to 70% of chronic diseases (such as obesity, type 2 diabetes, elevated lipids, hypertension, and heart disease) are avoidable through lifestyle improvements including healthier food choices, listening to satiety cues, and increased physical activity.  Sleep Disorders Clinical staff conducted group or individual video education with verbal and written material and guidebook.  Patient learns how good quality and duration of sleep are important to overall health and well-being. Patient also learns about sleep disorders and how they impact health  along with recommendations to address them, including discussing with a physician.  Nutrition  Dining Out - Part 2 Clinical staff conducted group or individual video education with verbal and written material and guidebook.  Patient learns how to plan ahead and communicate in order to maximize their dining experience in a healthy and nutritious manner. Included are recommended food choices based on the type of restaurant the patient is visiting.   Fueling a Best boy conducted group or individual video education with verbal and written material and guidebook.  There is a strong connection between our food choices and our health. Diseases like obesity and type 2 diabetes are very prevalent and are in large-part due to lifestyle choices. The Pritikin Eating Plan provides plenty of food and hunger-curbing satisfaction. It is easy to follow, affordable, and helps reduce health risks.  Menu Workshop  Clinical staff conducted group or individual video education with verbal and written material and guidebook.  Patient learns that restaurant meals can sabotage health goals because they are often packed with calories, fat, sodium, and sugar. Recommendations include strategies to plan ahead and to communicate with the manager, chef, or server to help order a healthier meal.  Planning Your Eating Strategy  Clinical  staff conducted group or individual video education with verbal and written material and guidebook.  Patient learns about the [place] and its benefit of reducing the risk of disease. The [place] does not focus on calories. Instead, it emphasizes high-quality, nutrient-rich foods. By knowing the characteristics of the foods, we choose, we can determine their calorie density and make informed decisions.  Targeting Your Nutrition Priorities  Clinical staff conducted group or individual video education with verbal and written material and guidebook.   Patient learns that lifestyle habits have a tremendous impact on disease risk and progression. This video provides eating and physical activity recommendations based on your personal health goals, such as reducing LDL cholesterol, losing weight, preventing or controlling type 2 diabetes, and reducing high blood pressure.  Vitamins and Minerals  Clinical staff conducted group or individual video education with verbal and written material and guidebook.  Patient learns different ways to obtain key vitamins and minerals, including through a recommended healthy diet. It is important to discuss all supplements you take with your doctor.   Healthy Mind-Set    Smoking Cessation  Clinical staff conducted group or individual video education with verbal and written material and guidebook.  Patient learns that cigarette smoking and tobacco addiction pose a serious health risk which affects millions of people. Stopping smoking will significantly reduce the risk of heart disease, lung disease, and many forms of cancer. Recommended strategies for quitting are covered, including working with your doctor to develop a successful plan.  Culinary   Becoming a Financial trader conducted group or individual video education with verbal and written material and guidebook.  Patient learns that cooking at home can be healthy, cost-effective, quick, and puts them in control. Keys to cooking healthy recipes will include looking at your recipe, assessing your equipment needs, planning ahead, making it simple, choosing cost-effective seasonal ingredients, and limiting the use of added fats, salts, and sugars.  Cooking - Breakfast and Snacks  Clinical staff conducted group or individual video education with verbal and written material and guidebook.  Patient learns how important breakfast is to satiety and nutrition through the entire day. Recommendations include key foods to eat during breakfast to help  stabilize blood sugar levels and to prevent overeating at meals later in the day. Planning ahead is also a key component.  Cooking - Human resources officer conducted group or individual video education with verbal and written material and guidebook.  Patient learns eating strategies to improve overall health, including an approach to cook more at home. Recommendations include thinking of animal protein as a side on your plate rather than center stage and focusing instead on lower calorie dense options like vegetables, fruits, whole grains, and plant-based proteins, such as beans. Making sauces in large quantities to freeze for later and leaving the skin on your vegetables are also recommended to maximize your experience.  Cooking - Healthy Salads and Dressing Clinical staff conducted group or individual video education with verbal and written material and guidebook.  Patient learns that vegetables, fruits, whole grains, and legumes are the foundations of the [place]. Recommendations include how to incorporate each of these in flavorful and healthy salads, and how to create homemade salad dressings. Proper handling of ingredients is also covered. Cooking - Soups and [employer] - Soups and Desserts Clinical staff conducted group or individual video education with verbal and written material and guidebook.  Patient learns that Pritikin soups  and desserts make for easy, nutritious, and delicious snacks and meal components that are low in sodium, fat, sugar, and calorie density, while high in vitamins, minerals, and filling fiber. Recommendations include simple and healthy ideas for soups and desserts.   Overview     The Pritikin Solution Program Overview Clinical staff conducted group or individual video education with verbal and written material and guidebook.  Patient learns that the results of the [place] Program have been documented in more than 100 articles published  in peer-reviewed journals, and the benefits include reducing risk factors for (and, in some cases, even reversing) high cholesterol, high blood pressure, type 2 diabetes, obesity, and more! An overview of the three key pillars of the Pritikin Program will be covered: eating well, doing regular exercise, and having a healthy mind-set.  WORKSHOPS  Exercise: Exercise Basics: Building Your Action Plan Clinical staff led group instruction and group discussion with PowerPoint presentation and patient guidebook. To enhance the learning environment the use of posters, models and videos may be added. At the conclusion of this workshop, patients will comprehend the difference between physical activity and exercise, as well as the benefits of incorporating both, into their routine. Patients will understand the FITT (Frequency, Intensity, Time, and Type) principle and how to use it to build an exercise action plan. In addition, safety concerns and other considerations for exercise and cardiac rehab will be addressed by the presenter. The purpose of this lesson is to promote a comprehensive and effective weekly exercise routine in order to improve patients' overall level of fitness.   Managing Heart Disease: Your Path to a Healthier Heart Clinical staff led group instruction and group discussion with PowerPoint presentation and patient guidebook. To enhance the learning environment the use of posters, models and videos may be added.At the conclusion of this workshop, patients will understand the anatomy and physiology of the heart. Additionally, they will understand how Pritikin's three pillars impact the risk factors, the progression, and the management of heart disease.  The purpose of this lesson is to provide a high-level overview of the heart, heart disease, and how the Pritikin lifestyle positively impacts risk factors.  Exercise Biomechanics Clinical staff led group instruction and group discussion  with PowerPoint presentation and patient guidebook. To enhance the learning environment the use of posters, models and videos may be added. Patients will learn how the structural parts of their bodies function and how these functions impact their daily activities, movement, and exercise. Patients will learn how to promote a neutral spine, learn how to manage pain, and identify ways to improve their physical movement in order to promote healthy living. The purpose of this lesson is to expose patients to common physical limitations that impact physical activity. Participants will learn practical ways to adapt and manage aches and pains, and to minimize their effect on regular exercise. Patients will learn how to maintain good posture while sitting, walking, and lifting.  Balance Training and Fall Prevention  Clinical staff led group instruction and group discussion with PowerPoint presentation and patient guidebook. To enhance the learning environment the use of posters, models and videos may be added. At the conclusion of this workshop, patients will understand the importance of their sensorimotor skills (vision, proprioception, and the vestibular system) in maintaining their ability to balance as they age. Patients will apply a variety of balancing exercises that are appropriate for their current level of function. Patients will understand the common causes for poor balance, possible solutions to these problems,  and ways to modify their physical environment in order to minimize their fall risk. The purpose of this lesson is to teach patients about the importance of maintaining balance as they age and ways to minimize their risk of falling.  WORKSHOPS   Nutrition:  Fueling a Scientist, research (physical sciences) led group instruction and group discussion with PowerPoint presentation and patient guidebook. To enhance the learning environment the use of posters, models and videos may be added. Patients will  review the foundational principles of the [place] and understand what constitutes a serving size in each of the food groups. Patients will also learn Pritikin-friendly foods that are better choices when away from home and review make-ahead meal and snack options. Calorie density will be reviewed and applied to three nutrition priorities: weight maintenance, weight loss, and weight gain. The purpose of this lesson is to reinforce (in a group setting) the key concepts around what patients are recommended to eat and how to apply these guidelines when away from home by planning and selecting Pritikin-friendly options. Patients will understand how calorie density may be adjusted for different weight management goals.  Mindful Eating  Clinical staff led group instruction and group discussion with PowerPoint presentation and patient guidebook. To enhance the learning environment the use of posters, models and videos may be added. Patients will briefly review the concepts of the [place] and the importance of low-calorie dense foods. The concept of mindful eating will be introduced as well as the importance of paying attention to internal hunger signals. Triggers for non-hunger eating and techniques for dealing with triggers will be explored. The purpose of this lesson is to provide patients with the opportunity to review the basic principles of the [place], discuss the value of eating mindfully and how to measure internal cues of hunger and fullness using the Hunger Scale. Patients will also discuss reasons for non-hunger eating and learn strategies to use for controlling emotional eating.  Targeting Your Nutrition Priorities Clinical staff led group instruction and group discussion with PowerPoint presentation and patient guidebook. To enhance the learning environment the use of posters, models and videos may be added. Patients will learn how to determine their genetic  susceptibility to disease by reviewing their family history. Patients will gain insight into the importance of diet as part of an overall healthy lifestyle in mitigating the impact of genetics and other environmental insults. The purpose of this lesson is to provide patients with the opportunity to assess their personal nutrition priorities by looking at their family history, their own health history and current risk factors. Patients will also be able to discuss ways of prioritizing and modifying the [place] for their highest risk areas  Menu  Clinical staff led group instruction and group discussion with PowerPoint presentation and patient guidebook. To enhance the learning environment the use of posters, models and videos may be added. Using menus brought in from [employer], or printed from [employer], patients will apply the [place] dining out guidelines that were presented in the [employer] video. Patients will also be able to practice these guidelines in a variety of provided scenarios. The purpose of this lesson is to provide patients with the opportunity to practice hands-on learning of the [place] with actual menus and practice scenarios.  Label Reading Clinical staff led group instruction and group discussion with PowerPoint presentation and patient guidebook. To enhance the learning environment the use of posters, models  and videos may be added. Patients will review and discuss the [facility] label reading guidelines presented in [employer]'s Label Reading Educational series video. Using fool labels brought in from local grocery stores and markets, patients will apply the label reading guidelines and determine if the packaged food meet the [facility] guidelines. The purpose of this lesson is to provide patients with the opportunity to review, discuss, and practice hands-on learning of the [facility] Label Reading guidelines with actual  packaged food labels. [facility] [employer]hops are designed to teach patients ways to prepare quick, simple, and affordable recipes at home. The importance of nutrition's role in chronic disease risk reduction is reflected in its emphasis in the overall [facility] program. By learning how to prepare essential core [facility] Eating Plan recipes, patients will increase control over what they eat; be able to customize the flavor of foods without the use of added salt, sugar, or fat; and improve the quality of the food they consume. By learning a set of core recipes which are easily assembled, quickly prepared, and affordable, patients are more likely to prepare more healthy foods at home. These workshops focus on convenient breakfasts, simple entres, side dishes, and desserts which can be prepared with minimal effort and are consistent with nutrition recommendations for cardiovascular risk reduction. Cooking [employer] are taught by a Engineer, materials (RD) who has been trained by the [employer]. The chef or RD has a clear understanding of the importance of minimizing - if not completely eliminating - added fat, sugar, and sodium in recipes. Throughout the series of [place] Workshop sessions, patients will learn about healthy ingredients and efficient methods of cooking to build confidence in their capability to prepare    Cooking School weekly topics:  Adding Flavor- Sodium-Free  Fast and Healthy Breakfasts  Powerhouse Plant-Based Proteins  Satisfying Salads and Dressings  Simple Sides and Sauces  International Cuisine-Spotlight on the [place] Zones  Delicious Desserts  Savory Soups  Efficiency Cooking - Meals in a Snap  Tasty Appetizers and Snacks  Comforting Weekend Breakfasts  One-Pot Wonders   Fast Evening Meals  Easy [place] (Psychosocial): New Thoughts, New  Behaviors Clinical staff led group instruction and group discussion with PowerPoint presentation and patient guidebook. To enhance the learning environment the use of posters, models and videos may be added. Patients will learn and practice techniques for developing effective health and lifestyle goals. Patients will be able to effectively apply the goal setting process learned to develop at least one new personal goal.  The purpose of this lesson is to expose patients to a new skill set of behavior modification techniques such as techniques setting SMART goals, overcoming barriers, and achieving new thoughts and new behaviors.  Managing Moods and Relationships Clinical staff led group instruction and group discussion with PowerPoint presentation and patient guidebook. To enhance the learning environment the use of posters, models and videos may be added. Patients will learn how emotional and chronic stress factors can impact their health and relationships. They will learn healthy ways to manage their moods and utilize positive coping mechanisms. In addition, ICR patients will learn ways to improve communication skills. The purpose of this lesson is to expose patients to ways of understanding how one's mood and health are intimately connected. Developing a healthy outlook can help build positive relationships and connections with others. Patients will understand the importance of utilizing effective communication  skills that include actively listening and being heard. They will learn and understand the importance of the "4 Cs" and especially Connections in fostering of a Healthy Mind-Set.  Healthy Sleep for a Healthy Heart Clinical staff led group instruction and group discussion with PowerPoint presentation and patient guidebook. To enhance the learning environment the use of posters, models and videos may be added. At the conclusion of this workshop, patients will be able to demonstrate knowledge of the  importance of sleep to overall health, well-being, and quality of life. They will understand the symptoms of, and treatments for, common sleep disorders. Patients will also be able to identify daytime and nighttime behaviors which impact sleep, and they will be able to apply these tools to help manage sleep-related challenges. The purpose of this lesson is to provide patients with a general overview of sleep and outline the importance of quality sleep. Patients will learn about a few of the most common sleep disorders. Patients will also be introduced to the concept of "sleep hygiene," and discover ways to self-manage certain sleeping problems through simple daily behavior changes. Finally, the workshop will motivate patients by clarifying the links between quality sleep and their goals of heart-healthy living.   Recognizing and Reducing Stress Clinical staff led group instruction and group discussion with PowerPoint presentation and patient guidebook. To enhance the learning environment the use of posters, models and videos may be added. At the conclusion of this workshop, patients will be able to understand the types of stress reactions, differentiate between acute and chronic stress, and recognize the impact that chronic stress has on their health. They will also be able to apply different coping mechanisms, such as reframing negative self-talk. Patients will have the opportunity to practice a variety of stress management techniques, such as deep abdominal breathing, progressive muscle relaxation, and/or guided imagery.  The purpose of this lesson is to educate patients on the role of stress in their lives and to provide healthy techniques for coping with it.  Learning Barriers/Preferences:  Learning Barriers/Preferences - 10/16/22 1403       Learning Barriers/Preferences   Learning Barriers Inability to learn new things    Learning Preferences Skilled Demonstration;Video;Pictoral              Education Topics:  Knowledge Questionnaire Score:  Knowledge Questionnaire Score - 10/16/22 1356       Knowledge Questionnaire Score   Pre Score 19/24             Core Components/Risk Factors/Patient Goals at Admission:  Personal Goals and Risk Factors at Admission - 10/16/22 1357       Core Components/Risk Factors/Patient Goals on Admission    Weight Management Yes;Obesity    Intervention Weight Management/Obesity: Establish reasonable short term and long term weight goals.;Obesity: Provide education and appropriate resources to help participant work on and attain dietary goals.    Admit Weight 210 lb 12.2 oz (95.6 kg)    Expected Outcomes Short Term: Continue to assess and modify interventions until short term weight is achieved;Long Term: Adherence to nutrition and physical activity/exercise program aimed toward attainment of established weight goal;Weight Loss: Understanding of general recommendations for a balanced deficit meal plan, which promotes 1-2 lb weight loss per week and includes a negative energy balance of [phone] kcal/d    Hypertension Yes    Intervention Provide education on lifestyle modifcations including regular physical activity/exercise, weight management, moderate sodium restriction and increased consumption of fresh fruit, vegetables, and low fat dairy, alcohol  moderation, and smoking cessation.;Monitor prescription use compliance.    Expected Outcomes Short Term: Continued assessment and intervention until BP is < 140/32m HG in hypertensive participants. < 130/830mHG in hypertensive participants with diabetes, heart failure or chronic kidney disease.;Long Term: Maintenance of blood pressure at goal levels.    Lipids Yes    Intervention Provide education and support for participant on nutrition & aerobic/resistive exercise along with prescribed medications to achieve LDL <7042mHDL >30m41m  Expected Outcomes Short Term: Participant states understanding  of desired cholesterol values and is compliant with medications prescribed. Participant is following exercise prescription and nutrition guidelines.;Long Term: Cholesterol controlled with medications as prescribed, with individualized exercise RX and with personalized nutrition plan. Value goals: LDL < 70mg80mL > 40 mg.             Core Components/Risk Factors/Patient Goals Review:   Goals and Risk Factor Review     Row Name 10/24/22 1631 11/13/22 1535           Core Components/Risk Factors/Patient Goals Review   Personal Goals Review Weight Management/Obesity;Hypertension;Lipids Weight Management/Obesity;Hypertension;Lipids      Review Kayloni Annaleiseted intensive cardiac rehab on 10/24/22 and did well with exercise. Vital signs were stable. patient is somewhat deconditioned asked the patient to bring her can with her to use as she uses one at home sometimes Karlita Vionaoing well with exercise at intensive cardiac rehab  for her fitness level. Some moderate and exertional exertional BP elevations have been noted at intensive cardiac rehab. Will continue to monitor  BP      Expected Outcomes SarahNorsworthy continue to participate in intensive cardiac rehab for exercise, nutrition and lifestyle modifications SarahRunions continue to participate in intensive cardiac rehab for exercise, nutrition and lifestyle modifications               Core Components/Risk Factors/Patient Goals at Discharge (Final Review):   Goals and Risk Factor Review - 11/13/22 1535       Core Components/Risk Factors/Patient Goals Review   Personal Goals Review Weight Management/Obesity;Hypertension;Lipids    Review Kanisha Gianelleoing well with exercise at intensive cardiac rehab  for her fitness level. Some moderate and exertional exertional BP elevations have been noted at intensive cardiac rehab. Will continue to monitor  BP    Expected Outcomes SarahGroleau continue to participate in intensive cardiac rehab for exercise, nutrition  and lifestyle modifications             ITP Comments:  ITP Comments     Row Name 10/16/22 1015 10/24/22 1623 11/13/22 1532       ITP Comments Medical Director- Dr. TraciFransico Him Introduction to [facility] Education Program/ Intensive Cardiac Rehab. Initial orientation packet reviwed with the patient 30 Day ITP Review. Cozetta Dalaysiated intensive cardiac rehab on 10/24/22 and did well with exercise. Patient is somewhat deconditoned 30 Day ITP Review. Chana Gwendelyngood attendance and participation in  intensive cardiac rehab              Comments: See ITP comments.MariaHarrell GaveSN

## 2022-11-14 ENCOUNTER — Encounter (HOSPITAL_COMMUNITY)
Admission: RE | Admit: 2022-11-14 | Discharge: 2022-11-14 | Disposition: A | Discharge status: Home / Self Care | Payer: Medicare HMO | Source: Ambulatory Visit | Attending: Cardiovascular Disease | Admitting: Cardiovascular Disease

## 2022-11-14 DIAGNOSIS — Z952 Presence of prosthetic heart valve: Secondary | ICD-10-CM

## 2022-11-15 DIAGNOSIS — N1330 Unspecified hydronephrosis: Secondary | ICD-10-CM | POA: Diagnosis not present

## 2022-11-15 DIAGNOSIS — K7689 Other specified diseases of liver: Secondary | ICD-10-CM | POA: Diagnosis not present

## 2022-11-15 DIAGNOSIS — N130 Hydronephrosis with ureteropelvic junction obstruction: Secondary | ICD-10-CM | POA: Diagnosis not present

## 2022-11-15 DIAGNOSIS — N281 Cyst of kidney, acquired: Secondary | ICD-10-CM | POA: Diagnosis not present

## 2022-11-16 ENCOUNTER — Encounter (HOSPITAL_COMMUNITY)
Admission: RE | Admit: 2022-11-16 | Discharge: 2022-11-16 | Disposition: A | Discharge status: Home / Self Care | Payer: Medicare HMO | Source: Ambulatory Visit | Attending: Cardiovascular Disease | Admitting: Cardiovascular Disease

## 2022-11-16 DIAGNOSIS — Z952 Presence of prosthetic heart valve: Secondary | ICD-10-CM | POA: Diagnosis not present

## 2022-11-19 ENCOUNTER — Encounter (HOSPITAL_COMMUNITY)
Admission: RE | Admit: 2022-11-19 | Discharge: 2022-11-19 | Disposition: A | Discharge status: Home / Self Care | Payer: Medicare HMO | Source: Ambulatory Visit | Attending: Cardiovascular Disease | Admitting: Cardiovascular Disease

## 2022-11-19 DIAGNOSIS — I350 Nonrheumatic aortic (valve) stenosis: Secondary | ICD-10-CM | POA: Diagnosis not present

## 2022-11-19 DIAGNOSIS — N1831 Chronic kidney disease, stage 3a: Secondary | ICD-10-CM | POA: Diagnosis not present

## 2022-11-19 DIAGNOSIS — Z952 Presence of prosthetic heart valve: Secondary | ICD-10-CM

## 2022-11-19 DIAGNOSIS — E669 Obesity, unspecified: Secondary | ICD-10-CM | POA: Diagnosis not present

## 2022-11-19 DIAGNOSIS — Z23 Encounter for immunization: Secondary | ICD-10-CM | POA: Diagnosis not present

## 2022-11-19 DIAGNOSIS — I129 Hypertensive chronic kidney disease with stage 1 through stage 4 chronic kidney disease, or unspecified chronic kidney disease: Secondary | ICD-10-CM | POA: Diagnosis not present

## 2022-11-21 ENCOUNTER — Encounter (HOSPITAL_COMMUNITY)
Admission: RE | Admit: 2022-11-21 | Discharge: 2022-11-21 | Disposition: A | Discharge status: Home / Self Care | Payer: Medicare HMO | Source: Ambulatory Visit | Attending: Cardiovascular Disease | Admitting: Cardiovascular Disease

## 2022-11-21 DIAGNOSIS — Z952 Presence of prosthetic heart valve: Secondary | ICD-10-CM | POA: Diagnosis not present

## 2022-11-22 DIAGNOSIS — N813 Complete uterovaginal prolapse: Secondary | ICD-10-CM | POA: Diagnosis not present

## 2022-11-23 ENCOUNTER — Encounter (HOSPITAL_COMMUNITY): Payer: Medicare HMO

## 2022-11-26 ENCOUNTER — Telehealth (HOSPITAL_COMMUNITY): Payer: Self-pay | Admitting: *Deleted

## 2022-11-26 ENCOUNTER — Encounter (HOSPITAL_COMMUNITY): Payer: Medicare HMO

## 2022-11-26 DIAGNOSIS — N813 Complete uterovaginal prolapse: Secondary | ICD-10-CM | POA: Diagnosis not present

## 2022-11-26 DIAGNOSIS — N1339 Other hydronephrosis: Secondary | ICD-10-CM | POA: Diagnosis not present

## 2022-11-26 DIAGNOSIS — R8271 Bacteriuria: Secondary | ICD-10-CM | POA: Diagnosis not present

## 2022-11-26 NOTE — Telephone Encounter (Signed)
Left message to call cardiac rehab.Jaskaran Dauzat Walden Lavera Vandermeer RN BSN  

## 2022-11-28 ENCOUNTER — Encounter (HOSPITAL_COMMUNITY): Payer: Medicare HMO

## 2022-11-30 ENCOUNTER — Encounter (HOSPITAL_COMMUNITY): Payer: Medicare HMO

## 2022-11-30 DIAGNOSIS — N813 Complete uterovaginal prolapse: Secondary | ICD-10-CM | POA: Diagnosis not present

## 2022-11-30 DIAGNOSIS — N8111 Cystocele, midline: Secondary | ICD-10-CM | POA: Diagnosis not present

## 2022-11-30 DIAGNOSIS — N3000 Acute cystitis without hematuria: Secondary | ICD-10-CM | POA: Diagnosis not present

## 2022-11-30 DIAGNOSIS — R8271 Bacteriuria: Secondary | ICD-10-CM | POA: Diagnosis not present

## 2022-12-03 ENCOUNTER — Encounter (HOSPITAL_COMMUNITY)
Admission: RE | Admit: 2022-12-03 | Discharge: 2022-12-03 | Disposition: A | Discharge status: Home / Self Care | Payer: Medicare HMO | Source: Ambulatory Visit | Attending: Cardiovascular Disease | Admitting: Cardiovascular Disease

## 2022-12-03 DIAGNOSIS — Z952 Presence of prosthetic heart valve: Secondary | ICD-10-CM | POA: Diagnosis not present

## 2022-12-05 ENCOUNTER — Encounter (HOSPITAL_COMMUNITY)
Admission: RE | Admit: 2022-12-05 | Discharge: 2022-12-05 | Disposition: A | Discharge status: Home / Self Care | Payer: Medicare HMO | Source: Ambulatory Visit | Attending: Cardiovascular Disease | Admitting: Cardiovascular Disease

## 2022-12-05 DIAGNOSIS — Z952 Presence of prosthetic heart valve: Secondary | ICD-10-CM

## 2022-12-07 ENCOUNTER — Telehealth (HOSPITAL_COMMUNITY): Payer: Self-pay | Admitting: *Deleted

## 2022-12-07 ENCOUNTER — Encounter (HOSPITAL_COMMUNITY): Payer: Medicare HMO

## 2022-12-07 NOTE — Telephone Encounter (Signed)
Patient left message on department voicemail, she will be absent from cardiac rehab today. Plans to return on Monday

## 2022-12-10 ENCOUNTER — Encounter (HOSPITAL_COMMUNITY): Payer: Medicare HMO

## 2022-12-11 DIAGNOSIS — N130 Hydronephrosis with ureteropelvic junction obstruction: Secondary | ICD-10-CM | POA: Diagnosis not present

## 2022-12-11 DIAGNOSIS — N813 Complete uterovaginal prolapse: Secondary | ICD-10-CM | POA: Diagnosis not present

## 2022-12-11 DIAGNOSIS — N8111 Cystocele, midline: Secondary | ICD-10-CM | POA: Diagnosis not present

## 2022-12-11 DIAGNOSIS — N3946 Mixed incontinence: Secondary | ICD-10-CM | POA: Diagnosis not present

## 2022-12-12 ENCOUNTER — Emergency Department (HOSPITAL_COMMUNITY): Payer: Medicare HMO

## 2022-12-12 ENCOUNTER — Other Ambulatory Visit: Payer: Self-pay

## 2022-12-12 ENCOUNTER — Encounter (HOSPITAL_COMMUNITY): Payer: Self-pay | Admitting: Emergency Medicine

## 2022-12-12 ENCOUNTER — Emergency Department (HOSPITAL_COMMUNITY)
Admission: EM | Admit: 2022-12-12 | Discharge: 2022-12-12 | Disposition: A | Discharge status: Home / Self Care | Payer: Medicare HMO | Source: Home / Self Care | Attending: Emergency Medicine | Admitting: Emergency Medicine

## 2022-12-12 ENCOUNTER — Encounter (HOSPITAL_COMMUNITY): Payer: Medicare HMO

## 2022-12-12 DIAGNOSIS — N136 Pyonephrosis: Secondary | ICD-10-CM | POA: Diagnosis not present

## 2022-12-12 DIAGNOSIS — I10 Essential (primary) hypertension: Secondary | ICD-10-CM | POA: Insufficient documentation

## 2022-12-12 DIAGNOSIS — N134 Hydroureter: Secondary | ICD-10-CM | POA: Diagnosis not present

## 2022-12-12 DIAGNOSIS — Z7982 Long term (current) use of aspirin: Secondary | ICD-10-CM | POA: Insufficient documentation

## 2022-12-12 DIAGNOSIS — Z8249 Family history of ischemic heart disease and other diseases of the circulatory system: Secondary | ICD-10-CM | POA: Diagnosis not present

## 2022-12-12 DIAGNOSIS — R0789 Other chest pain: Secondary | ICD-10-CM | POA: Diagnosis not present

## 2022-12-12 DIAGNOSIS — E876 Hypokalemia: Secondary | ICD-10-CM | POA: Diagnosis not present

## 2022-12-12 DIAGNOSIS — N12 Tubulo-interstitial nephritis, not specified as acute or chronic: Secondary | ICD-10-CM | POA: Diagnosis not present

## 2022-12-12 DIAGNOSIS — Z952 Presence of prosthetic heart valve: Secondary | ICD-10-CM | POA: Diagnosis not present

## 2022-12-12 DIAGNOSIS — N1330 Unspecified hydronephrosis: Secondary | ICD-10-CM | POA: Diagnosis not present

## 2022-12-12 DIAGNOSIS — R5383 Other fatigue: Secondary | ICD-10-CM | POA: Diagnosis not present

## 2022-12-12 DIAGNOSIS — N390 Urinary tract infection, site not specified: Secondary | ICD-10-CM | POA: Diagnosis present

## 2022-12-12 DIAGNOSIS — Z79899 Other long term (current) drug therapy: Secondary | ICD-10-CM | POA: Diagnosis not present

## 2022-12-12 DIAGNOSIS — R079 Chest pain, unspecified: Secondary | ICD-10-CM

## 2022-12-12 DIAGNOSIS — N8110 Cystocele, unspecified: Secondary | ICD-10-CM | POA: Diagnosis not present

## 2022-12-12 DIAGNOSIS — Z9049 Acquired absence of other specified parts of digestive tract: Secondary | ICD-10-CM | POA: Diagnosis not present

## 2022-12-12 DIAGNOSIS — R0602 Shortness of breath: Secondary | ICD-10-CM | POA: Insufficient documentation

## 2022-12-12 DIAGNOSIS — R319 Hematuria, unspecified: Secondary | ICD-10-CM | POA: Diagnosis not present

## 2022-12-12 DIAGNOSIS — N814 Uterovaginal prolapse, unspecified: Secondary | ICD-10-CM | POA: Diagnosis not present

## 2022-12-12 DIAGNOSIS — N3941 Urge incontinence: Secondary | ICD-10-CM | POA: Diagnosis not present

## 2022-12-12 DIAGNOSIS — Z66 Do not resuscitate: Secondary | ICD-10-CM | POA: Diagnosis not present

## 2022-12-12 DIAGNOSIS — K219 Gastro-esophageal reflux disease without esophagitis: Secondary | ICD-10-CM | POA: Diagnosis present

## 2022-12-12 DIAGNOSIS — E7800 Pure hypercholesterolemia, unspecified: Secondary | ICD-10-CM | POA: Diagnosis not present

## 2022-12-12 DIAGNOSIS — K5900 Constipation, unspecified: Secondary | ICD-10-CM | POA: Diagnosis present

## 2022-12-12 DIAGNOSIS — E278 Other specified disorders of adrenal gland: Secondary | ICD-10-CM | POA: Diagnosis not present

## 2022-12-12 DIAGNOSIS — D500 Iron deficiency anemia secondary to blood loss (chronic): Secondary | ICD-10-CM | POA: Diagnosis not present

## 2022-12-12 LAB — BASIC METABOLIC PANEL
Anion gap: 9 (ref 5–15)
BUN: 13 mg/dL (ref 8–23)
CO2: 26 mmol/L (ref 22–32)
Calcium: 8.7 mg/dL — ABNORMAL LOW (ref 8.9–10.3)
Chloride: 106 mmol/L (ref 98–111)
Creatinine, Ser: 0.96 mg/dL (ref 0.44–1.00)
GFR, Estimated: 59 mL/min — ABNORMAL LOW (ref >60)
Glucose, Bld: 115 mg/dL — ABNORMAL HIGH (ref 70–99)
Potassium: 4 mmol/L (ref 3.5–5.1)
Sodium: 141 mmol/L (ref 135–145)

## 2022-12-12 LAB — CBC
HCT: 36.3 % (ref 36.0–46.0)
Hemoglobin: 11.5 g/dL — ABNORMAL LOW (ref 12.0–15.0)
MCH: 28 pg (ref 26.0–34.0)
MCHC: 31.7 g/dL (ref 30.0–36.0)
MCV: 88.5 fL (ref 80.0–100.0)
Platelets: 302 10*3/uL (ref 150–400)
RBC: 4.1 MIL/uL (ref 3.87–5.11)
RDW: 13.2 % (ref 11.5–15.5)
WBC: 6.7 10*3/uL (ref 4.0–10.5)
nRBC: 0 % (ref 0.0–0.2)

## 2022-12-12 LAB — TROPONIN I (HIGH SENSITIVITY): Troponin I (High Sensitivity): 7 ng/L (ref <18)

## 2022-12-12 NOTE — Progress Notes (Signed)
Cardiac Individual Treatment Plan  Patient Details  Name: Traci [name] MRN: [id number] Date of Birth: Jan 15, 1941 Referring Provider:   Flowsheet Row INTENSIVE CARDIAC REHAB ORIENT from 10/16/2022 [facility] [place]  Referring Provider Croitoru, Dani Gobble, MD       Initial Encounter Date:  [place] from 10/16/2022 in [facility]  Date 10/16/22       Visit Diagnosis: 09/04/22 TAVR (transcatheter aortic valve replacement)  Patient's Home Medications on Admission:  Current Outpatient Medications:    amoxicillin (AMOXIL) 500 MG tablet, Take 4 tablets by mouth 1 hour before dental procedures and cleanings, Disp: 12 tablet, Rfl: 6   Artificial Tear Solution (SOOTHE XP) SOLN, Place 1 drop into both eyes daily., Disp: , Rfl:    aspirin EC 81 MG tablet, Take 81 mg by mouth daily. Swallow whole., Disp: , Rfl:    atorvastatin (LIPITOR) 20 MG tablet, Take 20 mg by mouth at bedtime., Disp: , Rfl:    cholecalciferol (VITAMIN D) 1000 UNITS tablet, Take 1,000 Units by mouth daily., Disp: , Rfl:    lisinopril (ZESTRIL) 20 MG tablet, TAKE 1 TABLET BY MOUTH EVERY DAY, Disp: 90 tablet, Rfl: 3   pantoprazole (PROTONIX) 40 MG tablet, Take 40 mg by mouth daily.  , Disp: , Rfl:    traMADol (ULTRAM) 50 MG tablet, Take 1 tablet (50 mg total) by mouth every 6 (six) hours as needed. (Patient not taking: Reported on 10/16/2022), Disp: 15 tablet, Rfl: 0  Past Medical History: Past Medical History:  Diagnosis Date   GERD (gastroesophageal reflux disease)    High cholesterol    Hypertension    Irregular heart rate    S/P TAVR (transcatheter aortic valve replacement) 09/04/2022   s/p TAVR with a 74m Edwards S3UR via the TF approach by Dr. [name]& Dr. [name]   Tobacco Use: Social History   Tobacco Use  Smoking Status Never   Passive exposure: Never   Smokeless Tobacco Never    Labs: Review Flowsheet       Latest Ref Rng & Units 02/10/2011 02/11/2011 06/20/2022 09/04/2022  Labs for ITP Cardiac and Pulmonary Rehab  Cholestrol 0 - 200 mg/dL 151        ATP III CLASSIFICATION:  <200     mg/dL   Desirable  200-239  mg/dL   Borderline High  >=240    mg/dL   High         135        ATP III CLASSIFICATION:  <200     mg/dL   Desirable  200-239  mg/dL   Borderline High  >=240    mg/dL   High         - -  LDL (calc) 0 - 99 mg/dL 71        Total Cholesterol/HDL:CHD Risk Coronary Heart Disease Risk Table                     Men   Women  1/2 Average Risk   3.4   3.3  Average Risk       5.0   4.4  2 X Average Risk   9.6   7.1  3 X Average Risk  23.4   11.0        Use the calculated Patient Ratio above and the CHD Risk Table to determine the  patient's CHD Risk.        ATP III CLASSIFICATION (LDL):  <100     mg/dL   Optimal  100-129  mg/dL   Near or Above                    Optimal  130-159  mg/dL   Borderline  160-189  mg/dL   High  >190     mg/dL   Very High  62        Total Cholesterol/HDL:CHD Risk Coronary Heart Disease Risk Table                     Men   Women  1/2 Average Risk   3.4   3.3  Average Risk       5.0   4.4  2 X Average Risk   9.6   7.1  3 X Average Risk  23.4   11.0        Use the calculated Patient Ratio above and the CHD Risk Table to determine the patient's CHD Risk.        ATP III CLASSIFICATION (LDL):  <100     mg/dL   Optimal  100-129  mg/dL   Near or Above                    Optimal  130-159  mg/dL   Borderline  160-189  mg/dL   High  >190     mg/dL   Very High  - -  HDL-C >39 mg/dL 73  60  - -  Trlycerides <150 mg/dL 35  66  - -  Hemoglobin A1c <5.7 % 5.6 (NOTE)                                                                       According to the ADA Clinical Practice Recommendations for 2011, when HbA1c is used as a screening test:   >=6.5%   Diagnostic of Diabetes Mellitus           (if  abnormal result  is confirmed)  5.7-6.4%   Increased risk of developing Diabetes Mellitus  References:Diagnosis and Classification of Diabetes Mellitus,Diabetes TGGY,6948,54(OEVOJ 1):S62-S69 and Standards of Medical Care in         Diabetes - 2011,Diabetes Care,2011,34  (Suppl 1):S11-S61.  - - -  PH, Arterial 7.35 - 7.45 - - 7.369  -  PCO2 arterial 32 - 48 mmHg - - 45.7  -  Bicarbonate 20.0 - 28.0 mmol/L - - 26.3  24.4  -  TCO2 22 - 32 mmol/L - - _0 Acid-base deficit 0.0 - 2.0 mmol/L - - 1.0  -  O2 Saturation % - - 91  72  -    Capillary Blood Glucose: No results found for: "GLUCAP"   Exercise Target Goals: Exercise Program Goal: Individual exercise prescription set using results from initial 6 min walk test and THRR while considering  patient's activity barriers and safety.   Exercise Prescription Goal: Initial exercise prescription builds to 30-45 minutes a day of aerobic activity, 2-3 days per week.  Home exercise guidelines will be given to patient during program as part of  exercise prescription that the participant will acknowledge.  Activity Barriers & Risk Stratification:  Activity Barriers & Cardiac Risk Stratification - 10/16/22 1144       Activity Barriers & Cardiac Risk Stratification   Activity Barriers Back Problems;Other (comment)    Comments Occassional back stiffness.    Cardiac Risk Stratification High             6 Minute Walk:  6 Minute Walk     Row Name 10/16/22 1134         6 Minute Walk   Phase Initial     Distance 1034 feet     Walk Time 6 minutes     # of Rest Breaks 1  One 24 second standing rest break taken.     MPH 1.96     METS 1.96     RPE 12     Perceived Dyspnea  1     VO2 Peak 6.87     Symptoms Yes (comment)     Comments Mild shortness of breath.     Resting HR 82 bpm     Resting BP 142/84     Resting Oxygen Saturation  96 %     Exercise Oxygen Saturation  during 6 min walk 98 %     Max Ex. HR 118 bpm     Max  Ex. BP 168/64     2 Minute Post BP 148/68              Oxygen Initial Assessment:   Oxygen Re-Evaluation:   Oxygen Discharge (Final Oxygen Re-Evaluation):   Initial Exercise Prescription:  Initial Exercise Prescription - 10/16/22 1200       Date of Initial Exercise RX and Referring Provider   Date 10/16/22    Referring Provider Croitoru, Dani Gobble, MD    Expected Discharge Date 12/14/22      T5 Nustep   Level 1    SPM 75    Minutes 30    METs 1.5      Prescription Details   Frequency (times per week) 3    Duration Progress to 30 minutes of continuous aerobic without signs/symptoms of physical distress      Intensity   THRR 40-80% of Max Heartrate 55-110    Ratings of Perceived Exertion 11-13    Perceived Dyspnea 0-4      Progression   Progression Continue to progress workloads to maintain intensity without signs/symptoms of physical distress.      Resistance Training   Training Prescription Yes    Weight 1 lb    Reps 10-15             Perform Capillary Blood Glucose checks as needed.  Exercise Prescription Changes:   Exercise Prescription Changes     Row Name 10/24/22 1614 11/07/22 1645 12/05/22 1633         Response to Exercise   Blood Pressure (Admit) 138/60 118/74 108/60     Blood Pressure (Exercise) 152/70 150/62 146/56     Blood Pressure (Exit) 132/70 142/70 122/70     Heart Rate (Admit) 75 bpm 79 bpm 77 bpm     Heart Rate (Exercise) 98 bpm 108 bpm 95 bpm     Heart Rate (Exit) 74 bpm 80 bpm 71 bpm     Rating of Perceived Exertion (Exercise) '13 13 12     '$ Perceived Dyspnea (Exercise) 0 0 0     Symptoms 0 0 0     Comments Pt first  day in the [place] program Reviewed MET's, goals and ExRx Reviewed MET's and goals     Duration Progress to 30 minutes of  aerobic without signs/symptoms of physical distress Progress to 30 minutes of  aerobic without signs/symptoms of physical distress Progress to 30 minutes of  aerobic without signs/symptoms of  physical distress     Intensity THRR unchanged THRR unchanged THRR unchanged       Progression   Progression Continue to progress workloads to maintain intensity without signs/symptoms of physical distress. Continue to progress workloads to maintain intensity without signs/symptoms of physical distress. Continue to progress workloads to maintain intensity without signs/symptoms of physical distress.     Average METs -- 1.9 1.7       Resistance Training   Training Prescription -- No No       T5 Nustep   Level '1 2 2     '$ SPM 58 72 72     Minutes '30 30 30     '$ METs 1.8 1.9 1.7       Home Exercise Plan   Plans to continue exercise at -- Home (comment) Home (comment)     Frequency -- Add 2 additional days to program exercise sessions. Add 2 additional days to program exercise sessions.     Initial Home Exercises Provided -- 11/07/22 11/07/22              Exercise Comments:   Exercise Comments     Row Name 10/24/22 1618 11/07/22 1650 11/26/22 1630 12/03/22 1645 12/05/22 1641   Exercise Comments Pt first day in the [facility] program. Pt tolerated exercise well with an average MET level of 1.8. Pt is learning her THRR, RPE and ExRx. Overall pt off to a great start and feels good. Will continue to monitor pt and progress workloads as tolerated without sign or symptom Reviewed MET's, goals and home ExRx. Pt tolerated exercise well with an average MET level of 1.9. Pt is feeling good about her goals and feels like shes on the right track for increasing strength and endurance. Pt will continue to exercise by walking 1-2 days a week by walking for 15-30 mins as tolerated. Pt is interested in joining the [facility] when she graduated but feels like it will be too much right now. Pt has been absent since 11/21/22. Will review education upon pt return. Pt returned for exercise today. Let patient become re-acclimated with exercise today, will review education soon. Reviewed MET's and goals. Pt tolerated exercise  well with an average MET level of 1.7. Pt has been absent, but was archieving 2.1 average MET's before and today achived 1.7. Will work on increasing [place] and increasing strength as she retunrs to exercise.            Exercise Goals and Review:   Exercise Goals     Row Name 10/16/22 1144             Exercise Goals   Increase Physical Activity Yes       Intervention Provide advice, education, support and counseling about physical activity/exercise needs.;Develop an individualized exercise prescription for aerobic and resistive training based on initial evaluation findings, risk stratification, comorbidities and participant's personal goals.       Expected Outcomes Short Term: Attend rehab on a regular basis to increase amount of physical activity.;Long Term: Exercising regularly at least 3-5 days a week.;Long Term: Add in home exercise to make exercise part of routine and to increase amount of physical activity.  Increase Strength and Stamina Yes       Intervention Provide advice, education, support and counseling about physical activity/exercise needs.;Develop an individualized exercise prescription for aerobic and resistive training based on initial evaluation findings, risk stratification, comorbidities and participant's personal goals.       Expected Outcomes Short Term: Increase workloads from initial exercise prescription for resistance, speed, and METs.;Short Term: Perform resistance training exercises routinely during rehab and add in resistance training at home;Long Term: Improve cardiorespiratory fitness, muscular endurance and strength as measured by increased METs and functional capacity (6MWT)       Able to understand and use rate of perceived exertion (RPE) scale Yes       Intervention Provide education and explanation on how to use RPE scale       Expected Outcomes Short Term: Able to use RPE daily in rehab to express subjective intensity level;Long Term:  Able to use  RPE to guide intensity level when exercising independently       Knowledge and understanding of Target Heart Rate Range (THRR) Yes       Intervention Provide education and explanation of THRR including how the numbers were predicted and where they are located for reference       Expected Outcomes Short Term: Able to state/look up THRR;Long Term: Able to use THRR to govern intensity when exercising independently;Short Term: Able to use daily as guideline for intensity in rehab       Able to check pulse independently Yes       Intervention Provide education and demonstration on how to check pulse in carotid and radial arteries.;Review the importance of being able to check your own pulse for safety during independent exercise       Expected Outcomes Short Term: Able to explain why pulse checking is important during independent exercise;Long Term: Able to check pulse independently and accurately       Understanding of Exercise Prescription Yes       Intervention Provide education, explanation, and written materials on patient's individual exercise prescription       Expected Outcomes Short Term: Able to explain program exercise prescription;Long Term: Able to explain home exercise prescription to exercise independently                Exercise Goals Re-Evaluation :  Exercise Goals Re-Evaluation     Row Name 10/24/22 1616 11/07/22 1647 12/05/22 1636         Exercise Goal Re-Evaluation   Exercise Goals Review Increase Physical Activity;Understanding of Exercise Prescription;Knowledge and understanding of Target Heart Rate Range (THRR);Able to understand and use rate of perceived exertion (RPE) scale;Increase Strength and Stamina Increase Physical Activity;Understanding of Exercise Prescription;Knowledge and understanding of Target Heart Rate Range (THRR);Able to understand and use rate of perceived exertion (RPE) scale;Increase Strength and Stamina Increase Physical Activity;Understanding of  Exercise Prescription;Knowledge and understanding of Target Heart Rate Range (THRR);Able to understand and use rate of perceived exertion (RPE) scale;Increase Strength and Stamina     Comments Pt first day in the [facility] program. Pt tolerated exercise well with an average MET level of 1.8. Pt is learning her THRR, RPE and ExRx. Overall pt off to a great start and feels good. Reviewed MET's, goals and home ExRx. Pt tolerated exercise well with an average MET level of 1.9. Pt is feeling good about her goals and feels like shes on the right track for increasing strength and endurance. Pt will continue to exercise by walking 1-2 days a week  by walking for 15-30 mins as tolerated. Pt is interested in joining the [facility] when she graduated but feels like it will be too much right now. Reviewed MET's and goals. Pt tolerated exercise well with an average MET level of 1.7. Pt has been absent, but was archieving 2.1 average MET's before and today achived 1.7. Will work on increasing [place] and increasing strength as she retunrs to exercise.     Expected Outcomes Will continue to monitor pt and progress workloads as tolerated without sign or symptom Pt will addin walking 1-2 days 15-30 mins as tolerated. Will continue to monitor pt and progress workloads as tolerated without sign or symptom Will continue to monitor pt and progress workloads as tolerated without sign or symptom              Discharge Exercise Prescription (Final Exercise Prescription Changes):  Exercise Prescription Changes - 12/05/22 1633       Response to Exercise   Blood Pressure (Admit) 108/60    Blood Pressure (Exercise) 146/56    Blood Pressure (Exit) 122/70    Heart Rate (Admit) 77 bpm    Heart Rate (Exercise) 95 bpm    Heart Rate (Exit) 71 bpm    Rating of Perceived Exertion (Exercise) 12    Perceived Dyspnea (Exercise) 0    Symptoms 0    Comments Reviewed MET's and goals    Duration Progress to 30 minutes of  aerobic without  signs/symptoms of physical distress    Intensity THRR unchanged      Progression   Progression Continue to progress workloads to maintain intensity without signs/symptoms of physical distress.    Average METs 1.7      Resistance Training   Training Prescription No      T5 Nustep   Level 2    SPM 72    Minutes 30    METs 1.7      Home Exercise Plan   Plans to continue exercise at Home (comment)    Frequency Add 2 additional days to program exercise sessions.    Initial Home Exercises Provided 11/07/22             Nutrition:  Target Goals: Understanding of nutrition guidelines, daily intake of sodium '1500mg'$ , cholesterol '200mg'$ , calories 30% from fat and 7% or less from saturated fats, daily to have 5 or more servings of fruits and vegetables.  Biometrics:  Pre Biometrics - 10/16/22 1015       Pre Biometrics   Waist Circumference 43.5 inches    Hip Circumference 49.75 inches    Waist to Hip Ratio 0.87 %    Triceps Skinfold 34 mm    % Body Fat 46.4 %    Grip Strength 14 kg    Flexibility --   Not performed due to back stiffness.   Single Leg Stand 7.81 seconds              Nutrition Therapy Plan and Nutrition Goals:  Nutrition Therapy & Goals - 11/21/22 1536       Nutrition Therapy   Diet Heart Healthy Diet    Drug/Food Interactions Statins/Certain Fruits      Personal Nutrition Goals   Nutrition Goal Patient to identify strategies for improving cardiovascular risk by attending the weekly [facility] education series    Personal Goal #2 Patient to improve diet quality by using the plate method as a guide for meal planning to include lean protein/plant protein, fruits, vegetables, whole grains, and nonfat dairy  as part of a heart healthy diet    Personal Goal #3 Patient to limit to '1500mg'$  of sodium daily.    Comments Goals in progress. Shivangi continues to attend the [facility] education and nutrition series regularly. She has better understanding of heart healthy  nutrition goals including reading food labels for sodium and increased fiber intake. She is down 9# (BMI 31.59) since starting with our program. Taylee will continue to benefit from participation in intensive cardiac rehab for nutrition support, exercise, and lifestyle modification.      Intervention Plan   Intervention Prescribe, educate and counsel regarding individualized specific dietary modifications aiming towards targeted core components such as weight, hypertension, lipid management, diabetes, heart failure and other comorbidities.;Nutrition handout(s) given to patient.    Expected Outcomes Short Term Goal: Understand basic principles of dietary content, such as calories, fat, sodium, cholesterol and nutrients.;Long Term Goal: Adherence to prescribed nutrition plan.             Nutrition Assessments:  MEDIFICTS Score Key: ?70 Need to make dietary changes  40-70 Heart Healthy Diet ? 40 Therapeutic Level Cholesterol Diet    Picture Your Plate Scores: <34 Unhealthy dietary pattern with much room for improvement. 41-50 Dietary pattern unlikely to meet recommendations for good health and room for improvement. 51-60 More healthful dietary pattern, with some room for improvement.  >60 Healthy dietary pattern, although there may be some specific behaviors that could be improved.    Nutrition Goals Re-Evaluation:  Nutrition Goals Re-Evaluation     [place] Name 10/25/22 0815 11/21/22 1536           Goals   Current Weight 213 lb 6.5 oz (96.8 kg) 201 lb 11.5 oz (91.5 kg)      Comment hemoglobin 9.0. hematocrit 27.5, GFR 59 no new labs at this time; LDL 88- lipids WNL      Expected Outcome Oma live alone and does very little cooking at this point. She eats out frequently and admits her diet has room for improvement. Marisa will benefit from adherance to the [place] plan and attending cardiac rehab to support LDL '70mg'$ /dL, HDL >'40mg'$ /dL, sodium intake '1500mg'$ /day, and improve diet quality.  Goals in progress. Lylie continues to attend the [facility] education and nutrition series regularly. She has better understanding of heart healthy nutrition goals including reading food labels for sodium and increased fiber intake. She is down 9# (BMI 31.59) since starting with our program. Dwana will continue to benefit from participation in intensive cardiac rehab for nutrition support, exercise, and lifestyle modification.               Nutrition Goals Re-Evaluation:  Nutrition Goals Re-Evaluation     [place]li[place]Fela [place] Name 10/25/22 0815 11/21/22 1536           Goals   Current Weight 213 lb 6.5 oz (96.8 kg) 201 lb 11.5 oz (91.5 kg)      Comment hemoglobin 9.0. hematocrit 27.5, GFR 59 no new labs at this time; LDL 88- lipids WNL      Expected Outcome Jaanvi live alone and does very little cooking at this point. She eats out frequently and admits her diet has room for improvement. Calvary will benefit from adherance to the [place] plan and attending cardiac rehab to support LDL '70mg'$ /dL, HDL >'40mg'$ /dL, sodium intake '1500mg'$ /day, and improve diet quality. Goals in progress. Ezabella continues to attend the [facility] education and nutrition series regularly. She has better understanding of heart healthy nutrition goals including reading food labels for  sodium and increased fiber intake. She is down 9# (BMI 31.59) since starting with our program. Teegan will continue to benefit from participation in intensive cardiac rehab for nutrition support, exercise, and lifestyle modification.               Nutrition Goals Discharge (Final Nutrition Goals Re-Evaluation):  Nutrition Goals Re-Evaluation - 11/21/22 1536       Goals   Current Weight 201 lb 11.5 oz (91.5 kg)    Comment no new labs at this time; LDL 88- lipids WNL    Expected Outcome Goals in progress. Hasini continues to attend the [facility] education and nutrition series regularly. She has better understanding of heart healthy nutrition goals including reading food  labels for sodium and increased fiber intake. She is down 9# (BMI 31.59) since starting with our program. Ambry will continue to benefit from participation in intensive cardiac rehab for nutrition support, exercise, and lifestyle modification.             Psychosocial: Target Goals: Acknowledge presence or absence of significant depression and/or stress, maximize coping skills, provide positive support system. Participant is able to verbalize types and ability to use techniques and skills needed for reducing stress and depression.  Initial Review & Psychosocial Screening:  Initial Psych Review & Screening - 10/16/22 1139       Initial Review   Current issues with None Identified      Family Dynamics   Good Support System? Yes   Dunnigan lives alone she has her son whom lives nearby. Sarah's other son lives in [place] and [place]     Barriers   Psychosocial barriers to participate in program There are no identifiable barriers or psychosocial needs.      Screening Interventions   Interventions Encouraged to exercise             Quality of Life Scores:  Quality of Life - 10/16/22 1356       Quality of Life   Select Quality of Life      Quality of Life Scores   Health/Function Pre 22.71 %    Socioeconomic Pre 26.5 %    Psych/Spiritual Pre 25.64 %    Family Pre 27 %    GLOBAL Pre 24.6 %            Scores of 19 and below usually indicate a poorer quality of life in these areas.  A difference of  2-3 points is a clinically meaningful difference.  A difference of 2-3 points in the total score of the Quality of Life Index has been associated with significant improvement in overall quality of life, self-image, physical symptoms, and general health in studies assessing change in quality of life.  PHQ-9: Review Flowsheet       10/16/2022  Depression screen PHQ 2/9  Decreased Interest 0  Down, Depressed, Hopeless 0  PHQ - 2 Score 0   Interpretation of Total Score   Total Score Depression Severity:  1-4 = Minimal depression, 5-9 = Mild depression, 10-14 = Moderate depression, 15-19 = Moderately severe depression, 20-27 = Severe depression   Psychosocial Evaluation and Intervention:   Psychosocial Re-Evaluation:  Psychosocial Re-Evaluation     [place] Name 10/24/22 1627 11/13/22 1533 12/12/22 1653         Psychosocial Re-Evaluation   Current issues with None Identified None Identified Current Stress Concerns     Comments -- -- Mutch has had some issure due to a prolapsed bladder. Last exercise day  was 12/05/21     Interventions Encouraged to attend Cardiac Rehabilitation for the exercise Encouraged to attend Cardiac Rehabilitation for the exercise Encouraged to attend Cardiac Rehabilitation for the exercise     Continue Psychosocial Services  No Follow up required No Follow up required No Follow up required       Initial Review   Source of Stress Concerns -- -- Chronic Illness     Comments -- -- Will continue to monitor and offer support as needed              Psychosocial Discharge (Final Psychosocial Re-Evaluation):  Psychosocial Re-Evaluation - 12/12/22 1653       Psychosocial Re-Evaluation   Current issues with Current Stress Concerns    Comments Pietsch has had some issure due to a prolapsed bladder. Last exercise day was 12/05/21    Interventions Encouraged to attend Cardiac Rehabilitation for the exercise    Continue Psychosocial Services  No Follow up required      Initial Review   Source of Stress Concerns Chronic Illness    Comments Will continue to monitor and offer support as needed             Vocational Rehabilitation: Provide vocational rehab assistance to qualifying candidates.   Vocational Rehab Evaluation & Intervention:  Vocational Rehab - 10/16/22 1138       Initial Vocational Rehab Evaluation & Intervention   Assessment shows need for Vocational Rehabilitation No   Linnen is retired and does not need  vocational rehab at this time            Education: Education Goals: Education classes will be provided on a weekly basis, covering required topics. Participant will state understanding/return demonstration of topics presented.    Education     Row Name 10/24/22 1600     Education   Cardiac Education Topics Pritikin   Financial trader   Weekly Topic Fast Evening Meals   Instruction Review Code 1- Verbalizes Understanding  Butternut squash soup   Class Start Time 1355   Class Stop Time 1435   Class Time Calculation (min) 40 min    Row Name 10/26/22 1500     Education   Cardiac Education Topics Pritikin   Charity fundraiser Exercise Physiologist   Select Psychosocial   Psychosocial How Our Thoughts Can Heal Our Hearts   Instruction Review Code 1- Verbalizes Understanding   Class Start Time 1357   Class Stop Time 1440   Class Time Calculation (min) 43 min    [place] Name 10/29/22 1500     Education   Cardiac Education Topics Pritikin   IT sales professional Nutrition   Nutrition Workshop Fueling a Designer, multimedia   Instruction Review Code 1- Engineer, civil (consulting) Start Time 1300   Class Stop Time 1345   Class Time Calculation (min) 45 min    [place] Name 10/31/22 1500     Education   Cardiac Education Topics Pritikin   Financial trader   Weekly Topic International Cuisine- Spotlight on the [place]   Instruction Review Code 1- Verbalizes Understanding   Class Start Time 1355   Class Stop Time 1435   Class Time Calculation (min) 40 min    Row Name  11/02/22 1200     Education   Cardiac Education Topics Pritikin   Environmental consultant Psychosocial   Psychosocial Workshop Recognizing and Reducing Stress   Instruction Review Code 1-  Verbalizes Understanding   Class Start Time 9629   Class Stop Time 1228   Class Time Calculation (min) 45 min    [place] Name 11/07/22 1600     Education   Cardiac Education Topics Pritikin   Charity fundraiser Exercise Physiologist   Select Nutrition   Nutrition Cooking - Healthy Salads and Dressing   Instruction Review Code 1- Verbalizes Understanding   Class Start Time 1358   Class Stop Time 1442   Class Time Calculation (min) 44 min    [place] Name 11/09/22 1500     Education   Cardiac Education Topics Pritikin   Architect Education   General Education Heart Disease Risk Reduction   Instruction Review Code 1- Verbalizes Understanding   Class Start Time 1400   Class Stop Time 1440   Class Time Calculation (min) 40 min    [place] Name 11/14/22 1500     Education   Cardiac Education Topics Pritikin   Financial trader   Weekly Topic Fast and Healthy Breakfasts   Instruction Review Code 1- Verbalizes Understanding   Class Start Time 5284   Class Stop Time 1438   Class Time Calculation (min) 43 min    [place] Name 11/16/22 1500     Education   Cardiac Education Topics Pritikin   Lexicographer Nutrition   Nutrition Overview of the [place]   Instruction Review Code 1- Verbalizes Understanding   Class Start Time 1400   Class Stop Time 1440   Class Time Calculation (min) 40 min    [place] Name 11/19/22 1200     Education   Cardiac Education Topics Pritikin   Select Workshops     Workshops   Educator Exercise Physiologist   Select Exercise   Exercise Workshop Hotel manager and Fall Prevention   Instruction Review Code 1- Verbalizes Understanding   Class Start Time 1140   Class Stop Time 1221   Class Time Calculation (min) 41 min    [place] Name 11/21/22 1600      Education   Cardiac Education Topics [employer]   Educator Dietitian   Weekly Topic Personalizing Your Pritikin Plate   Instruction Review Code 1- Verbalizes Understanding   Class Start Time 1403   Class Stop Time 1458   Class Time Calculation (min) 55 min    [place] Name 12/03/22 1528     Education   Cardiac Education Topics [employer]   [employer]     Workshops   Educator Exercise Physiologist   Select Psychosocial   Psychosocial Workshop Other  Focused goals and sustainable changes   Instruction Review Code 1- Verbalizes Understanding   Class Start Time 1402   Class Stop Time 1445   Class Time Calculation (min) 43 min     Cooking School   Instruction Review Code 1- [employer]    [place] Name 12/05/22 1600  Education   Cardiac Education Topics Pritikin   Financial trader   Weekly Topic Tasty Appetizers and Snacks   Instruction Review Code 1- Verbalizes Understanding   Class Start Time 1402   Class Stop Time 1448   Class Time Calculation (min) 46 min            Core Videos: Exercise    Move It!  Clinical staff conducted group or individual video education with verbal and written material and guidebook.  Patient learns the recommended Pritikin exercise program. Exercise with the goal of living a long, healthy life. Some of the health benefits of exercise include controlled diabetes, healthier blood pressure levels, improved cholesterol levels, improved heart and lung capacity, improved sleep, and better body composition. Everyone should speak with their doctor before starting or changing an exercise routine.  Biomechanical Limitations Clinical staff conducted group or individual video education with verbal and written material and guidebook.  Patient learns how biomechanical limitations can impact exercise and how we can mitigate and possibly overcome  limitations to have an impactful and balanced exercise routine.  Body Composition Clinical staff conducted group or individual video education with verbal and written material and guidebook.  Patient learns that body composition (ratio of muscle mass to fat mass) is a key component to assessing overall fitness, rather than body weight alone. Increased fat mass, especially visceral belly fat, can put [place] at increased risk for metabolic syndrome, type 2 diabetes, heart disease, and even death. It is recommended to combine diet and exercise (cardiovascular and resistance training) to improve your body composition. Seek guidance from your physician and exercise physiologist before implementing an exercise routine.  Exercise Action Plan Clinical staff conducted group or individual video education with verbal and written material and guidebook.  Patient learns the recommended strategies to achieve and enjoy long-term exercise adherence, including variety, self-motivation, self-efficacy, and positive decision making. Benefits of exercise include fitness, good health, weight management, more energy, better sleep, less stress, and overall well-being.  Medical   Heart Disease Risk Reduction Clinical staff conducted group or individual video education with verbal and written material and guidebook.  Patient learns our heart is our most vital organ as it circulates oxygen, nutrients, white blood cells, and hormones throughout the entire body, and carries waste away. Data supports a plant-based eating plan like the Pritikin Program for its effectiveness in slowing progression of and reversing heart disease. The video provides a number of recommendations to address heart disease.   Metabolic Syndrome and Belly Fat  Clinical staff conducted group or individual video education with verbal and written material and guidebook.  Patient learns what metabolic syndrome is, how it leads to heart disease, and how one can  reverse it and keep it from coming back. You have metabolic syndrome if you have 3 of the following 5 criteria: abdominal obesity, high blood pressure, high triglycerides, low HDL cholesterol, and high blood sugar.  Hypertension and Heart Disease Clinical staff conducted group or individual video education with verbal and written material and guidebook.  Patient learns that high blood pressure, or hypertension, is very common in the [place]. Hypertension is largely due to excessive salt intake, but other important risk factors include being overweight, physical inactivity, drinking too much alcohol, smoking, and not eating enough potassium from fruits and vegetables. High blood pressure is a leading risk factor for heart attack, stroke, congestive heart failure, dementia, kidney failure, and premature  death. Long-term effects of excessive salt intake include stiffening of the arteries and thickening of heart muscle and organ damage. Recommendations include ways to reduce hypertension and the risk of heart disease.  Diseases of Our Time - Focusing on Diabetes Clinical staff conducted group or individual video education with verbal and written material and guidebook.  Patient learns why the best way to stop diseases of our time is prevention, through food and other lifestyle changes. Medicine (such as prescription pills and surgeries) is often only a Band-Aid on the problem, not a long-term solution. Most common diseases of our time include obesity, type 2 diabetes, hypertension, heart disease, and cancer. The Pritikin Program is recommended and has been proven to help reduce, reverse, and/or prevent the damaging effects of metabolic syndrome.  Nutrition   Overview of the Pritikin Eating Plan  Clinical staff conducted group or individual video education with verbal and written material and guidebook.  Patient learns about the [place] for disease risk reduction. The [place]  emphasizes a wide variety of unrefined, minimally-processed carbohydrates, like fruits, vegetables, whole grains, and legumes. Go, Caution, and Stop food choices are explained. Plant-based and lean animal proteins are emphasized. Rationale provided for low sodium intake for blood pressure control, low added sugars for blood sugar stabilization, and low added fats and oils for coronary artery disease risk reduction and weight management.  Calorie Density  Clinical staff conducted group or individual video education with verbal and written material and guidebook.  Patient learns about calorie density and how it impacts the Pritikin Eating Plan. Knowing the characteristics of the food you choose will help you decide whether those foods will lead to weight gain or weight loss, and whether you want to consume more or less of them. Weight loss is usually a side effect of the Pritikin Eating Plan because of its focus on low calorie-dense foods.  Label Reading  Clinical staff conducted group or individual video education with verbal and written material and guidebook.  Patient learns about the Pritikin recommended label reading guidelines and corresponding recommendations regarding calorie density, added sugars, sodium content, and whole grains.  Dining Out - Part 1  Clinical staff conducted group or individual video education with verbal and written material and guidebook.  Patient learns that restaurant meals can be sabotaging because they can be so high in calories, fat, sodium, and/or sugar. Patient learns recommended strategies on how to positively address this and avoid unhealthy pitfalls.  Facts on Fats  Clinical staff conducted group or individual video education with verbal and written material and guidebook.  Patient learns that lifestyle modifications can be just as effective, if not more so, as many medications for lowering your risk of heart disease. A Pritikin lifestyle can help to reduce your  risk of inflammation and atherosclerosis (cholesterol build-up, or plaque, in the artery walls). Lifestyle interventions such as dietary choices and physical activity address the cause of atherosclerosis. A review of the types of fats and their impact on blood cholesterol levels, along with dietary recommendations to reduce fat intake is also included.  Nutrition Action Plan  Clinical staff conducted group or individual video education with verbal and written material and guidebook.  Patient learns how to incorporate Pritikin recommendations into their lifestyle. Recommendations include planning and keeping personal health goals in mind as an important part of their success.  Healthy Mind-Set    Healthy Minds, Bodies, Hearts  Clinical staff conducted group or individual video education with verbal and  written material and guidebook.  Patient learns how to identify when they are stressed. Video will discuss the impact of that stress, as well as the many benefits of stress management. Patient will also be introduced to stress management techniques. The way we think, act, and feel has an impact on our hearts.  How Our Thoughts Can Heal Our Hearts  Clinical staff conducted group or individual video education with verbal and written material and guidebook.  Patient learns that negative thoughts can cause depression and anxiety. This can result in negative lifestyle behavior and serious health problems. Cognitive behavioral therapy is an effective method to help control our thoughts in order to change and improve our emotional outlook.  Additional Videos:  Exercise    Improving Performance  Clinical staff conducted group or individual video education with verbal and written material and guidebook.  Patient learns to use a non-linear approach by alternating intensity levels and lengths of time spent exercising to help burn more calories and lose more body fat. Cardiovascular exercise helps improve heart  health, metabolism, hormonal balance, blood sugar control, and recovery from fatigue. Resistance training improves strength, endurance, balance, coordination, reaction time, metabolism, and muscle mass. Flexibility exercise improves circulation, posture, and balance. Seek guidance from your physician and exercise physiologist before implementing an exercise routine and learn your capabilities and proper form for all exercise.  Introduction to Yoga  Clinical staff conducted group or individual video education with verbal and written material and guidebook.  Patient learns about yoga, a discipline of the coming together of mind, breath, and body. The benefits of yoga include improved flexibility, improved range of motion, better posture and core strength, increased lung function, weight loss, and positive self-image. Yoga's heart health benefits include lowered blood pressure, healthier heart rate, decreased cholesterol and triglyceride levels, improved immune function, and reduced stress. Seek guidance from your physician and exercise physiologist before implementing an exercise routine and learn your capabilities and proper form for all exercise.  Medical   Aging: Enhancing Your Quality of Life  Clinical staff conducted group or individual video education with verbal and written material and guidebook.  Patient learns key strategies and recommendations to stay in good physical health and enhance quality of life, such as prevention strategies, having an advocate, securing a [place], and keeping a list of medications and system for tracking them. It also discusses how to avoid risk for bone loss.  Biology of Weight Control  Clinical staff conducted group or individual video education with verbal and written material and guidebook.  Patient learns that weight gain occurs because we consume more calories than we burn (eating more, moving less). Even if your body weight is  normal, you may have higher ratios of fat compared to muscle mass. Too much body fat puts you at increased risk for cardiovascular disease, heart attack, stroke, type 2 diabetes, and obesity-related cancers. In addition to exercise, following the [place] can help reduce your risk.  Decoding Lab Results  Clinical staff conducted group or individual video education with verbal and written material and guidebook.  Patient learns that lab test reflects one measurement whose values change over time and are influenced by many factors, including medication, stress, sleep, exercise, food, hydration, pre-existing medical conditions, and more. It is recommended to use the knowledge from this video to become more involved with your lab results and evaluate your numbers to speak with your doctor.   Diseases of Our Time -  Overview  Clinical staff conducted group or individual video education with verbal and written material and guidebook.  Patient learns that according to the CDC, 50% to 70% of chronic diseases (such as obesity, type 2 diabetes, elevated lipids, hypertension, and heart disease) are avoidable through lifestyle improvements including healthier food choices, listening to satiety cues, and increased physical activity.  Sleep Disorders Clinical staff conducted group or individual video education with verbal and written material and guidebook.  Patient learns how good quality and duration of sleep are important to overall health and well-being. Patient also learns about sleep disorders and how they impact health along with recommendations to address them, including discussing with a physician.  Nutrition  Dining Out - Part 2 Clinical staff conducted group or individual video education with verbal and written material and guidebook.  Patient learns how to plan ahead and communicate in order to maximize their dining experience in a healthy and nutritious manner. Included are recommended  food choices based on the type of restaurant the patient is visiting.   Fueling a Best boy conducted group or individual video education with verbal and written material and guidebook.  There is a strong connection between our food choices and our health. Diseases like obesity and type 2 diabetes are very prevalent and are in large-part due to lifestyle choices. The Pritikin Eating Plan provides plenty of food and hunger-curbing satisfaction. It is easy to follow, affordable, and helps reduce health risks.  Menu Workshop  Clinical staff conducted group or individual video education with verbal and written material and guidebook.  Patient learns that restaurant meals can sabotage health goals because they are often packed with calories, fat, sodium, and sugar. Recommendations include strategies to plan ahead and to communicate with the manager, chef, or server to help order a healthier meal.  Planning Your Eating Strategy  Clinical staff conducted group or individual video education with verbal and written material and guidebook.  Patient learns about the [place] and its benefit of reducing the risk of disease. The [place] does not focus on calories. Instead, it emphasizes high-quality, nutrient-rich foods. By knowing the characteristics of the foods, we choose, we can determine their calorie density and make informed decisions.  Targeting Your Nutrition Priorities  Clinical staff conducted group or individual video education with verbal and written material and guidebook.  Patient learns that lifestyle habits have a tremendous impact on disease risk and progression. This video provides eating and physical activity recommendations based on your personal health goals, such as reducing LDL cholesterol, losing weight, preventing or controlling type 2 diabetes, and reducing high blood pressure.  Vitamins and Minerals  Clinical staff conducted group or  individual video education with verbal and written material and guidebook.  Patient learns different ways to obtain key vitamins and minerals, including through a recommended healthy diet. It is important to discuss all supplements you take with your doctor.   Healthy Mind-Set    Smoking Cessation  Clinical staff conducted group or individual video education with verbal and written material and guidebook.  Patient learns that cigarette smoking and tobacco addiction pose a serious health risk which affects millions of people. Stopping smoking will significantly reduce the risk of heart disease, lung disease, and many forms of cancer. Recommended strategies for quitting are covered, including working with your doctor to develop a successful plan.  Culinary   Becoming a Financial trader conducted group or individual video education with verbal  and written material and guidebook.  Patient learns that cooking at home can be healthy, cost-effective, quick, and puts them in control. Keys to cooking healthy recipes will include looking at your recipe, assessing your equipment needs, planning ahead, making it simple, choosing cost-effective seasonal ingredients, and limiting the use of added fats, salts, and sugars.  Cooking - Breakfast and Snacks  Clinical staff conducted group or individual video education with verbal and written material and guidebook.  Patient learns how important breakfast is to satiety and nutrition through the entire day. Recommendations include key foods to eat during breakfast to help stabilize blood sugar levels and to prevent overeating at meals later in the day. Planning ahead is also a key component.  Cooking - Human resources officer conducted group or individual video education with verbal and written material and guidebook.  Patient learns eating strategies to improve overall health, including an approach to cook more at home. Recommendations include  thinking of animal protein as a side on your plate rather than center stage and focusing instead on lower calorie dense options like vegetables, fruits, whole grains, and plant-based proteins, such as beans. Making sauces in large quantities to freeze for later and leaving the skin on your vegetables are also recommended to maximize your experience.  Cooking - Healthy Salads and Dressing Clinical staff conducted group or individual video education with verbal and written material and guidebook.  Patient learns that vegetables, fruits, whole grains, and legumes are the foundations of the [place]. Recommendations include how to incorporate each of these in flavorful and healthy salads, and how to create homemade salad dressings. Proper handling of ingredients is also covered. Cooking - Soups and [employer] - Soups and Desserts Clinical staff conducted group or individual video education with verbal and written material and guidebook.  Patient learns that Pritikin soups and desserts make for easy, nutritious, and delicious snacks and meal components that are low in sodium, fat, sugar, and calorie density, while high in vitamins, minerals, and filling fiber. Recommendations include simple and healthy ideas for soups and desserts.   Overview     The Pritikin Solution Program Overview Clinical staff conducted group or individual video education with verbal and written material and guidebook.  Patient learns that the results of the [place] Program have been documented in more than 100 articles published in peer-reviewed journals, and the benefits include reducing risk factors for (and, in some cases, even reversing) high cholesterol, high blood pressure, type 2 diabetes, obesity, and more! An overview of the three key pillars of the Pritikin Program will be covered: eating well, doing regular exercise, and having a healthy mind-set.  WORKSHOPS  Exercise: Exercise Basics: Building Your  Action Plan Clinical staff led group instruction and group discussion with PowerPoint presentation and patient guidebook. To enhance the learning environment the use of posters, models and videos may be added. At the conclusion of this workshop, patients will comprehend the difference between physical activity and exercise, as well as the benefits of incorporating both, into their routine. Patients will understand the FITT (Frequency, Intensity, Time, and Type) principle and how to use it to build an exercise action plan. In addition, safety concerns and other considerations for exercise and cardiac rehab will be addressed by the presenter. The purpose of this lesson is to promote a comprehensive and effective weekly exercise routine in order to improve patients' overall level of fitness.   Managing Heart Disease: Your Path to a Healthier  Heart Clinical staff led group instruction and group discussion with PowerPoint presentation and patient guidebook. To enhance the learning environment the use of posters, models and videos may be added.At the conclusion of this workshop, patients will understand the anatomy and physiology of the heart. Additionally, they will understand how Pritikin's three pillars impact the risk factors, the progression, and the management of heart disease.  The purpose of this lesson is to provide a high-level overview of the heart, heart disease, and how the Pritikin lifestyle positively impacts risk factors.  Exercise Biomechanics Clinical staff led group instruction and group discussion with PowerPoint presentation and patient guidebook. To enhance the learning environment the use of posters, models and videos may be added. Patients will learn how the structural parts of their bodies function and how these functions impact their daily activities, movement, and exercise. Patients will learn how to promote a neutral spine, learn how to manage pain, and identify ways to  improve their physical movement in order to promote healthy living. The purpose of this lesson is to expose patients to common physical limitations that impact physical activity. Participants will learn practical ways to adapt and manage aches and pains, and to minimize their effect on regular exercise. Patients will learn how to maintain good posture while sitting, walking, and lifting.  Balance Training and Fall Prevention  Clinical staff led group instruction and group discussion with PowerPoint presentation and patient guidebook. To enhance the learning environment the use of posters, models and videos may be added. At the conclusion of this workshop, patients will understand the importance of their sensorimotor skills (vision, proprioception, and the vestibular system) in maintaining their ability to balance as they age. Patients will apply a variety of balancing exercises that are appropriate for their current level of function. Patients will understand the common causes for poor balance, possible solutions to these problems, and ways to modify their physical environment in order to minimize their fall risk. The purpose of this lesson is to teach patients about the importance of maintaining balance as they age and ways to minimize their risk of falling.  WORKSHOPS   Nutrition:  Fueling a Scientist, research (physical sciences) led group instruction and group discussion with PowerPoint presentation and patient guidebook. To enhance the learning environment the use of posters, models and videos may be added. Patients will review the foundational principles of the [place] and understand what constitutes a serving size in each of the food groups. Patients will also learn Pritikin-friendly foods that are better choices when away from home and review make-ahead meal and snack options. Calorie density will be reviewed and applied to three nutrition priorities: weight maintenance, weight loss, and  weight gain. The purpose of this lesson is to reinforce (in a group setting) the key concepts around what patients are recommended to eat and how to apply these guidelines when away from home by planning and selecting Pritikin-friendly options. Patients will understand how calorie density may be adjusted for different weight management goals.  Mindful Eating  Clinical staff led group instruction and group discussion with PowerPoint presentation and patient guidebook. To enhance the learning environment the use of posters, models and videos may be added. Patients will briefly review the concepts of the [place] and the importance of low-calorie dense foods. The concept of mindful eating will be introduced as well as the importance of paying attention to internal hunger signals. Triggers for non-hunger eating and techniques for dealing with triggers will be explored.  The purpose of this lesson is to provide patients with the opportunity to review the basic principles of the [place], discuss the value of eating mindfully and how to measure internal cues of hunger and fullness using the Hunger Scale. Patients will also discuss reasons for non-hunger eating and learn strategies to use for controlling emotional eating.  Targeting Your Nutrition Priorities Clinical staff led group instruction and group discussion with PowerPoint presentation and patient guidebook. To enhance the learning environment the use of posters, models and videos may be added. Patients will learn how to determine their genetic susceptibility to disease by reviewing their family history. Patients will gain insight into the importance of diet as part of an overall healthy lifestyle in mitigating the impact of genetics and other environmental insults. The purpose of this lesson is to provide patients with the opportunity to assess their personal nutrition priorities by looking at their family history, their own health  history and current risk factors. Patients will also be able to discuss ways of prioritizing and modifying the [place] for their highest risk areas  Menu  Clinical staff led group instruction and group discussion with PowerPoint presentation and patient guidebook. To enhance the learning environment the use of posters, models and videos may be added. Using menus brought in from [employer], or printed from [employer], patients will apply the [place] dining out guidelines that were presented in the [employer] video. Patients will also be able to practice these guidelines in a variety of provided scenarios. The purpose of this lesson is to provide patients with the opportunity to practice hands-on learning of the [place] with actual menus and practice scenarios.  Label Reading Clinical staff led group instruction and group discussion with PowerPoint presentation and patient guidebook. To enhance the learning environment the use of posters, models and videos may be added. Patients will review and discuss the Pritikin label reading guidelines presented in Pritikin's Label Reading Educational series video. Using fool labels brought in from local grocery stores and markets, patients will apply the label reading guidelines and determine if the packaged food meet the Pritikin guidelines. The purpose of this lesson is to provide patients with the opportunity to review, discuss, and practice hands-on learning of the Pritikin Label Reading guidelines with actual packaged food labels. [place] Workshops are designed to teach patients ways to prepare quick, simple, and affordable recipes at home. The importance of nutrition's role in chronic disease risk reduction is reflected in its emphasis in the overall Pritikin program. By learning how to prepare essential core Pritikin Eating Plan recipes, patients will increase  control over what they eat; be able to customize the flavor of foods without the use of added salt, sugar, or fat; and improve the quality of the food they consume. By learning a set of core recipes which are easily assembled, quickly prepared, and affordable, patients are more likely to prepare more healthy foods at home. These workshops focus on convenient breakfasts, simple entres, side dishes, and desserts which can be prepared with minimal effort and are consistent with nutrition recommendations for cardiovascular risk reduction. Cooking [employer] are taught by a Engineer, materials (RD) who has been trained by the [employer]. The chef or RD has a clear understanding of the importance of minimizing - if not completely eliminating - added fat, sugar, and sodium in recipes. Throughout the series of [employer]  sessions, patients will learn about healthy ingredients and efficient methods of cooking to build confidence in their capability to prepare    Cooking School weekly topics:  Adding Flavor- Sodium-Free  Fast and Healthy Breakfasts  Powerhouse Plant-Based Proteins  Satisfying Salads and Dressings  Simple Sides and Sauces  International Cuisine-Spotlight on the Blue Zones  Delicious Desserts  Savory Soups  Efficiency Cooking - Meals in a Snap  Tasty Appetizers and Snacks  Comforting Weekend Breakfasts  One-Pot Wonders   Fast Evening Meals  Contractor Your Pritikin Plate  WORKSHOPS   Healthy Mindset (Psychosocial): New Thoughts, New Behaviors Clinical staff led group instruction and group discussion with PowerPoint presentation and patient guidebook. To enhance the learning environment the use of posters, models and videos may be added. Patients will learn and practice techniques for developing effective health and lifestyle goals. Patients will be able to effectively apply the goal setting process learned to develop at  least one new personal goal.  The purpose of this lesson is to expose patients to a new skill set of behavior modification techniques such as techniques setting SMART goals, overcoming barriers, and achieving new thoughts and new behaviors.  Managing Moods and Relationships Clinical staff led group instruction and group discussion with PowerPoint presentation and patient guidebook. To enhance the learning environment the use of posters, models and videos may be added. Patients will learn how emotional and chronic stress factors can impact their health and relationships. They will learn healthy ways to manage their moods and utilize positive coping mechanisms. In addition, ICR patients will learn ways to improve communication skills. The purpose of this lesson is to expose patients to ways of understanding how one's mood and health are intimately connected. Developing a healthy outlook can help build positive relationships and connections with others. Patients will understand the importance of utilizing effective communication skills that include actively listening and being heard. They will learn and understand the importance of the "4 Cs" and especially Connections in fostering of a Healthy Mind-Set.  Healthy Sleep for a Healthy Heart Clinical staff led group instruction and group discussion with PowerPoint presentation and patient guidebook. To enhance the learning environment the use of posters, models and videos may be added. At the conclusion of this workshop, patients will be able to demonstrate knowledge of the importance of sleep to overall health, well-being, and quality of life. They will understand the symptoms of, and treatments for, common sleep disorders. Patients will also be able to identify daytime and nighttime behaviors which impact sleep, and they will be able to apply these tools to help manage sleep-related challenges. The purpose of this lesson is to provide patients with a general  overview of sleep and outline the importance of quality sleep. Patients will learn about a few of the most common sleep disorders. Patients will also be introduced to the concept of "sleep hygiene," and discover ways to self-manage certain sleeping problems through simple daily behavior changes. Finally, the workshop will motivate patients by clarifying the links between quality sleep and their goals of heart-healthy living.   Recognizing and Reducing Stress Clinical staff led group instruction and group discussion with PowerPoint presentation and patient guidebook. To enhance the learning environment the use of posters, models and videos may be added. At the conclusion of this workshop, patients will be able to understand the types of stress reactions, differentiate between acute and chronic stress, and recognize the impact that chronic stress has on their health. They will  also be able to apply different coping mechanisms, such as reframing negative self-talk. Patients will have the opportunity to practice a variety of stress management techniques, such as deep abdominal breathing, progressive muscle relaxation, and/or guided imagery.  The purpose of this lesson is to educate patients on the role of stress in their lives and to provide healthy techniques for coping with it.  Learning Barriers/Preferences:  Learning Barriers/Preferences - 10/16/22 1403       Learning Barriers/Preferences   Learning Barriers Inability to learn new things    Learning Preferences Skilled Demonstration;Video;Pictoral             Education Topics:  Knowledge Questionnaire Score:  Knowledge Questionnaire Score - 10/16/22 1356       Knowledge Questionnaire Score   Pre Score 19/24             Core Components/Risk Factors/Patient Goals at Admission:  Personal Goals and Risk Factors at Admission - 10/16/22 1357       Core Components/Risk Factors/Patient Goals on Admission    Weight Management  Yes;Obesity    Intervention Weight Management/Obesity: Establish reasonable short term and long term weight goals.;Obesity: Provide education and appropriate resources to help participant work on and attain dietary goals.    Admit Weight 210 lb 12.2 oz (95.6 kg)    Expected Outcomes Short Term: Continue to assess and modify interventions until short term weight is achieved;Long Term: Adherence to nutrition and physical activity/exercise program aimed toward attainment of established weight goal;Weight Loss: Understanding of general recommendations for a balanced deficit meal plan, which promotes 1-2 lb weight loss per week and includes a negative energy balance of [phone] kcal/d    Hypertension Yes    Intervention Provide education on lifestyle modifcations including regular physical activity/exercise, weight management, moderate sodium restriction and increased consumption of fresh fruit, vegetables, and low fat dairy, alcohol moderation, and smoking cessation.;Monitor prescription use compliance.    Expected Outcomes Short Term: Continued assessment and intervention until BP is < 140/46m HG in hypertensive participants. < 130/872mHG in hypertensive participants with diabetes, heart failure or chronic kidney disease.;Long Term: Maintenance of blood pressure at goal levels.    Lipids Yes    Intervention Provide education and support for participant on nutrition & aerobic/resistive exercise along with prescribed medications to achieve LDL '70mg'$ , HDL >'40mg'$ .    Expected Outcomes Short Term: Participant states understanding of desired cholesterol values and is compliant with medications prescribed. Participant is following exercise prescription and nutrition guidelines.;Long Term: Cholesterol controlled with medications as prescribed, with individualized exercise RX and with personalized nutrition plan. Value goals: LDL < '70mg'$ , HDL > 40 mg.             Core Components/Risk Factors/Patient Goals  Review:   Goals and Risk Factor Review     Row Name 10/24/22 1631 11/13/22 1535 12/12/22 1654         Core Components/Risk Factors/Patient Goals Review   Personal Goals Review Weight Management/Obesity;Hypertension;Lipids Weight Management/Obesity;Hypertension;Lipids Weight Management/Obesity;Hypertension;Lipids     Review SaNeviatarted intensive cardiac rehab on 10/24/22 and did well with exercise. Vital signs were stable. patient is somewhat deconditioned asked the patient to bring her can with her to use as she uses one at home sometimes SaNaamahs doing well with exercise at intensive cardiac rehab  for her fitness level. Some moderate and exertional exertional BP elevations have been noted at intensive cardiac rehab. Will continue to monitor  BP SaMaecias been  doing well with exercise  at intensive cardiac rehab  for her fitness level. Swint has been out due to a prolasped bladder, urinary tract infection     Expected Outcomes Petron will continue to participate in intensive cardiac rehab for exercise, nutrition and lifestyle modifications Rigaud will continue to participate in intensive cardiac rehab for exercise, nutrition and lifestyle modifications Loree will continue to participate in intensive cardiac rehab for exercise, nutrition and lifestyle modifications              Core Components/Risk Factors/Patient Goals at Discharge (Final Review):   Goals and Risk Factor Review - 12/12/22 1654       Core Components/Risk Factors/Patient Goals Review   Personal Goals Review Weight Management/Obesity;Hypertension;Lipids    Review Alethia has been  doing well with exercise at intensive cardiac rehab  for her fitness level. Rothbauer has been out due to a prolasped bladder, urinary tract infection    Expected Outcomes Standley will continue to participate in intensive cardiac rehab for exercise, nutrition and lifestyle modifications             ITP Comments:  ITP Comments     Row Name 10/16/22  1015 10/24/22 1623 11/13/22 1532 12/12/22 1652     ITP Comments Medical Director- Dr. Fransico Him, MD. Introduction to Pritikin Education Program/ Intensive Cardiac Rehab. Initial orientation packet reviwed with the patient 30 Day ITP Review. Isella started intensive cardiac rehab on 10/24/22 and did well with exercise. Patient is somewhat deconditoned 30 Day ITP Review. Arian has good attendance and participation in  intensive cardiac rehab 30 Day ITP Review. Frenchie has good  participation when in attendance at  intensive cardiac rehab. Cheyann has been out due to a recent UTI             Comments: See ITP comments.Harrell Gave RN BSN

## 2022-12-12 NOTE — ED Provider Notes (Signed)
[facility] Provider Note   CSN: [id number] Arrival date & time: 12/12/22  1252     History  Chief Complaint  Patient presents with   Chest Pain    Traci [name] is a 82 y.o. female with past medical history significant for hypertension, hyperlipidemia, with history of severe aortic stenosis who is status post TAVR procedure last year in October who presents with concern for left-sided chest pain in the middle of the night last night.  She reports that it was sharp and would come and go, she cannot describe it exactly, she denies exertional nature.  She does report some generalized fatigue, shortness of breath.  She denies any nausea, vomiting, diarrhea.  She reports that she has been struggling with a UTI all month, has been seen and evaluated by urology multiple times, recently diagnosed with UTI and is still taking antibiotics at this time.   Chest Pain      Home Medications Prior to Admission medications   Medication Sig Start Date End Date Taking? Authorizing Provider  amoxicillin (AMOXIL) 500 MG tablet Take 4 tablets by mouth 1 hour before dental procedures and cleanings 09/12/22   Tommie Raymond, NP  Artificial Tear Solution (SOOTHE XP) SOLN Place 1 drop into both eyes daily.    [provider]  aspirin EC 81 MG tablet Take 81 mg by mouth daily. Swallow whole.    [provider]  atorvastatin (LIPITOR) 20 MG tablet Take 20 mg by mouth at bedtime.    [provider]  cholecalciferol (VITAMIN D) 1000 UNITS tablet Take 1,000 Units by mouth daily.    [provider]  lisinopril (ZESTRIL) 20 MG tablet TAKE 1 TABLET BY MOUTH EVERY DAY 09/18/22   Croitoru, [name], MD  pantoprazole (PROTONIX) 40 MG tablet Take 40 mg by mouth daily.      [provider]  traMADol (ULTRAM) 50 MG tablet Take 1 tablet (50 mg total) by mouth every 6 (six) hours as needed. Patient not taking: Reported on 10/16/2022  09/06/22 09/06/23  Eileen Stanford, PA-C      Allergies    Patient has no known allergies.    Review of Systems   Review of Systems  Cardiovascular:  Positive for chest pain.  All other systems reviewed and are negative.   Physical Exam Updated Vital Signs BP (!) 171/65   Pulse 64   Temp 97.8 F (36.6 C)   Resp 18   Ht '5\' 7"'$  (1.702 m)   Wt 86.6 kg   SpO2 98%   BMI 29.91 kg/m  Physical Exam Vitals and nursing note reviewed.  Constitutional:      General: She is not in acute distress.    Appearance: Normal appearance.  HENT:     Head: Normocephalic and atraumatic.  Eyes:     General:        Right eye: No discharge.        Left eye: No discharge.  Cardiovascular:     Rate and Rhythm: Normal rate and regular rhythm.     Heart sounds: No murmur heard.    No friction rub. No gallop.  Pulmonary:     Effort: Pulmonary effort is normal.     Breath sounds: Normal breath sounds.  Abdominal:     General: Bowel sounds are normal.     Palpations: Abdomen is soft.  Skin:    General: Skin is warm and dry.  Capillary Refill: Capillary refill takes less than 2 seconds.  Neurological:     Mental Status: She is alert and oriented to person, place, and time.  Psychiatric:        Mood and Affect: Mood normal.        Behavior: Behavior normal.     ED Results / Procedures / Treatments   Labs (all labs ordered are listed, but only abnormal results are displayed) Labs Reviewed  BASIC METABOLIC PANEL - Abnormal; Notable for the following components:      Result Value   Glucose, Bld 115 (*)    Calcium 8.7 (*)    GFR, Estimated 59 (*)    All other components within normal limits  CBC - Abnormal; Notable for the following components:   Hemoglobin 11.5 (*)    All other components within normal limits  URINALYSIS, ROUTINE W REFLEX MICROSCOPIC  TROPONIN I (HIGH SENSITIVITY)  TROPONIN I (HIGH SENSITIVITY)    EKG EKG Interpretation  Date/Time:  Wednesday December 12 2022 13:24:26 EST Ventricular Rate:  70 PR Interval:  171 QRS Duration: 90 QT Interval:  399 QTC Calculation: 431 R Axis:   40 Text Interpretation: Sinus rhythm Baseline wander in lead(s) V2 Confirmed by Garnette Gunner [phone]) on 12/12/2022 4:03:58 PM  Radiology DG Chest 2 View  Result Date: 12/12/2022 CLINICAL DATA:  Chest pain for 2 days EXAM: CHEST - 2 VIEW COMPARISON:  08/31/2022 FINDINGS: Interval TAVR. The heart size and mediastinal contours are within normal limits. Both lungs are clear. The visualized skeletal structures are unremarkable. IMPRESSION: No active cardiopulmonary disease. Electronically Signed   By: Merilyn Baba M.D.   On: 12/12/2022 14:08    Procedures Procedures    Medications Ordered in ED Medications - No data to display  ED Course/ Medical Decision Making/ A&P                             Medical Decision Making Amount and/or Complexity of Data Reviewed Labs: ordered. Radiology: ordered.   This patient is a 82 y.o. female  who presents to the ED for concern of chest pain last night which was sharp in nature, nonexertional, not associated with shortness of breath.  Patient reports that she has felt very fatigued for the last 2 days with some occasional shortness of breath, but denies significant change, cough, or respiratory distress in the emergency department today.  She reports that she had a TAVR procedure back in October, and has been to urology intermittently this month with diagnosis of UTI just about a week ago, she is still finishing some antibiotics at this time.   Differential diagnoses prior to evaluation: The emergent differential diagnosis includes, but is not limited to,  ACS, AAS, PE, Mallory-Weiss, Boerhaave's, Pneumonia, acute bronchitis, asthma or COPD exacerbation, anxiety, MSK pain or traumatic injury to the chest, acid reflux versus other . This is not an exhaustive differential.   Past Medical History /  Co-morbidities: Hypertension, hyperlipidemia, severe aortic stenosis formally status post TAVR procedure in October, previous dissection of femoral artery, recently diagnosed UTI  Additional history: Chart reviewed. Pertinent results include: Reviewed cath from just prior to TAVR procedure, patient with no significant blockage or stenosis at the time  Physical Exam: Physical exam performed. The pertinent findings include: Patient is overall well-appearing during her emergency department evaluation today, she did have some hypertension with blood pressure max 171/65.  She is not having any active chest  pain during her evaluation today.  She has no respiratory distress no accessory breath sounds.  Lab Tests/Imaging studies: I personally interpreted labs/imaging and the pertinent results include: Troponin negative x 1 in context of chest pain present last night and not since then.  CBC is unremarkable with stable to improved anemia, hemoglobin 11.5 today.  BMP notable for very mild hyperglycemia, glucose 115.Patsy Lager interpreted plain film chest x-ray which shows no acute intrathoracic abnormality.  I agree with the radiologist interpretation.  Cardiac monitoring: EKG obtained and interpreted by my attending physician which shows: Normal sinus rhythm   Disposition: After consideration of the diagnostic results and the patients response to treatment, I feel that patient with no signs of ischemic heart disease or other acute cardiac abnormality today, no obvious explanation for chest pain, discussed musculoskeletal versus GERD versus other, encourage close follow-up with cardiology, but do not think that additional workup in the emergency department is warranted today based on her description of symptoms and reassuring workup.Marland Kitchen   emergency department workup does not suggest an emergent condition requiring admission or immediate intervention beyond what has been performed at this time. The plan is: as  above. The patient is safe for discharge and has been instructed to return immediately for worsening symptoms, change in symptoms or any other concerns.  Final Clinical Impression(s) / ED Diagnoses Final diagnoses:  Chest pain, unspecified type  Fatigue, unspecified type    Rx / DC Orders ED Discharge Orders     None         Dorien Chihuahua 12/12/22 1753    Carmin Muskrat, MD 12/17/22 2340

## 2022-12-12 NOTE — ED Triage Notes (Signed)
BIB family POV. Pt states left sided chest pain started last night was sharp at times and would come and go. Pt states she has felt very fatigued for 2 days with SOB. Denies n/v/d. Denies pain at time of arrival to ED.

## 2022-12-12 NOTE — Discharge Instructions (Signed)
Please follow-up with your cardiologist, and primary care provider to discuss your generalized fatigue, there is no evidence of heart attack or other ischemic chest pain on your workup today, and your lab work looked overall unremarkable.  If you have return of chest pain or worsening chest pain please return to the emergency department for further evaluation

## 2022-12-14 ENCOUNTER — Emergency Department (HOSPITAL_BASED_OUTPATIENT_CLINIC_OR_DEPARTMENT_OTHER): Payer: Medicare HMO

## 2022-12-14 ENCOUNTER — Encounter (HOSPITAL_COMMUNITY): Payer: Medicare HMO

## 2022-12-14 ENCOUNTER — Other Ambulatory Visit: Payer: Self-pay

## 2022-12-14 ENCOUNTER — Encounter (HOSPITAL_BASED_OUTPATIENT_CLINIC_OR_DEPARTMENT_OTHER): Payer: Self-pay | Admitting: Urology

## 2022-12-14 ENCOUNTER — Telehealth (HOSPITAL_COMMUNITY): Payer: Self-pay | Admitting: *Deleted

## 2022-12-14 ENCOUNTER — Inpatient Hospital Stay (HOSPITAL_BASED_OUTPATIENT_CLINIC_OR_DEPARTMENT_OTHER)
Admission: EM | Admit: 2022-12-14 | Discharge: 2022-12-17 | DRG: 690 | Disposition: A | Discharge status: Home / Self Care | Payer: Medicare HMO | Attending: Internal Medicine | Admitting: Internal Medicine

## 2022-12-14 DIAGNOSIS — E7800 Pure hypercholesterolemia, unspecified: Secondary | ICD-10-CM | POA: Diagnosis present

## 2022-12-14 DIAGNOSIS — Z952 Presence of prosthetic heart valve: Secondary | ICD-10-CM

## 2022-12-14 DIAGNOSIS — E785 Hyperlipidemia, unspecified: Secondary | ICD-10-CM | POA: Diagnosis present

## 2022-12-14 DIAGNOSIS — N390 Urinary tract infection, site not specified: Secondary | ICD-10-CM | POA: Diagnosis present

## 2022-12-14 DIAGNOSIS — Z9049 Acquired absence of other specified parts of digestive tract: Secondary | ICD-10-CM

## 2022-12-14 DIAGNOSIS — Z7982 Long term (current) use of aspirin: Secondary | ICD-10-CM

## 2022-12-14 DIAGNOSIS — N12 Tubulo-interstitial nephritis, not specified as acute or chronic: Secondary | ICD-10-CM | POA: Diagnosis not present

## 2022-12-14 DIAGNOSIS — K5900 Constipation, unspecified: Secondary | ICD-10-CM | POA: Diagnosis present

## 2022-12-14 DIAGNOSIS — R079 Chest pain, unspecified: Secondary | ICD-10-CM | POA: Diagnosis present

## 2022-12-14 DIAGNOSIS — E876 Hypokalemia: Secondary | ICD-10-CM | POA: Diagnosis not present

## 2022-12-14 DIAGNOSIS — R319 Hematuria, unspecified: Secondary | ICD-10-CM | POA: Diagnosis present

## 2022-12-14 DIAGNOSIS — Z79899 Other long term (current) drug therapy: Secondary | ICD-10-CM

## 2022-12-14 DIAGNOSIS — N1330 Unspecified hydronephrosis: Secondary | ICD-10-CM | POA: Diagnosis present

## 2022-12-14 DIAGNOSIS — E278 Other specified disorders of adrenal gland: Secondary | ICD-10-CM | POA: Diagnosis present

## 2022-12-14 DIAGNOSIS — I10 Essential (primary) hypertension: Secondary | ICD-10-CM | POA: Diagnosis present

## 2022-12-14 DIAGNOSIS — N134 Hydroureter: Secondary | ICD-10-CM | POA: Diagnosis not present

## 2022-12-14 DIAGNOSIS — N3941 Urge incontinence: Secondary | ICD-10-CM | POA: Diagnosis not present

## 2022-12-14 DIAGNOSIS — N8110 Cystocele, unspecified: Secondary | ICD-10-CM | POA: Diagnosis not present

## 2022-12-14 DIAGNOSIS — D500 Iron deficiency anemia secondary to blood loss (chronic): Secondary | ICD-10-CM | POA: Diagnosis present

## 2022-12-14 DIAGNOSIS — Z66 Do not resuscitate: Secondary | ICD-10-CM | POA: Diagnosis present

## 2022-12-14 DIAGNOSIS — Z8249 Family history of ischemic heart disease and other diseases of the circulatory system: Secondary | ICD-10-CM | POA: Diagnosis not present

## 2022-12-14 DIAGNOSIS — N136 Pyonephrosis: Principal | ICD-10-CM | POA: Diagnosis present

## 2022-12-14 DIAGNOSIS — K219 Gastro-esophageal reflux disease without esophagitis: Secondary | ICD-10-CM | POA: Diagnosis present

## 2022-12-14 DIAGNOSIS — N814 Uterovaginal prolapse, unspecified: Secondary | ICD-10-CM | POA: Diagnosis present

## 2022-12-14 LAB — COMPREHENSIVE METABOLIC PANEL
ALT: 18 U/L (ref 0–44)
AST: 21 U/L (ref 15–41)
Albumin: 3 g/dL — ABNORMAL LOW (ref 3.5–5.0)
Alkaline Phosphatase: 61 U/L (ref 38–126)
Anion gap: 7 (ref 5–15)
BUN: 16 mg/dL (ref 8–23)
CO2: 26 mmol/L (ref 22–32)
Calcium: 8.4 mg/dL — ABNORMAL LOW (ref 8.9–10.3)
Chloride: 104 mmol/L (ref 98–111)
Creatinine, Ser: 0.96 mg/dL (ref 0.44–1.00)
GFR, Estimated: 59 mL/min — ABNORMAL LOW (ref >60)
Glucose, Bld: 117 mg/dL — ABNORMAL HIGH (ref 70–99)
Potassium: 4 mmol/L (ref 3.5–5.1)
Sodium: 137 mmol/L (ref 135–145)
Total Bilirubin: 0.6 mg/dL (ref 0.3–1.2)
Total Protein: 6.8 g/dL (ref 6.5–8.1)

## 2022-12-14 LAB — CBC WITH DIFFERENTIAL/PLATELET
Abs Immature Granulocytes: 0.06 10*3/uL (ref 0.00–0.07)
Basophils Absolute: 0 10*3/uL (ref 0.0–0.1)
Basophils Relative: 0 %
Eosinophils Absolute: 0.1 10*3/uL (ref 0.0–0.5)
Eosinophils Relative: 0 %
HCT: 36.2 % (ref 36.0–46.0)
Hemoglobin: 11.2 g/dL — ABNORMAL LOW (ref 12.0–15.0)
Immature Granulocytes: 0 %
Lymphocytes Relative: 8 %
Lymphs Abs: 1.2 10*3/uL (ref 0.7–4.0)
MCH: 27.3 pg (ref 26.0–34.0)
MCHC: 30.9 g/dL (ref 30.0–36.0)
MCV: 88.1 fL (ref 80.0–100.0)
Monocytes Absolute: 0.8 10*3/uL (ref 0.1–1.0)
Monocytes Relative: 6 %
Neutro Abs: 12.9 10*3/uL — ABNORMAL HIGH (ref 1.7–7.7)
Neutrophils Relative %: 86 %
Platelets: 332 10*3/uL (ref 150–400)
RBC: 4.11 MIL/uL (ref 3.87–5.11)
RDW: 13.2 % (ref 11.5–15.5)
WBC: 15 10*3/uL — ABNORMAL HIGH (ref 4.0–10.5)
nRBC: 0 % (ref 0.0–0.2)

## 2022-12-14 LAB — URINALYSIS, ROUTINE W REFLEX MICROSCOPIC
Glucose, UA: NEGATIVE mg/dL
Ketones, ur: NEGATIVE mg/dL
Nitrite: POSITIVE — AB
Protein, ur: 100 mg/dL — AB
Specific Gravity, Urine: >=1.03 (ref 1.005–1.030)
pH: 6.5 (ref 5.0–8.0)

## 2022-12-14 LAB — LIPASE, BLOOD: Lipase: 26 U/L (ref 11–51)

## 2022-12-14 LAB — URINALYSIS, MICROSCOPIC (REFLEX)
RBC / HPF: >50 RBC/hpf (ref 0–5)
WBC, UA: >50 WBC/hpf (ref 0–5)

## 2022-12-14 MED ORDER — SODIUM CHLORIDE 0.9 % IV BOLUS
1000.0000 mL | Freq: Once | INTRAVENOUS | Status: AC
  Administered 2022-12-14: 1000 mL via INTRAVENOUS

## 2022-12-14 MED ORDER — ENOXAPARIN SODIUM 40 MG/0.4ML IJ SOSY
40.0000 mg | PREFILLED_SYRINGE | INTRAMUSCULAR | Status: DC
  Administered 2022-12-15 – 2022-12-16 (×2): 40 mg via SUBCUTANEOUS
  Filled 2022-12-14 (×2): qty 0.4

## 2022-12-14 MED ORDER — OXYCODONE HCL 5 MG PO TABS: 5.0000 mg | ORAL_TABLET | ORAL | Status: DC | PRN

## 2022-12-14 MED ORDER — SODIUM CHLORIDE 0.9 % IV SOLN
1.0000 g | Freq: Once | INTRAVENOUS | Status: AC
  Administered 2022-12-14: 1 g via INTRAVENOUS
  Filled 2022-12-14: qty 10

## 2022-12-14 MED ORDER — SODIUM CHLORIDE 0.9 % IV SOLN
1.0000 g | INTRAVENOUS | Status: DC
  Administered 2022-12-15 – 2022-12-16 (×2): 1 g via INTRAVENOUS
  Filled 2022-12-14 (×2): qty 10

## 2022-12-14 MED ORDER — IOHEXOL 300 MG/ML  SOLN
100.0000 mL | Freq: Once | INTRAMUSCULAR | Status: AC | PRN
  Administered 2022-12-14: 100 mL via INTRAVENOUS

## 2022-12-14 MED ORDER — ACETAMINOPHEN 325 MG PO TABS: 650.0000 mg | ORAL_TABLET | Freq: Four times a day (QID) | ORAL | Status: DC | PRN

## 2022-12-14 MED ORDER — SENNOSIDES-DOCUSATE SODIUM 8.6-50 MG PO TABS: 1.0000 | ORAL_TABLET | Freq: Every evening | ORAL | Status: DC | PRN

## 2022-12-14 MED ORDER — ATORVASTATIN CALCIUM 20 MG PO TABS
20.0000 mg | ORAL_TABLET | Freq: Every day | ORAL | Status: DC
  Administered 2022-12-14 – 2022-12-16 (×3): 20 mg via ORAL
  Filled 2022-12-14 (×3): qty 1

## 2022-12-14 MED ORDER — PANTOPRAZOLE SODIUM 40 MG PO TBEC
40.0000 mg | DELAYED_RELEASE_TABLET | Freq: Every day | ORAL | Status: DC
  Administered 2022-12-15 – 2022-12-17 (×3): 40 mg via ORAL
  Filled 2022-12-14 (×3): qty 1

## 2022-12-14 MED ORDER — ACETAMINOPHEN 650 MG RE SUPP: 650.0000 mg | Freq: Four times a day (QID) | RECTAL | Status: DC | PRN

## 2022-12-14 MED ORDER — ASPIRIN 81 MG PO TBEC
81.0000 mg | DELAYED_RELEASE_TABLET | Freq: Every day | ORAL | Status: DC
  Administered 2022-12-15 – 2022-12-17 (×3): 81 mg via ORAL
  Filled 2022-12-14 (×3): qty 1

## 2022-12-14 MED ORDER — ONDANSETRON HCL 4 MG PO TABS: 4.0000 mg | ORAL_TABLET | Freq: Four times a day (QID) | ORAL | Status: DC | PRN

## 2022-12-14 MED ORDER — ONDANSETRON HCL 4 MG/2ML IJ SOLN: 4.0000 mg | Freq: Four times a day (QID) | INTRAMUSCULAR | Status: DC | PRN

## 2022-12-14 MED ORDER — LISINOPRIL 20 MG PO TABS
20.0000 mg | ORAL_TABLET | Freq: Every day | ORAL | Status: DC
  Administered 2022-12-15 – 2022-12-17 (×3): 20 mg via ORAL
  Filled 2022-12-14 (×3): qty 1

## 2022-12-14 MED ORDER — LACTATED RINGERS IV SOLN: INTRAVENOUS | Status: AC

## 2022-12-14 NOTE — Hospital Course (Signed)
Traci [name] is a 82 y.o. female with medical history significant for severe AS s/p TAVR, HTN, HLD who is admitted with left-sided pyelonephritis and hydronephrosis failing outpatient antibiotics.

## 2022-12-14 NOTE — ED Notes (Signed)
[facility] for transport to [facility] and while I was talking to [place] the bed was gone and it went back to ready to plan

## 2022-12-14 NOTE — ED Notes (Signed)
[facility] on site to transport patient to [facility] 

## 2022-12-14 NOTE — ED Notes (Signed)
ED Provider at bedside. 

## 2022-12-14 NOTE — ED Provider Notes (Signed)
[facility] [facility] Provider Note   CSN: [id number] Arrival date & time: 12/14/22  1311     History  Chief Complaint  Patient presents with   Dysuria   Constipation    Traci [name] is a 82 y.o. female.  HPI 82 year old female presents with hematuria and dysuria and abdominal pain.  She had her pessary removed about 3 weeks ago because she did not tolerate it and was having a lot of urinary issues.  She now has a recurrent bladder prolapse.  She has been dealing with on and off infections and following up with [facility].  Just came off an antibiotic 2 days ago but does not know the name.  However this morning started having dysuria and recurrent hematuria.  No frequency or urgency.  Has also had constipation for a week with small amount of gas.  No vomiting but is having left-sided abdominal pain and bilateral flank/low back pain.  No fevers.  Pain right now is not too bad.  Home Medications Prior to Admission medications   Medication Sig Start Date End Date Taking? Authorizing Provider  amoxicillin (AMOXIL) 500 MG tablet Take 4 tablets by mouth 1 hour before dental procedures and cleanings 09/12/22   Tommie Raymond, NP  Artificial Tear Solution (SOOTHE XP) SOLN Place 1 drop into both eyes daily.    [provider]  aspirin EC 81 MG tablet Take 81 mg by mouth daily. Swallow whole.    [provider]  atorvastatin (LIPITOR) 20 MG tablet Take 20 mg by mouth at bedtime.    [provider]  cholecalciferol (VITAMIN D) 1000 UNITS tablet Take 1,000 Units by mouth daily.    [provider]  lisinopril (ZESTRIL) 20 MG tablet TAKE 1 TABLET BY MOUTH EVERY DAY 09/18/22   Croitoru, [name], MD  pantoprazole (PROTONIX) 40 MG tablet Take 40 mg by mouth daily.      [provider]  traMADol (ULTRAM) 50 MG tablet Take 1 tablet (50 mg total) by mouth every 6 (six) hours as needed. Patient not taking: Reported on  10/16/2022 09/06/22 09/06/23  Eileen Stanford, PA-C      Allergies    Patient has no known allergies.    Review of Systems   Review of Systems  Constitutional:  Negative for fever.  Gastrointestinal:  Positive for abdominal pain and constipation. Negative for diarrhea and vomiting.  Genitourinary:  Positive for dysuria and hematuria.    Physical Exam Updated Vital Signs BP 98/79 (BP Location: Right Arm)   Pulse 74   Temp 97.9 F (36.6 C) (Oral)   Resp 16   Ht '5\' 7"'$  (1.702 m)   Wt 86.6 kg   SpO2 94%   BMI 29.90 kg/m  Physical Exam Vitals and nursing note reviewed.  Constitutional:      Appearance: She is well-developed.  HENT:     Head: Normocephalic and atraumatic.  Cardiovascular:     Rate and Rhythm: Normal rate and regular rhythm.     Heart sounds: Murmur heard.  Pulmonary:     Effort: Pulmonary effort is normal.     Breath sounds: Normal breath sounds.  Abdominal:     Palpations: Abdomen is soft.     Tenderness: There is abdominal tenderness (mild) in the right lower quadrant, suprapubic area and left lower quadrant. There is no right CVA tenderness or left CVA tenderness.  Genitourinary:    Comments: bladder prolapse No fecal impaction or  gross blood on DRE Skin:    General: Skin is warm and dry.  Neurological:     Mental Status: She is alert.     ED Results / Procedures / Treatments   Labs (all labs ordered are listed, but only abnormal results are displayed) Labs Reviewed  URINALYSIS, ROUTINE W REFLEX MICROSCOPIC - Abnormal; Notable for the following components:      Result Value   APPearance CLOUDY (*)    Hgb urine dipstick MODERATE (*)    Bilirubin Urine SMALL (*)    Protein, ur 100 (*)    Nitrite POSITIVE (*)    Leukocytes,Ua LARGE (*)    All other components within normal limits  URINALYSIS, MICROSCOPIC (REFLEX) - Abnormal; Notable for the following components:   Bacteria, UA MANY (*)    All other components within normal limits  URINE  CULTURE    EKG None  Radiology No results found.  Procedures Procedures    Medications Ordered in ED Medications  sodium chloride 0.9 % bolus 1,000 mL (has no administration in time range)  cefTRIAXone (ROCEPHIN) 1 g in sodium chloride 0.9 % 100 mL IVPB (has no administration in time range)    ED Course/ Medical Decision Making/ A&P                             Medical Decision Making Amount and/or Complexity of Data Reviewed Labs: ordered. Radiology: ordered.  Risk Prescription drug management.   Patient's UA is consistent with UTI.  Her initial labs show a leukocytosis but no other labs are back.  Based on presentation will get CT and give a dose of IV Rocephin.  Care transferred to Dr. Billy Fischer.        Final Clinical Impression(s) / ED Diagnoses Final diagnoses:  None    Rx / DC Orders ED Discharge Orders     None         Sherwood Gambler, MD 12/14/22 1538

## 2022-12-14 NOTE — Telephone Encounter (Signed)
Spoke with son Carrisa, Keller is having blood in her urine she is going back to the ED to be evaluated. Will cancel appointment for cardiac rehab.Harrell Gave RN BSN

## 2022-12-14 NOTE — Plan of Care (Signed)
Discuss and review plan of care with patient/family  

## 2022-12-14 NOTE — Progress Notes (Signed)
Received a call from [facility] ED for transferring this patient to [facility] for further management. 82 year old female with history of hyperlipidemia, GERD, HTN, severe aortic stenosis status post TAVR manage to the hospital with dysuria and abdominal pain.  She was recently seen by [facility] and was placed on antibiotics but does not remember the name of this.  She is also had some constipation during this time.  In the ER she was found to have leukocytosis with signs of urinary tract infection.  CT scan suggested left-sided hydronephrosis with wall thickening.  Patient was given IV fluids and IV Rocephin.  Medical team was requested to admit the patient.  Patient slightly hypertensive otherwise no other hemodynamic instability.  I requested ED provider to consult [facility].  I will place the admit orders to telemetry for the patient.  Gerlean Ren MD [facility]

## 2022-12-14 NOTE — ED Provider Notes (Signed)
  Physical Exam  BP 98/79 (BP Location: Right Arm)   Pulse 74   Temp 97.9 F (36.6 C) (Oral)   Resp 16   Ht '5\' 7"'$  (1.702 m)   Wt 86.6 kg   SpO2 94%   BMI 29.90 kg/m   Physical Exam  Procedures  Procedures  ED Course / MDM    Medical Decision Making Amount and/or Complexity of Data Reviewed Labs: ordered. Radiology: ordered.  Risk Prescription drug management. Decision regarding hospitalization.   Just came off of antibiotics 2 days ago.  UTI, has progressed despite oral antibiotics.  Please see Dr. Tyron Russell note for prior hx and physical.  Personally reviewed labs which show leukocytosis, no significant electrolyte abnormalities. UA consistent with UTI.  CT completed shows left hydronephrosis, hydroureter, large dystocele, left adrenal nodule (rec 1 year follow up), periumbilical fat containing hernia.   Will admit to hospital for pyelonephritis treatment given failure of outpt abx.  Contacted Urology who is available as needed regarding hydronephrosis.          Gareth Morgan, MD 12/15/22 2249

## 2022-12-14 NOTE — ED Notes (Signed)
ED TO INPATIENT HANDOFF REPORT  ED Nurse Name and Phone #: Leo Rod [facility] Paramedic [phone]  S Name/Age/Gender Trilby Drummer 82 y.o. female Room/Bed: MH09/MH09  Code Status   Code Status: Prior  Home/SNF/Other Home Patient oriented to: self, place, time, and situation Is this baseline? Yes   Triage Complete: Triage complete  Chief Complaint Pyelonephritis of left kidney [N12]  Triage Note Pt states concern for kidney problems Pain with urinating and noted blood in urine  Pt also reports uterine prolapse Pt c/o constipation with last BM 1 week ago  Has not taken an laxatives or other measures for BM   Finished antibiotics wed for UTI    Allergies No Known Allergies  Level of Care/Admitting Diagnosis ED Disposition     ED Disposition  Admit   Condition  --   Comment  Hospital Area: [place] [[id number]]  Level of Care: Telemetry [5]  Admit to tele based on following criteria: Complex arrhythmia (Bradycardia/Tachycardia)  May [facility] patient to [facility] or [facility] if equivalent level of care is available:: Yes  Interfacility transfer: Yes  Covid Evaluation: Asymptomatic - no recent exposure (last 10 days) testing not required  Diagnosis: Pyelonephritis of left kidney [[id number]]  Admitting Physician: Gerlean Ren [facility] [[id number]]  Attending Physician: Gerlean Ren [facility] [[id number]]  Certification:: I certify this patient will need inpatient services for at least 2 midnights  Estimated Length of Stay: 3          B Medical/Surgery History Past Medical History:  Diagnosis Date   GERD (gastroesophageal reflux disease)    High cholesterol    Hypertension    Irregular heart rate    S/P TAVR (transcatheter aortic valve replacement) 09/04/2022   s/p TAVR with a 81m Edwards S3UR via the TF approach by Dr. [name]& Dr. [name]  Past Surgical History:  Procedure Laterality Date   APPLICATION OF WOUND VAC Right 09/04/2022    Procedure: APPLICATION OF WOUND VAC;  Surgeon: [name] MD;  [place]  Service: Vascular;  Laterality: Right;   CARDIAC CATHETERIZATION     CHOLECYSTECTOMY     DILATION AND CURETTAGE OF UTERUS     ENDARTERECTOMY FEMORAL Right 09/04/2022   Procedure: ENDARTERECTOMY FEMORAL;  Surgeon: [name] MD;  [place] [facility]  Service: Vascular;  Laterality: Right;   GROIN DISSECTION Right 09/04/2022   Procedure: GVirl SonEXPLORATION;  Surgeon: [name] MD;  [place] [facility]  Service: Vascular;  Laterality: Right;   INTRAOPERATIVE TRANSTHORACIC ECHOCARDIOGRAM N/A 09/04/2022   Procedure: INTRAOPERATIVE TRANSTHORACIC ECHOCARDIOGRAM;  Surgeon: [name] MD;  [place] [facility]  Service: Open Heart Surgery;  Laterality: N/A;   PATCH ANGIOPLASTY Right 09/04/2022   Procedure: BOVINE PATCH ANGIOPLASTY;  Surgeon: [name] MD;  [facility]  Service: Vascular;  Laterality: Right;   RIGHT/LEFT HEART CATH AND CORONARY ANGIOGRAPHY N/A 06/20/2022   Procedure: RIGHT/LEFT HEART CATH AND CORONARY ANGIOGRAPHY;  Surgeon: MBurnell Blanks MD;  [facility];  Service: Cardiovascular;  Laterality: N/A;   TRANSCATHETER AORTIC VALVE REPLACEMENT, TRANSFEMORAL N/A 09/04/2022   Procedure: Transcatheter Aortic Valve Replacement, Transfemoral;  Surgeon: [name] MD;  [place] [facility]  Service: Open Heart Surgery;  Laterality: N/A;   TUBAL LIGATION     ULTRASOUND GUIDANCE FOR VASCULAR ACCESS Bilateral 09/04/2022   Procedure: ULTRASOUND GUIDANCE FOR VASCULAR ACCESS;  Surgeon: [name] MD;  [place]  Service: Open Heart Surgery;  Laterality: Bilateral;  A IV Location/Drains/Wounds Patient Lines/Drains/Airways Status     Active Line/Drains/Airways     Name Placement date Placement time Site Days   Peripheral IV 12/14/22 20 G Left Antecubital 12/14/22  1436  Antecubital  less than 1   Negative Pressure Wound  Therapy Groin Right 09/04/22  1503  --  101            Intake/Output Last 24 hours  Intake/Output Summary (Last 24 hours) at 12/14/2022 1752 Last data filed at 12/14/2022 1613 Gross per 24 hour  Intake 1100.76 ml  Output --  Net 1100.76 ml    Labs/Imaging Results for orders placed or performed during the hospital encounter of 12/14/22 (from the past 48 hour(s))  Urinalysis, Routine w reflex microscopic -Urine, Clean Catch     Status: Abnormal   Collection Time: 12/14/22  1:26 PM  Result Value Ref Range   Color, Urine YELLOW YELLOW   APPearance CLOUDY (A) CLEAR   Specific Gravity, Urine >=1.030 1.005 - 1.030   pH 6.5 5.0 - 8.0   Glucose, UA NEGATIVE NEGATIVE mg/dL   Hgb urine dipstick MODERATE (A) NEGATIVE   Bilirubin Urine SMALL (A) NEGATIVE   Ketones, ur NEGATIVE NEGATIVE mg/dL   Protein, ur 100 (A) NEGATIVE mg/dL   Nitrite POSITIVE (A) NEGATIVE   Leukocytes,Ua LARGE (A) NEGATIVE    Comment: Performed at [facility], [address], [place], [place] [address]  Urinalysis, Microscopic (reflex)     Status: Abnormal   Collection Time: 12/14/22  1:26 PM  Result Value Ref Range   RBC / HPF >50 0 - 5 RBC/hpf   WBC, UA >50 0 - 5 WBC/hpf   Bacteria, UA MANY (A) NONE SEEN   Squamous Epithelial / HPF 0-5 0 - 5 /HPF    Comment: Performed at [facility], [address] [place], [place] [address]  Comprehensive metabolic panel     Status: Abnormal   Collection Time: 12/14/22  2:39 PM  Result Value Ref Range   Sodium 137 135 - 145 mmol/L   Potassium 4.0 3.5 - 5.1 mmol/L   Chloride 104 98 - 111 mmol/L   CO2 26 22 - 32 mmol/L   Glucose, Bld 117 (H) 70 - 99 mg/dL    Comment: Glucose reference range applies only to samples taken after fasting for at least 8 hours.   BUN 16 8 - 23 mg/dL   Creatinine, Ser 0.96 0.44 - 1.00 mg/dL   Calcium 8.4 (L) 8.9 - 10.3 mg/dL   Total Protein 6.8 6.5 - 8.1 g/dL   Albumin 3.0 (L) 3.5 - 5.0 g/dL   AST 21 15 - 41 U/L   ALT  18 0 - 44 U/L   Alkaline Phosphatase 61 38 - 126 U/L   Total Bilirubin 0.6 0.3 - 1.2 mg/dL   GFR, Estimated 59 (L) >60 mL/min    Comment: (NOTE) Calculated using the CKD-EPI Creatinine Equation (2021)    Anion gap 7 5 - 15    Comment: Performed at [facility], [address] [place], [place] [address]  Lipase, blood     Status: None   Collection Time: 12/14/22  2:39 PM  Result Value Ref Range   Lipase 26 11 - 51 U/L    Comment: Performed at [facility], [address], [place], [place] [address]  CBC with Differential     Status: Abnormal   Collection Time: 12/14/22  2:39  PM  Result Value Ref Range   WBC 15.0 (H) 4.0 - 10.5 K/uL   RBC 4.11 3.87 - 5.11 MIL/uL   Hemoglobin 11.2 (L) 12.0 - 15.0 g/dL   HCT 36.2 36.0 - 46.0 %   MCV 88.1 80.0 - 100.0 fL   MCH 27.3 26.0 - 34.0 pg   MCHC 30.9 30.0 - 36.0 g/dL   RDW 13.2 11.5 - 15.5 %   Platelets 332 150 - 400 K/uL   nRBC 0.0 0.0 - 0.2 %   Neutrophils Relative % 86 %   Neutro Abs 12.9 (H) 1.7 - 7.7 K/uL   Lymphocytes Relative 8 %   Lymphs Abs 1.2 0.7 - 4.0 K/uL   Monocytes Relative 6 %   Monocytes Absolute 0.8 0.1 - 1.0 K/uL   Eosinophils Relative 0 %   Eosinophils Absolute 0.1 0.0 - 0.5 K/uL   Basophils Relative 0 %   Basophils Absolute 0.0 0.0 - 0.1 K/uL   Immature Granulocytes 0 %   Abs Immature Granulocytes 0.06 0.00 - 0.07 K/uL    Comment: Performed at [facility], [address] [place], [place] [address]   CT ABDOMEN PELVIS W CONTRAST  Result Date: 12/14/2022 CLINICAL DATA:  LEFT lower quadrant pain, UTI recurrent/complicated in a female, blood in urine, history of uterine prolapse, constipation EXAM: CT ABDOMEN AND PELVIS WITH CONTRAST TECHNIQUE: Multidetector CT imaging of the abdomen and pelvis was performed using the standard protocol following bolus administration of intravenous contrast. RADIATION DOSE REDUCTION: This exam was performed according to the departmental dose-optimization  program which includes automated exposure control, adjustment of the mA and/or kV according to patient size and/or use of iterative reconstruction technique. CONTRAST:  11m OMNIPAQUE IOHEXOL 300 MG/ML SOLN IV. No oral contrast. COMPARISON:  11/15/2022 FINDINGS: Lower chest: Scarring RIGHT middle lobe.  Post TAVR. Hepatobiliary: Multiple hepatic cysts largest lateral segment LEFT lobe 6.4 x 4.3 cm image 19. Post cholecystectomy. No biliary dilatation. Pancreas: Atrophic appearance Spleen: Normal appearance Adrenals/Urinary Tract: 13 x 9 mm LEFT adrenal nodule, measuring 34 HU. Adrenal glands otherwise normal appearance. 12 mm inferior pole LEFT renal cyst; no follow-up imaging recommended. Excreted contrast material and renal collecting systems bilaterally. No RIGHT renal mass. LEFT hydronephrosis and hydroureter are identified with wall thickening and enhancement of the renal pelvis and LEFT ureter throughout its length. Bladder is decompressed. Bladder wall appears thickened likely accentuated by decompression, and demonstrates significant descent in the pelvis consistent with cystocele. No obstructing ureteral calculus. Stomach/Bowel: Small hiatal hernia. Normal appendix. Scattered stool in colon. Diverticulosis of distal transverse through sigmoid colon without evidence of diverticulitis. Bowel loops otherwise unremarkable. Vascular/Lymphatic: Atherosclerotic calcifications aorta and iliac arteries without aneurysm. Reproductive: Uterus surgically absent. Nonvisualization of ovaries. Other: No free air or free fluid. Small periumbilical hernia containing fat. Musculoskeletal: Osseous demineralization. IMPRESSION: LEFT hydronephrosis and hydroureter with wall thickening and enhancement of the renal pelvis and LEFT ureter throughout its length, favoring urinary tract infection; recommend correlation with urinalysis. Bladder wall appears thickened likely accentuated by decompression, and demonstrates significant  descent in the pelvis consistent with large cystocele. Distal colonic diverticulosis without evidence of diverticulitis. Small hiatal hernia. Small periumbilical hernia containing fat. 13 x 9 mm LEFT adrenal nodule, measuring 34 HU; this is a probable adrenal adenoma and 1 year follow-up adrenal washout CT is recommended, presuming indicated based on patient age and comorbidities. Aortic Atherosclerosis (ICD10-I70.0). Electronically Signed   By: MLavonia DanaM.D.   On: 12/14/2022 15:43  Pending Labs Unresulted Labs (From admission, onward)     Start     Ordered   12/14/22 1429  Urine Culture (for pregnant, neutropenic or urologic patients or patients with an indwelling urinary catheter)  (Urine Labs)  Once,   URGENT       Question:  Indication  Answer:  Dysuria   12/14/22 1428            Vitals/Pain Today's Vitals   12/14/22 1321 12/14/22 1330 12/14/22 1348 12/14/22 1545  BP: 98/79 (!) 140/51  (!) 173/78  Pulse: 74 79  84  Resp: '16 16  16  '$ Temp: 97.9 F (36.6 C)   97.9 F (36.6 C)  TempSrc: Oral     SpO2: 94% 95%  96%  Weight:      Height:      PainSc:   4      Isolation Precautions No active isolations  Medications Medications  sodium chloride 0.9 % bolus 1,000 mL (0 mLs Intravenous Stopped 12/14/22 1613)  cefTRIAXone (ROCEPHIN) 1 g in sodium chloride 0.9 % 100 mL IVPB (0 g Intravenous Stopped 12/14/22 1549)  iohexol (OMNIPAQUE) 300 MG/ML solution 100 mL (100 mLs Intravenous Contrast Given 12/14/22 1504)    Mobility walks     Focused Assessments    R Recommendations: See Admitting Provider Note  Report given to:   Additional Notes:

## 2022-12-14 NOTE — ED Notes (Signed)
Patient transported to CT 

## 2022-12-14 NOTE — H&P (Signed)
History and Physical    Traci [name] HWE:[id number] DOB: 03/06/1941 DOA: 12/14/2022  PCP: Burnard Bunting, MD  Patient coming from: Home  I have personally briefly reviewed patient's old medical records in [place]  Chief Complaint: Dysuria  HPI: Traci [name] is a 82 y.o. female with medical history significant for severe AS s/p TAVR, HTN, HLD who presented to the ED for evaluation of hematuria and dysuria.  Patient states that she has been dealing with urinary symptoms over the last month.  [facility] follows [facility].  She says that she had a pessary placed a few weeks ago which did improve comfort but she was having issues with urinary incontinence therefore it was subsequently removed.    She was recently diagnosed with a UTI and completed a 7-week course of an unspecified antibiotic 2 days ago.  Despite antibiotics she has been having persistent dysuria and now has developed left flank pain.  She has had associated chills and diaphoresis.  Denies nausea or vomiting, chest pain, dyspnea.  She reports good urine output.  [facility] ED Course  Labs/Imaging on admission: I have personally reviewed following labs and imaging studies.  Initial vitals showed BP 140/51, pulse 79, RR 16, temp 97.9 F, SpO2 95% on room air.  Labs show WBC 15.0, hemoglobin 11.2, platelets 332,000, sodium 137, potassium 4.0, bicarb 26, BUN 16, creatinine 0.96, serum glucose 117, LFTs within normal limits, lipase 26.  UA showed 100 protein, positive nitrites, large leukocytes, >50 RBCs and WBCs, many bacteria on microscopy.  Urine culture in process.  CT abdomen/pelvis with contrast showed left hydronephrosis and hydroureter with wall thickening and enhancement of the renal pelvis and left ureter throughout its length favoring UTI.  Bladder wall appears thickened and large cystocele noted.  13 x 9 mm left adrenal nodule probable adrenal adenoma also noted.  Patient was given 1 L  normal saline and IV ceftriaxone.  The hospitalist service was consulted to admit for further evaluation and management.  Review of Systems: All systems reviewed and are negative except as documented in history of present illness above.   Past Medical History:  Diagnosis Date   GERD (gastroesophageal reflux disease)    High cholesterol    Hypertension    Irregular heart rate    S/P TAVR (transcatheter aortic valve replacement) 09/04/2022   s/p TAVR with a 93m Edwards S3UR via the TF approach by Dr. [name]& Dr. [name]   Past Surgical History:  Procedure Laterality Date   APPLICATION OF WOUND VAC Right 09/04/2022   Procedure: APPLICATION OF WOUND VAC;  Surgeon: [name] MD;  [place] [facility]  Service: Vascular;  Laterality: Right;   CARDIAC CATHETERIZATION     CHOLECYSTECTOMY     DILATION AND CURETTAGE OF UTERUS     ENDARTERECTOMY FEMORAL Right 09/04/2022   Procedure: ENDARTERECTOMY FEMORAL;  Surgeon: [name] MD;  [place] [facility]  Service: Vascular;  Laterality: Right;   GROIN DISSECTION Right 09/04/2022   Procedure: GVirl SonEXPLORATION;  Surgeon: [name] MD;  [place] [facility]  Service: Vascular;  Laterality: Right;   INTRAOPERATIVE TRANSTHORACIC ECHOCARDIOGRAM N/A 09/04/2022   Procedure: INTRAOPERATIVE TRANSTHORACIC ECHOCARDIOGRAM;  Surgeon: [name] MD;  [place]  Service: Open Heart Surgery;  Laterality: N/A;   PATCH ANGIOPLASTY Right 09/04/2022   Procedure: BOVINE PATCH ANGIOPLASTY;  Surgeon: [name] MD;  [facility]  Service: Vascular;  Laterality: Right;   RIGHT/LEFT HEART  CATH AND CORONARY ANGIOGRAPHY N/A 06/20/2022   Procedure: RIGHT/LEFT HEART CATH AND CORONARY ANGIOGRAPHY;  Surgeon: Burnell Blanks, MD;  Location: [place] [facility] LAB;  Service: Cardiovascular;  Laterality: N/A;   TRANSCATHETER AORTIC VALVE REPLACEMENT, TRANSFEMORAL N/A 09/04/2022   Procedure: Transcatheter Aortic  Valve Replacement, Transfemoral;  Surgeon: Sherren Mocha, MD;  Location: [place];  Service: Open Heart Surgery;  Laterality: N/A;   TUBAL LIGATION     ULTRASOUND GUIDANCE FOR VASCULAR ACCESS Bilateral 09/04/2022   Procedure: ULTRASOUND GUIDANCE FOR VASCULAR ACCESS;  Surgeon: Sherren Mocha, MD;  Location: [place];  Service: Open Heart Surgery;  Laterality: Bilateral;    Social History:  reports that she has never smoked. She has never been exposed to tobacco smoke. She has never used smokeless tobacco. She reports current alcohol use. She reports that she does not use drugs.  No Known Allergies  Family History  Problem Relation Age of Onset   Heart disease Father    Colon cancer Neg Hx      Prior to Admission medications   Medication Sig Start Date End Date Taking? Authorizing Provider  amoxicillin (AMOXIL) 500 MG tablet Take 4 tablets by mouth 1 hour before dental procedures and cleanings 09/12/22   Tommie Raymond, NP  Artificial Tear Solution (SOOTHE XP) SOLN Place 1 drop into both eyes daily.    [provider]  aspirin EC 81 MG tablet Take 81 mg by mouth daily. Swallow whole.    [provider]  atorvastatin (LIPITOR) 20 MG tablet Take 20 mg by mouth at bedtime.    [provider]  cholecalciferol (VITAMIN D) 1000 UNITS tablet Take 1,000 Units by mouth daily.    [provider]  lisinopril (ZESTRIL) 20 MG tablet TAKE 1 TABLET BY MOUTH EVERY DAY 09/18/22   Croitoru, [name], MD  pantoprazole (PROTONIX) 40 MG tablet Take 40 mg by mouth daily.      [provider]  traMADol (ULTRAM) 50 MG tablet Take 1 tablet (50 mg total) by mouth every 6 (six) hours as needed. Patient not taking: Reported on 10/16/2022 09/06/22 09/06/23  Eileen Stanford, PA-C    Physical Exam: Vitals:   12/14/22 1545 12/14/22 1806 12/14/22 2000 12/14/22 2152  BP: (!) 173/78 (!) 165/48 (!) 157/57 (!) 142/54  Pulse: 84 79 83 82  Resp: '16 16  16  '$ Temp: 97.9 F (36.6  C) 98.1 F (36.7 C)  99.1 F (37.3 C)  TempSrc:  Oral  Oral  SpO2: 96% 100% 93% 96%  Weight:      Height:       Constitutional: Resting in bed, NAD, calm, comfortable Eyes: EOMI, lids and conjunctivae normal ENMT: Mucous membranes are moist. Posterior pharynx clear of any exudate or lesions.Normal dentition.  Neck: normal, supple, no masses. Respiratory: clear to auscultation bilaterally, no wheezing, no crackles. Normal respiratory effort. No accessory muscle use.  Cardiovascular: Regular rate and rhythm, systolic murmur present. No extremity edema. 2+ pedal pulses. Abdomen: no tenderness, no masses palpated.  Musculoskeletal: no clubbing / cyanosis. No joint deformity upper and lower extremities. Good ROM, no contractures. Normal muscle tone.  Skin: no rashes, lesions, ulcers. No induration Neurologic: Sensation intact. Strength 5/5 in all 4.  Psychiatric: Normal judgment and insight. Alert and oriented x 3. Normal mood.   EKG: Not performed.  Assessment/Plan Principal Problem:   Pyelonephritis of left kidney Active Problems:   Essential hypertension   Hyperlipidemia   S/P TAVR (transcatheter aortic valve replacement)  Hydroureteronephrosis   Adrenal nodule (HCC)   Traci [name] is a 82 y.o. female with medical history significant for severe AS s/p TAVR, HTN, HLD who is admitted with left-sided pyelonephritis and hydronephrosis failing outpatient antibiotics.  Assessment and Plan: Left-sided pyelonephritis with hydroureteronephrosis: Persistent symptoms despite 1 week outpatient antibiotics.  CT without obvious obstruction.  Leukocytosis present. -Continue IV ceftriaxone -Follow urine culture -Continue IV fluids overnight  Hypertension: Continue lisinopril.  Severe aortic stenosis s/p TAVR 09/04/2022: Continue aspirin.  Hyperlipidemia: Continue atorvastatin.  Left adrenal nodule: 13 x 9 mm left adrenal nodule noted on CT imaging.  Per radiology read this is a  probable adrenal adenoma and 1 year follow-up adrenal washout CT is recommended.   DVT prophylaxis: enoxaparin (LOVENOX) injection 40 mg Start: 12/15/22 2200 Code Status: DNR, confirmed with patient on admission Family Communication: Son at bedside Disposition Plan: From home and likely discharge to home pending clinical progress Consults called: None Severity of Illness: The appropriate patient status for this patient is INPATIENT. Inpatient status is judged to be reasonable and necessary in order to provide the required intensity of service to ensure the patient's safety. The patient's presenting symptoms, physical exam findings, and initial radiographic and laboratory data in the context of their chronic comorbidities is felt to place them at high risk for further clinical deterioration. Furthermore, it is not anticipated that the patient will be medically stable for discharge from the hospital within 2 midnights of admission.   * I certify that at the point of admission it is my clinical judgment that the patient will require inpatient hospital care spanning beyond 2 midnights from the point of admission due to high intensity of service, high risk for further deterioration and high frequency of surveillance required.Zada Finders MD [facility]  If 7PM-7AM, please contact night-coverage [email or web address]  12/14/2022, 10:34 PM

## 2022-12-14 NOTE — ED Triage Notes (Signed)
Pt states concern for kidney problems Pain with urinating and noted blood in urine  Pt also reports uterine prolapse Pt c/o constipation with last BM 1 week ago  Has not taken an laxatives or other measures for BM   Finished antibiotics wed for UTI

## 2022-12-15 DIAGNOSIS — N12 Tubulo-interstitial nephritis, not specified as acute or chronic: Secondary | ICD-10-CM | POA: Diagnosis not present

## 2022-12-15 LAB — CBC
HCT: 31.4 % — ABNORMAL LOW (ref 36.0–46.0)
Hemoglobin: 9.7 g/dL — ABNORMAL LOW (ref 12.0–15.0)
MCH: 27.4 pg (ref 26.0–34.0)
MCHC: 30.9 g/dL (ref 30.0–36.0)
MCV: 88.7 fL (ref 80.0–100.0)
Platelets: 261 10*3/uL (ref 150–400)
RBC: 3.54 MIL/uL — ABNORMAL LOW (ref 3.87–5.11)
RDW: 13.6 % (ref 11.5–15.5)
WBC: 12 10*3/uL — ABNORMAL HIGH (ref 4.0–10.5)
nRBC: 0 % (ref 0.0–0.2)

## 2022-12-15 LAB — BASIC METABOLIC PANEL
Anion gap: 8 (ref 5–15)
BUN: 16 mg/dL (ref 8–23)
CO2: 25 mmol/L (ref 22–32)
Calcium: 8.1 mg/dL — ABNORMAL LOW (ref 8.9–10.3)
Chloride: 106 mmol/L (ref 98–111)
Creatinine, Ser: 0.94 mg/dL (ref 0.44–1.00)
GFR, Estimated: >60 mL/min (ref >60)
Glucose, Bld: 115 mg/dL — ABNORMAL HIGH (ref 70–99)
Potassium: 3.3 mmol/L — ABNORMAL LOW (ref 3.5–5.1)
Sodium: 139 mmol/L (ref 135–145)

## 2022-12-15 NOTE — Progress Notes (Signed)
Mobility Specialist - Progress Note   12/15/22 1143  Mobility  Activity Ambulated independently in hallway;Ambulated independently to bathroom  Level of Assistance Independent  Assistive Device None  Distance Ambulated (ft) 500 ft  Range of Motion/Exercises Active  Activity Response Tolerated well  Mobility Referral Yes  $Mobility charge 1 Mobility   Pt was found in bed and agreeable to ambulate. Had no complaints and at EOS returned to bed with all necessities in reach. Bed alarm on.   Ferd Hibbs Mobility Specialist

## 2022-12-15 NOTE — Consult Note (Signed)
Urology Consult Note   Requesting Attending Physician:  [name], MD Service Providing Consult: Urology  Consulting Attending: [name]   Reason for Consult:  Hydronephrosis  HPI: Traci [name] is seen in consultation for reasons noted above at the request of [name], MD for evaluation of hydronephrosis.  This is a 82 y.o. female with GERD, HTN, aortic valve replacement. She presented to ED 12/14/22 with hematuria and dysuria. She recently completed a course of antibiotics for presumed UTI (culture ultimately negative) but dysuria persisted and now endorses left flank pain. Afebrile and HDS. Labs notable for leukocytosis to 15. Creatinine at baseline. UA showing moderate blood, large leukocyte esterase, positive nitrite, and many bacteria. CT was obtained demonstrating left hydronephrosis with ureteral inflammation and a thickened bladder wall. Compared to her CT January 2024, there is no change in the level of hydro but the ureteral thickening is new, consistent with ascending urinary tract infection. There are no stones or masses.  Has had urinary symptoms for over 1 month for which she was recently fitted for a pessary for her known cystocele, unfortunately this resulted in significant incontinence and was discontinued. Jan 19th 2024 was seen in clinic for pessary removal and noted UTI symptoms. Was started on antibiotics empirically, urine culture ultimately no growth. In regards to her hydronephrosis, this has been noted on prior CT scans by Dr. [name]. Since her renal function has been normal, they elected for continued observation.   Past Medical History: Past Medical History:  Diagnosis Date   GERD (gastroesophageal reflux disease)    High cholesterol    Hypertension    Irregular heart rate    S/P TAVR (transcatheter aortic valve replacement) 09/04/2022   s/p TAVR with a 45m Edwards S3UR via the TF approach by Dr. [name]& Dr. [name]   Past Surgical History:  Past Surgical  History:  Procedure Laterality Date   APPLICATION OF WOUND VAC Right 09/04/2022   Procedure: APPLICATION OF WOUND VAC;  Surgeon: [name] MD;  [place]  Service: Vascular;  Laterality: Right;   CARDIAC CATHETERIZATION     CHOLECYSTECTOMY     DILATION AND CURETTAGE OF UTERUS     ENDARTERECTOMY FEMORAL Right 09/04/2022   Procedure: ENDARTERECTOMY FEMORAL;  Surgeon: [name] MD;  [place]  Service: Vascular;  Laterality: Right;   GROIN DISSECTION Right 09/04/2022   Procedure: GROIN EXPLORATION;  Surgeon: [name] MD;  [place] [facility]  Service: Vascular;  Laterality: Right;   INTRAOPERATIVE TRANSTHORACIC ECHOCARDIOGRAM N/A 09/04/2022   Procedure: INTRAOPERATIVE TRANSTHORACIC ECHOCARDIOGRAM;  Surgeon: [name] MD;  [place]  Service: Open Heart Surgery;  Laterality: N/A;   PATCH ANGIOPLASTY Right 09/04/2022   Procedure: BOVINE PATCH ANGIOPLASTY;  Surgeon: [name] MD;  [facility];  Service: Vascular;  Laterality: Right;   RIGHT/LEFT HEART CATH AND CORONARY ANGIOGRAPHY N/A 06/20/2022   Procedure: RIGHT/LEFT HEART CATH AND CORONARY ANGIOGRAPHY;  Surgeon: MBurnell Blanks MD;  [facility];  Service: Cardiovascular;  Laterality: N/A;   TRANSCATHETER AORTIC VALVE REPLACEMENT, TRANSFEMORAL N/A 09/04/2022   Procedure: Transcatheter Aortic Valve Replacement, Transfemoral;  Surgeon: [name] MD;  [place]  Service: Open Heart Surgery;  Laterality: N/A;   TUBAL LIGATION     ULTRASOUND GUIDANCE FOR VASCULAR ACCESS Bilateral 09/04/2022   Procedure: ULTRASOUND GUIDANCE FOR VASCULAR ACCESS;  Surgeon: [name] MD;  [place]  Service: Open Heart Surgery;  Laterality:  Bilateral;    Medication: Current Facility-Administered Medications  Medication Dose Route Frequency Provider Last Rate Last Admin   acetaminophen (TYLENOL) tablet 650 mg  650 mg Oral Q6H PRN  [name], MD       Or   acetaminophen (TYLENOL) suppository 650 mg  650 mg Rectal Q6H PRN [name], MD       aspirin EC tablet 81 mg  81 mg Oral Daily [name], MD       atorvastatin (LIPITOR) tablet 20 mg  20 mg Oral QHS [name], MD   20 mg at 12/14/22 2312   cefTRIAXone (ROCEPHIN) 1 g in sodium chloride 0.9 % 100 mL IVPB  1 g Intravenous Q24H [name], MD       enoxaparin (LOVENOX) injection 40 mg  40 mg Subcutaneous Q24H [name], MD       lactated ringers infusion   Intravenous Continuous [name], MD 100 mL/hr at 12/14/22 2353 New Bag at 12/14/22 2353   lisinopril (ZESTRIL) tablet 20 mg  20 mg Oral Daily [name], MD       ondansetron (ZOFRAN) tablet 4 mg  4 mg Oral Q6H PRN [name], MD       Or   ondansetron (ZOFRAN) injection 4 mg  4 mg Intravenous Q6H PRN [name], MD       oxyCODONE (Oxy IR/ROXICODONE) immediate release tablet 5 mg  5 mg Oral Q4H PRN [name], MD       pantoprazole (PROTONIX) EC tablet 40 mg  40 mg Oral Daily [name], MD       senna-docusate (Senokot-S) tablet 1 tablet  1 tablet Oral QHS PRN [name], MD        Allergies: No Known Allergies  Social History: Social History   Tobacco Use   Smoking status: Never    Passive exposure: Never   Smokeless tobacco: Never  Vaping Use   Vaping Use: Never used  Substance Use Topics   Alcohol use: Yes    Comment: OCCASIONALLY wine per pt   Drug use: No    Family History Family History  Problem Relation Age of Onset   Heart disease Father    Colon cancer Neg Hx     Review of Systems 10 systems were reviewed and are negative except as noted specifically in the HPI.  Objective   Vital signs in last 24 hours: BP (!) 148/57 (BP Location: Left Arm)   Pulse 78   Temp 98.3 F (36.8 C) (Oral)   Resp 18   Ht '5\' 7"'$  (1.702 m)   Wt 86.6 kg   SpO2 95%   BMI 29.90 kg/m   Physical Exam General: NAD, A&O, resting,  appropriate HEENT: [place]/AT, EOMI, MMM Pulmonary: Normal work of breathing Cardiovascular: HDS, adequate peripheral perfusion Abdomen: Soft, NTTP, nondistended. GU: voiding spontaneously, no CVA tenderness Extremities: warm and well perfused Neuro: Appropriate, no focal neurological deficits  Most Recent Labs: Lab Results  Component Value Date   WBC 12.0 (H) 12/15/2022   HGB 9.7 (L) 12/15/2022   HCT 31.4 (L) 12/15/2022   PLT 261 12/15/2022    Lab Results  Component Value Date   NA 139 12/15/2022   K 3.3 (L) 12/15/2022   CL 106 12/15/2022   CO2 25 12/15/2022   BUN 16 12/15/2022   CREATININE 0.94 12/15/2022   CALCIUM 8.1 (L) 12/15/2022   MG 1.7 09/05/2022  PHOS 3.0 02/14/2011    Lab Results  Component Value Date   INR 1.0 08/31/2022   APTT 30 02/10/2011     Urine Culture: '@[id number]'$ (laburin,org,[id number],r9621)@   IMAGING: CT ABDOMEN PELVIS W CONTRAST  Result Date: 12/14/2022 CLINICAL DATA:  LEFT lower quadrant pain, UTI recurrent/complicated in a female, blood in urine, history of uterine prolapse, constipation EXAM: CT ABDOMEN AND PELVIS WITH CONTRAST TECHNIQUE: Multidetector CT imaging of the abdomen and pelvis was performed using the standard protocol following bolus administration of intravenous contrast. RADIATION DOSE REDUCTION: This exam was performed according to the departmental dose-optimization program which includes automated exposure control, adjustment of the mA and/or kV according to patient size and/or use of iterative reconstruction technique. CONTRAST:  188m OMNIPAQUE IOHEXOL 300 MG/ML SOLN IV. No oral contrast. COMPARISON:  11/15/2022 FINDINGS: Lower chest: Scarring RIGHT middle lobe.  Post TAVR. Hepatobiliary: Multiple hepatic cysts largest lateral segment LEFT lobe 6.4 x 4.3 cm image 19. Post cholecystectomy. No biliary dilatation. Pancreas: Atrophic appearance Spleen: Normal appearance Adrenals/Urinary Tract: 13 x 9 mm LEFT adrenal nodule, measuring 34 HU.  Adrenal glands otherwise normal appearance. 12 mm inferior pole LEFT renal cyst; no follow-up imaging recommended. Excreted contrast material and renal collecting systems bilaterally. No RIGHT renal mass. LEFT hydronephrosis and hydroureter are identified with wall thickening and enhancement of the renal pelvis and LEFT ureter throughout its length. Bladder is decompressed. Bladder wall appears thickened likely accentuated by decompression, and demonstrates significant descent in the pelvis consistent with cystocele. No obstructing ureteral calculus. Stomach/Bowel: Small hiatal hernia. Normal appendix. Scattered stool in colon. Diverticulosis of distal transverse through sigmoid colon without evidence of diverticulitis. Bowel loops otherwise unremarkable. Vascular/Lymphatic: Atherosclerotic calcifications aorta and iliac arteries without aneurysm. Reproductive: Uterus surgically absent. Nonvisualization of ovaries. Other: No free air or free fluid. Small periumbilical hernia containing fat. Musculoskeletal: Osseous demineralization. IMPRESSION: LEFT hydronephrosis and hydroureter with wall thickening and enhancement of the renal pelvis and LEFT ureter throughout its length, favoring urinary tract infection; recommend correlation with urinalysis. Bladder wall appears thickened likely accentuated by decompression, and demonstrates significant descent in the pelvis consistent with large cystocele. Distal colonic diverticulosis without evidence of diverticulitis. Small hiatal hernia. Small periumbilical hernia containing fat. 13 x 9 mm LEFT adrenal nodule, measuring 34 HU; this is a probable adrenal adenoma and 1 year follow-up adrenal washout CT is recommended, presuming indicated based on patient age and comorbidities. Aortic Atherosclerosis (ICD10-I70.0). Electronically Signed   By: MLavonia DanaM.D.   On: 12/14/2022 15:43    ------  Assessment:  82y.o. female with stable hydronephrosis, cystocele, urinary  incontinence, presenting with signs and findings consistent with pyelonephritis/ureteritis. Pain has resolved following abx, renal function stable. Urine culture pending  Recommendations: - No intervention recommended at this time - Continue broad spectrum antibiotics - Would recommend 10-14 days culture directed therapy for complicated UTI once urine culture finalizes - Has follow-up appointment with Dr. PClaudia Desanctisalready scheduled   Thank you for this consult. Please contact the urology consult pager with any further questions/concerns.

## 2022-12-15 NOTE — Progress Notes (Signed)
PROGRESS NOTE    Traci [name]  HUD:[id number] DOB: [birth year] DOA: 12/14/2022 PCP: Burnard Bunting, MD     Brief Narrative:   Traci [name] is a 82 y.o. female with medical history significant for severe AS s/p TAVR, HTN, HLD who is admitted with left-sided pyelonephritis and hydronephrosis failing outpatient antibiotics.   Subjective:  Feeling better, denies pain, reports urine is less cloudy and less bloody  Assessment & Plan:  Principal Problem:   Pyelonephritis of left kidney Active Problems:   Essential hypertension   Hyperlipidemia   S/P TAVR (transcatheter aortic valve replacement)   Hydroureteronephrosis   Adrenal nodule (HCC)    Assessment and Plan:   Left-sided pyelonephritis with hydroureteronephrosis: Persistent symptoms despite 1 week outpatient antibiotics.  CT without obvious obstruction.  Leukocytosis is improving -Continue IV ceftriaxone, follow-up on urine culture -Urology input appreciated, conservative management with IV antibiotic, no urologic intervention needed  Blood loss anemia from gu loss Anemia of chronic disease Monitor hgb  Hypokalemia  Replace, recheck in the morning  Hypertension: Stable ,continue lisinopril.   Severe aortic stenosis s/p TAVR 09/04/2022: Continue aspirin.   Hyperlipidemia: Continue atorvastatin.     Left adrenal nodule: 13 x 9 mm left adrenal nodule noted on CT imaging.  Per radiology read this is a probable adrenal adenoma and 1 year follow-up adrenal washout CT is recommended.    : Body mass index is 29.9 kg/m.. :     I have Reviewed nursing notes, Vitals, pain scores, I/o's, Lab results and  imaging results since pt's last encounter, details please see discussion above  I ordered the following labs:  Unresulted Labs (From admission, onward)     Start     Ordered   12/16/22 0500  CBC with Differential/Platelet  Tomorrow morning,   R        12/15/22 1707   12/16/22 8588  Basic metabolic  panel  Tomorrow morning,   R        12/15/22 1707   12/16/22 0500  Magnesium  Tomorrow morning,   R        12/15/22 1707   12/14/22 1429  Urine Culture (for pregnant, neutropenic or urologic patients or patients with an indwelling urinary catheter)  (Urine Labs)  Once,   URGENT       Question:  Indication  Answer:  Dysuria   12/14/22 1428             DVT prophylaxis: enoxaparin (LOVENOX) injection 40 mg Start: 12/15/22 2200   Code Status:   Code Status: DNR  Family Communication: Patient Disposition:   Dispo: The patient is from: Home, lives herself, independent ADLs              Anticipated d/c is to: Home              Anticipated d/c date is: Clinically improving , DC telemetry ,awaiting for urine culture  Antimicrobials:   Anti-infectives (From admission, onward)    Start     Dose/Rate Route Frequency Ordered Stop   12/15/22 1400  cefTRIAXone (ROCEPHIN) 1 g in sodium chloride 0.9 % 100 mL IVPB        1 g 200 mL/hr over 30 Minutes Intravenous Every 24 hours 12/14/22 2203     12/14/22 1430  cefTRIAXone (ROCEPHIN) 1 g in sodium chloride 0.9 % 100 mL IVPB        1 g 200 mL/hr over 30 Minutes Intravenous  Once 12/14/22 1428 12/14/22 1549  Objective: Vitals:   12/15/22 0103 12/15/22 0645 12/15/22 1040 12/15/22 1451  BP: (!) 137/52 (!) 148/57 (!) 147/65 (!) 135/50  Pulse: 83 78 81 79  Resp: '18 18 17 '$ (!) 25  Temp: 98.7 F (37.1 C) 98.3 F (36.8 C) 98.2 F (36.8 C) 98.2 F (36.8 C)  TempSrc: Oral Oral Oral   SpO2: 95% 95% 97% 93%  Weight:      Height:        Intake/Output Summary (Last 24 hours) at 12/15/2022 1927 Last data filed at 12/15/2022 1809 Gross per 24 hour  Intake 815 ml  Output 700 ml  Net 115 ml   Filed Weights   12/14/22 1321  Weight: 86.6 kg    Examination:  General exam: alert, awake, communicative,calm, NAD Respiratory system: Clear to auscultation. Respiratory effort normal. Cardiovascular system:  RRR, + cardiac murmur   Gastrointestinal system: Abdomen is nondistended, soft and nontender.  Normal bowel sounds heard. Central nervous system: Alert and oriented. No focal neurological deficits. Extremities:  no edema Skin: No rashes, lesions or ulcers Psychiatry: Judgement and insight appear normal. Mood & affect appropriate.     Data Reviewed: I have personally reviewed  labs and visualized  imaging studies since the last encounter and formulate the plan        Scheduled Meds:  aspirin EC  81 mg Oral Daily   atorvastatin  20 mg Oral QHS   enoxaparin (LOVENOX) injection  40 mg Subcutaneous Q24H   lisinopril  20 mg Oral Daily   pantoprazole  40 mg Oral Daily   Continuous Infusions:  cefTRIAXone (ROCEPHIN)  IV 1 g (12/15/22 1406)     LOS: 1 day   Time spent:  81mns  FFlorencia Reasons MD PhD FACP [facility]  Available via Epic secure chat 7am-7pm for nonurgent issues Please page for urgent issues To page the attending provider between 7A-7P or the covering provider during after hours 7P-7A, please log into the web site [email or web address] and access using universal [place] password for that web site. If you do not have the password, please call the hospital operator.    12/15/2022, 7:27 PM

## 2022-12-16 DIAGNOSIS — N12 Tubulo-interstitial nephritis, not specified as acute or chronic: Secondary | ICD-10-CM | POA: Diagnosis not present

## 2022-12-16 LAB — CBC WITH DIFFERENTIAL/PLATELET
Abs Immature Granulocytes: 0.05 10*3/uL (ref 0.00–0.07)
Basophils Absolute: 0 10*3/uL (ref 0.0–0.1)
Basophils Relative: 0 %
Eosinophils Absolute: 0.1 10*3/uL (ref 0.0–0.5)
Eosinophils Relative: 1 %
HCT: 33.8 % — ABNORMAL LOW (ref 36.0–46.0)
Hemoglobin: 10.1 g/dL — ABNORMAL LOW (ref 12.0–15.0)
Immature Granulocytes: 1 %
Lymphocytes Relative: 16 %
Lymphs Abs: 1.5 10*3/uL (ref 0.7–4.0)
MCH: 27.2 pg (ref 26.0–34.0)
MCHC: 29.9 g/dL — ABNORMAL LOW (ref 30.0–36.0)
MCV: 90.9 fL (ref 80.0–100.0)
Monocytes Absolute: 0.8 10*3/uL (ref 0.1–1.0)
Monocytes Relative: 9 %
Neutro Abs: 7.2 10*3/uL (ref 1.7–7.7)
Neutrophils Relative %: 73 %
Platelets: 231 10*3/uL (ref 150–400)
RBC: 3.72 MIL/uL — ABNORMAL LOW (ref 3.87–5.11)
RDW: 13.7 % (ref 11.5–15.5)
WBC: 9.7 10*3/uL (ref 4.0–10.5)
nRBC: 0 % (ref 0.0–0.2)

## 2022-12-16 LAB — BASIC METABOLIC PANEL
Anion gap: 9 (ref 5–15)
BUN: 13 mg/dL (ref 8–23)
CO2: 26 mmol/L (ref 22–32)
Calcium: 8.3 mg/dL — ABNORMAL LOW (ref 8.9–10.3)
Chloride: 106 mmol/L (ref 98–111)
Creatinine, Ser: 0.98 mg/dL (ref 0.44–1.00)
GFR, Estimated: 58 mL/min — ABNORMAL LOW (ref >60)
Glucose, Bld: 113 mg/dL — ABNORMAL HIGH (ref 70–99)
Potassium: 3.1 mmol/L — ABNORMAL LOW (ref 3.5–5.1)
Sodium: 141 mmol/L (ref 135–145)

## 2022-12-16 LAB — MAGNESIUM: Magnesium: 1.9 mg/dL (ref 1.7–2.4)

## 2022-12-16 MED ORDER — VITAMIN D 25 MCG (1000 UNIT) PO TABS
1000.0000 [IU] | ORAL_TABLET | Freq: Every day | ORAL | Status: DC
  Administered 2022-12-16 – 2022-12-17 (×2): 1000 [IU] via ORAL
  Filled 2022-12-16 (×2): qty 1

## 2022-12-16 MED ORDER — POLYETHYLENE GLYCOL 3350 17 G PO PACK
17.0000 g | PACK | Freq: Every day | ORAL | Status: DC
  Administered 2022-12-16: 17 g via ORAL
  Filled 2022-12-16 (×2): qty 1

## 2022-12-16 MED ORDER — SENNOSIDES-DOCUSATE SODIUM 8.6-50 MG PO TABS
1.0000 | ORAL_TABLET | Freq: Two times a day (BID) | ORAL | Status: DC
  Administered 2022-12-16: 1 via ORAL
  Filled 2022-12-16 (×2): qty 1

## 2022-12-16 MED ORDER — POLYVINYL ALCOHOL 1.4 % OP SOLN: 1.0000 [drp] | Freq: Every day | OPHTHALMIC | Status: DC | PRN

## 2022-12-16 MED ORDER — ENSURE ENLIVE PO LIQD: 237.0000 mL | Freq: Two times a day (BID) | ORAL | Status: DC

## 2022-12-16 MED ORDER — POTASSIUM CHLORIDE CRYS ER 20 MEQ PO TBCR
40.0000 meq | EXTENDED_RELEASE_TABLET | ORAL | Status: AC
  Administered 2022-12-16 (×2): 40 meq via ORAL
  Filled 2022-12-16 (×2): qty 2

## 2022-12-16 NOTE — Progress Notes (Signed)
PROGRESS NOTE    Traci [name]  ZOX:[id number] DOB: 08-11-1941 DOA: 12/14/2022 PCP: Burnard Bunting, MD     Brief Narrative:   Traci [name] is a 82 y.o. female with medical history significant for severe AS s/p TAVR, HTN, HLD who is admitted with left-sided pyelonephritis and hydronephrosis failing outpatient antibiotics.   Subjective:   reports poor appetite , no BM for a week  denies pain,  WBC normalized, urine culture in process  Assessment & Plan:  Principal Problem:   Pyelonephritis of left kidney Active Problems:   Essential hypertension   Hyperlipidemia   S/P TAVR (transcatheter aortic valve replacement)   Hydroureteronephrosis   Adrenal nodule (HCC)    Assessment and Plan:   Left-sided pyelonephritis with hydroureteronephrosis: -Persistent symptoms despite 1 week outpatient antibiotics.  CT without obvious obstruction. - Urology input appreciated, conservative management with IV antibiotic, no urologic intervention needed -Leukocytosis resolved -Continue IV ceftriaxone, follow-up on urine culture  Uterine prolapse  a pessary placed a few weeks ago which did improve comfort but she was having issues with urinary incontinence therefore it was subsequently removed.    Blood loss anemia from GU loss Anemia of chronic disease Monitor hgb  Hypokalemia  Remain low , continue to replace, recheck in the morning  Hypertension: Stable ,continue lisinopril.   Severe aortic stenosis s/p TAVR 09/04/2022: Continue aspirin.   Hyperlipidemia: Continue atorvastatin.  Constipation Start stool softener     Left adrenal nodule: 13 x 9 mm left adrenal nodule noted on CT imaging.  Per radiology read this is a probable adrenal adenoma and 1 year follow-up adrenal washout CT is recommended.    : Body mass index is 29.9 kg/m.. :     I have Reviewed nursing notes, Vitals, pain scores, I/o's, Lab results and  imaging results since pt's last encounter,  details please see discussion above  I ordered the following labs:  Unresulted Labs (From admission, onward)     Start     Ordered   12/17/22 8119  Basic metabolic panel  Tomorrow morning,   R        12/16/22 0815   12/17/22 0500  Magnesium  Tomorrow morning,   R        12/16/22 0815   12/17/22 0500  Phosphorus  Tomorrow morning,   R        12/16/22 0815             DVT prophylaxis: enoxaparin (LOVENOX) injection 40 mg Start: 12/15/22 2200   Code Status:   Code Status: DNR  Family Communication: Patient Disposition:   Dispo: The patient is from: Home, lives herself, independent ADLs              Anticipated d/c is to: Home likely on Monday once urine culture resulted                Antimicrobials:   Anti-infectives (From admission, onward)    Start     Dose/Rate Route Frequency Ordered Stop   12/15/22 1400  cefTRIAXone (ROCEPHIN) 1 g in sodium chloride 0.9 % 100 mL IVPB        1 g 200 mL/hr over 30 Minutes Intravenous Every 24 hours 12/14/22 2203     12/14/22 1430  cefTRIAXone (ROCEPHIN) 1 g in sodium chloride 0.9 % 100 mL IVPB        1 g 200 mL/hr over 30 Minutes Intravenous  Once 12/14/22 1428 12/14/22 1549  Objective: Vitals:   12/15/22 1451 12/15/22 2252 12/16/22 0604 12/16/22 1407  BP: (!) 135/50 (!) 135/48 (!) 155/57 (!) 152/57  Pulse: 79 79 80 91  Resp: (!) '25 18 20 18  '$ Temp: 98.2 F (36.8 C) 99.5 F (37.5 C) 98.3 F (36.8 C) 98.9 F (37.2 C)  TempSrc:  Oral Oral Oral  SpO2: 93% 93% 94% 92%  Weight:      Height:        Intake/Output Summary (Last 24 hours) at 12/16/2022 1815 Last data filed at 12/16/2022 1020 Gross per 24 hour  Intake 220 ml  Output 800 ml  Net -580 ml   Filed Weights   12/14/22 1321  Weight: 86.6 kg    Examination:  General exam: alert, awake, communicative,calm, NAD Respiratory system: Clear to auscultation. Respiratory effort normal. Cardiovascular system:  RRR, + cardiac murmur  Gastrointestinal  system: Abdomen is nondistended, soft and nontender.  Normal bowel sounds heard. Central nervous system: Alert and oriented. No focal neurological deficits. Extremities:  no edema Skin: No rashes, lesions or ulcers Psychiatry: Judgement and insight appear normal. Mood & affect appropriate.     Data Reviewed: I have personally reviewed  labs and visualized  imaging studies since the last encounter and formulate the plan        Scheduled Meds:  aspirin EC  81 mg Oral Daily   atorvastatin  20 mg Oral QHS   cholecalciferol  1,000 Units Oral Daily   enoxaparin (LOVENOX) injection  40 mg Subcutaneous Q24H   feeding supplement  237 mL Oral BID BM   lisinopril  20 mg Oral Daily   pantoprazole  40 mg Oral Daily   polyethylene glycol  17 g Oral Daily   potassium chloride  40 mEq Oral Q4H   senna-docusate  1 tablet Oral BID   Continuous Infusions:  cefTRIAXone (ROCEPHIN)  IV 1 g (12/16/22 1444)     LOS: 2 days   Time spent:  40mns  FFlorencia Reasons MD PhD FACP [facility]  Available via Epic secure chat 7am-7pm for nonurgent issues Please page for urgent issues To page the attending provider between 7A-7P or the covering provider during after hours 7P-7A, please log into the web site [email or web address] and access using universal [place] password for that web site. If you do not have the password, please call the hospital operator.    12/16/2022, 6:15 PM

## 2022-12-17 DIAGNOSIS — N12 Tubulo-interstitial nephritis, not specified as acute or chronic: Secondary | ICD-10-CM | POA: Diagnosis not present

## 2022-12-17 LAB — BASIC METABOLIC PANEL
Anion gap: 10 (ref 5–15)
BUN: 13 mg/dL (ref 8–23)
CO2: 25 mmol/L (ref 22–32)
Calcium: 8.4 mg/dL — ABNORMAL LOW (ref 8.9–10.3)
Chloride: 104 mmol/L (ref 98–111)
Creatinine, Ser: 0.85 mg/dL (ref 0.44–1.00)
GFR, Estimated: >60 mL/min (ref >60)
Glucose, Bld: 102 mg/dL — ABNORMAL HIGH (ref 70–99)
Potassium: 3.9 mmol/L (ref 3.5–5.1)
Sodium: 139 mmol/L (ref 135–145)

## 2022-12-17 LAB — URINE CULTURE: Culture: >=100000 — AB

## 2022-12-17 LAB — PHOSPHORUS: Phosphorus: 3.2 mg/dL (ref 2.5–4.6)

## 2022-12-17 LAB — MAGNESIUM: Magnesium: 1.9 mg/dL (ref 1.7–2.4)

## 2022-12-17 MED ORDER — SULFAMETHOXAZOLE-TRIMETHOPRIM 800-160 MG PO TABS: 1.0000 | ORAL_TABLET | Freq: Two times a day (BID) | ORAL | 0 refills | Status: AC

## 2022-12-17 MED ORDER — SULFAMETHOXAZOLE-TRIMETHOPRIM 800-160 MG PO TABS
1.0000 | ORAL_TABLET | Freq: Two times a day (BID) | ORAL | Status: DC
  Administered 2022-12-17: 1 via ORAL
  Filled 2022-12-17: qty 1

## 2022-12-17 NOTE — TOC Progression Note (Signed)
Transition of Care [facility]) - Progression Note    Patient Details  Name: Traci [name] MRN: [id number] Date of Birth: 1941-07-06  Transition of Care [facility]) CM/SW [place], RN Phone [phone]  12/17/2022, 1:05 PM  Clinical Narrative:     Transition of Care [facility]) Screening Note   Patient Details  Name: Traci [name] Date of Birth: 03-11-1941   [facility] of [facility]) CM/SW Contact:    Angelita Ingles, RN Phone Number: 12/17/2022, 1:05 PM    Transition of Care Department [facility]) has reviewed patient and no TOC needs have been identified at this time. We will continue to monitor patient advancement through interdisciplinary progression rounds. If new patient transition needs arise, please place a TOC consult.          Expected Discharge Plan and Services         Expected Discharge Date: 12/17/22                                     Social Determinants of Health (SDOH) Interventions [place]: No Food Insecurity (12/14/2022)  Housing: Low Risk  (12/14/2022)  Transportation Needs: No Transportation Needs (12/14/2022)  Utilities: Not At Risk (12/14/2022)  Depression (PHQ2-9): Low Risk  (10/16/2022)  Tobacco Use: Low Risk  (12/14/2022)    Readmission Risk Interventions    09/06/2022   11:21 AM  Readmission Risk Prevention Plan  Post Dischage Appt Complete  Medication Screening Complete  Transportation Screening Complete

## 2022-12-21 NOTE — Discharge Summary (Signed)
Physician Discharge Summary   Patient: Traci [name] MRN: [id number] DOB: Sep 24, 1941  Admit date:     12/14/2022  Discharge date: 12/17/2022  Discharge Physician: Lorelei Pont   PCP: Burnard Bunting, MD   Recommendations at discharge:   Finish antibiotics for UTI Follow up with PCP Adrenal imaging in 1 year  Discharge Diagnoses: Principal Problem:   Pyelonephritis of left kidney Active Problems:   Essential hypertension   Hyperlipidemia   S/P TAVR (transcatheter aortic valve replacement)   Hydroureteronephrosis   Adrenal nodule ([place])  Resolved Problems:   * No resolved hospital problems. *  Hospital Course: Traci [name] is a 82 y.o. female with medical history significant for severe AS s/p TAVR, HTN, HLD who is admitted with left-sided pyelonephritis and hydronephrosis failing outpatient antibiotics.  Assessment and Plan:  Left-sided pyelonephritis with hydroureteronephrosis: Persistent symptoms despite 1 week outpatient antibiotics.  CT without obvious obstruction. -Urology recommended conservative management with IV antibiotic, no urologic intervention needed. -s/p  IV ceftriaxone, transitioned to bactrim based on urine culture for complicated UTI course for 10 additional days   Uterine prolapse  a pessary placed a few weeks ago which did improve comfort but she was having issues with urinary incontinence therefore it was subsequently removed.    Blood loss anemia from GU loss Anemia of chronic disease Monitor hgb  Hypertension: Stable ,continue lisinopril.   Severe aortic stenosis s/p TAVR 09/04/2022: Continue aspirin.   Hyperlipidemia: Continue atorvastatin.   Constipation Start stool softener   Left adrenal nodule: 13 x 9 mm left adrenal nodule noted on CT imaging.  Per radiology read this is a probable adrenal adenoma and 1 year follow-up adrenal washout CT is recommended.      Consultants: urology Procedures performed: none Disposition:  Home Diet recommendation:  Discharge Diet Orders (From admission, onward)     Start     Ordered   12/17/22 0000  Diet - low sodium heart healthy        12/17/22 1243           Regular diet DISCHARGE MEDICATION: Allergies as of 12/17/2022   No Known Allergies      Medication List     TAKE these medications    amoxicillin 500 MG tablet Commonly known as: AMOXIL Take 4 tablets by mouth 1 hour before dental procedures and cleanings   aspirin EC 81 MG tablet Take 81 mg by mouth every evening. Swallow whole.   atorvastatin 20 MG tablet Commonly known as: LIPITOR Take 20 mg by mouth at bedtime.   cholecalciferol 1000 units tablet Commonly known as: VITAMIN D Take 1,000 Units by mouth daily.   lisinopril 20 MG tablet Commonly known as: ZESTRIL TAKE 1 TABLET BY MOUTH EVERY DAY   pantoprazole 40 MG tablet Commonly known as: PROTONIX Take 40 mg by mouth every evening.   Soothe XP Soln Place 1 drop into both eyes daily as needed (dry eyes).   sulfamethoxazole-trimethoprim 800-160 MG tablet Commonly known as: BACTRIM DS Take 1 tablet by mouth 2 (two) times daily for 10 days.        Discharge Exam: Filed Weights   12/14/22 1321  Weight: 86.6 kg   Physical Exam Vitals and nursing note reviewed.  Constitutional:      Appearance: Normal appearance. She is not ill-appearing.  HENT:     Head: Normocephalic and atraumatic.     Mouth/Throat:     Mouth: Mucous membranes are moist.  Cardiovascular:  Rate and Rhythm: Normal rate and regular rhythm.  Abdominal:     General: Abdomen is flat. There is no distension.     Palpations: Abdomen is soft. There is no mass.     Tenderness: There is no abdominal tenderness.  Musculoskeletal:     Right lower leg: No edema.     Left lower leg: No edema.  Skin:    General: Skin is warm and dry.     Capillary Refill: Capillary refill takes less than 2 seconds.  Neurological:     Mental Status: She is alert.   Psychiatric:        Mood and Affect: Mood normal.        Behavior: Behavior normal.      Condition at discharge: good  The results of significant diagnostics from this hospitalization (including imaging, microbiology, ancillary and laboratory) are listed below for reference.   Imaging Studies: CT ABDOMEN PELVIS W CONTRAST  Addendum Date: 12/17/2022   ADDENDUM REPORT: 12/17/2022 08:51 ADDENDUM: Additional images are reviewed. Correction: Uterus is not surgically absent but is prolapsed, externally located at the perineum. Several calcified uterine leiomyomata are visualized. Inferior extent of the cystocele is located anterior to the prolapsed uterus, also demonstrating a significantly thickened wall which could be related to incomplete distension or other process such as chronic cystitis; correlation with cystoscopy recommended to exclude other bladder pathology including neoplasm. Findings called to current ED physician at [facility] Dr. Stark Jock on 12/17/2022 at 0850 hours. Electronically Signed   By: Lavonia Dana M.D.   On: 12/17/2022 08:51   Result Date: 12/17/2022 CLINICAL DATA:  LEFT lower quadrant pain, UTI recurrent/complicated in a female, blood in urine, history of uterine prolapse, constipation EXAM: CT ABDOMEN AND PELVIS WITH CONTRAST TECHNIQUE: Multidetector CT imaging of the abdomen and pelvis was performed using the standard protocol following bolus administration of intravenous contrast. RADIATION DOSE REDUCTION: This exam was performed according to the departmental dose-optimization program which includes automated exposure control, adjustment of the mA and/or kV according to patient size and/or use of iterative reconstruction technique. CONTRAST:  143m OMNIPAQUE IOHEXOL 300 MG/ML SOLN IV. No oral contrast. COMPARISON:  11/15/2022 FINDINGS: Lower chest: Scarring RIGHT middle lobe.  Post TAVR. Hepatobiliary: Multiple hepatic cysts largest lateral segment LEFT lobe 6.4 x 4.3 cm image 19. Post  cholecystectomy. No biliary dilatation. Pancreas: Atrophic appearance Spleen: Normal appearance Adrenals/Urinary Tract: 13 x 9 mm LEFT adrenal nodule, measuring 34 HU. Adrenal glands otherwise normal appearance. 12 mm inferior pole LEFT renal cyst; no follow-up imaging recommended. Excreted contrast material and renal collecting systems bilaterally. No RIGHT renal mass. LEFT hydronephrosis and hydroureter are identified with wall thickening and enhancement of the renal pelvis and LEFT ureter throughout its length. Bladder is decompressed. Bladder wall appears thickened likely accentuated by decompression, and demonstrates significant descent in the pelvis consistent with cystocele. No obstructing ureteral calculus. Stomach/Bowel: Small hiatal hernia. Normal appendix. Scattered stool in colon. Diverticulosis of distal transverse through sigmoid colon without evidence of diverticulitis. Bowel loops otherwise unremarkable. Vascular/Lymphatic: Atherosclerotic calcifications aorta and iliac arteries without aneurysm. Reproductive: Uterus surgically absent. Nonvisualization of ovaries. Other: No free air or free fluid. Small periumbilical hernia containing fat. Musculoskeletal: Osseous demineralization. IMPRESSION: LEFT hydronephrosis and hydroureter with wall thickening and enhancement of the renal pelvis and LEFT ureter throughout its length, favoring urinary tract infection; recommend correlation with urinalysis. Bladder wall appears thickened likely accentuated by decompression, and demonstrates significant descent in the pelvis consistent with large cystocele. Distal colonic diverticulosis  without evidence of diverticulitis. Small hiatal hernia. Small periumbilical hernia containing fat. 13 x 9 mm LEFT adrenal nodule, measuring 34 HU; this is a probable adrenal adenoma and 1 year follow-up adrenal washout CT is recommended, presuming indicated based on patient age and comorbidities. Aortic Atherosclerosis  (ICD10-I70.0). Electronically Signed: By: Lavonia Dana M.D. On: 12/14/2022 15:43   DG Chest 2 View  Result Date: 12/12/2022 CLINICAL DATA:  Chest pain for 2 days EXAM: CHEST - 2 VIEW COMPARISON:  08/31/2022 FINDINGS: Interval TAVR. The heart size and mediastinal contours are within normal limits. Both lungs are clear. The visualized skeletal structures are unremarkable. IMPRESSION: No active cardiopulmonary disease. Electronically Signed   By: Merilyn Baba M.D.   On: 12/12/2022 14:08    Microbiology: Results for orders placed or performed during the hospital encounter of 12/14/22  Urine Culture (for pregnant, neutropenic or urologic patients or patients with an indwelling urinary catheter)     Status: Abnormal   Collection Time: 12/14/22  2:29 PM   Specimen: Urine, Clean Catch  Result Value Ref Range Status   Specimen Description   Final    URINE, CLEAN CATCH Performed at [facility], [address] [place], [place] [address]    Special Requests   Final    NONE Performed at [facility], [address] [place], [place] [address]    Culture (A)  Final    >=100,000 COLONIES/mL ESCHERICHIA COLI Confirmed Extended Spectrum Beta-Lactamase Producer (ESBL).  In bloodstream infections from ESBL organisms, carbapenems are preferred over piperacillin/tazobactam. They are shown to have a lower risk of mortality.    Report Status 12/17/2022 FINAL  Final   Organism ID, Bacteria ESCHERICHIA COLI (A)  Final      Susceptibility   Escherichia coli - MIC*    AMPICILLIN >=32 RESISTANT Resistant     CEFAZOLIN >=64 RESISTANT Resistant     CEFEPIME 16 RESISTANT Resistant     CEFTRIAXONE >=64 RESISTANT Resistant     CIPROFLOXACIN 0.5 INTERMEDIATE Intermediate     GENTAMICIN >=16 RESISTANT Resistant     IMIPENEM <=0.25 SENSITIVE Sensitive     NITROFURANTOIN <=16 SENSITIVE Sensitive     TRIMETH/SULFA <=20 SENSITIVE Sensitive     AMPICILLIN/SULBACTAM >=32 RESISTANT Resistant      PIP/TAZO <=4 SENSITIVE Sensitive     * >=100,000 COLONIES/mL ESCHERICHIA COLI    Labs: CBC: Recent Labs  Lab 12/14/22 1439 12/15/22 0601 12/16/22 0709  WBC 15.0* 12.0* 9.7  NEUTROABS 12.9*  --  7.2  HGB 11.2* 9.7* 10.1*  HCT 36.2 31.4* 33.8*  MCV 88.1 88.7 90.9  PLT 332 261 287   Basic Metabolic Panel: Recent Labs  Lab 12/14/22 1439 12/15/22 0601 12/16/22 0709 12/17/22 0532  NA 137 139 141 139  K 4.0 3.3* 3.1* 3.9  CL 104 106 106 104  CO2 '26 25 26 25  '$ GLUCOSE 117* 115* 113* 102*  BUN '16 16 13 13  '$ CREATININE 0.96 0.94 0.98 0.85  CALCIUM 8.4* 8.1* 8.3* 8.4*  MG  --   --  1.9 1.9  PHOS  --   --   --  3.2   Liver Function Tests: Recent Labs  Lab 12/14/22 1439  AST 21  ALT 18  ALKPHOS 61  BILITOT 0.6  PROT 6.8  ALBUMIN 3.0*   CBG: No results for input(s): "GLUCAP" in the last 168 hours.  Discharge time spent: less than 30 minutes.  Signed: Lorelei Pont, MD [facility]  12/21/2022 

## 2022-12-24 ENCOUNTER — Ambulatory Visit: Payer: Medicare HMO | Admitting: Physician Assistant

## 2022-12-24 ENCOUNTER — Ambulatory Visit (HOSPITAL_COMMUNITY)
Admission: RE | Admit: 2022-12-24 | Discharge: 2022-12-24 | Disposition: A | Discharge status: Home / Self Care | Payer: Medicare HMO | Source: Ambulatory Visit | Attending: Surgery | Admitting: Surgery

## 2022-12-24 VITALS — BP 127/69 | HR 75 | Temp 97.7°F | Resp 20 | Ht 67.0 in | Wt 191.9 lb

## 2022-12-24 DIAGNOSIS — I7777 Dissection of artery of lower extremity: Secondary | ICD-10-CM

## 2022-12-24 LAB — VAS US ABI WITH/WO TBI
Left ABI: 1.2
Right ABI: 1.19

## 2022-12-24 NOTE — Progress Notes (Unsigned)
Office Note   History of Present Illness   Traci [name] is a 82 y.o. (07-Jan-1941) female who presents for follow-up.  She has a history of right common femoral artery dissection and acute occlusion s/p transcatheter aortic valve replacement on 09/04/2022.  She subsequently required right CFA endarterectomy with bovine patch angioplasty by Dr. Scot Dock.  At her first follow-up, she was doing well.  She denied any claudication, rest pain, nonhealing wounds of the lower extremities.  She does have some swelling in the right leg that seem to get better overnight.  She is doing well today at follow-up.  She was recently hospitalized for pyelonephritis and is currently on antibiotics.  She denies any claudication, rest pain, nonhealing wounds of the lower extremities.  She is still taking her aspirin without issue.  Her leg swelling has gotten much better.  Current Outpatient Medications  Medication Sig Dispense Refill   amoxicillin (AMOXIL) 500 MG tablet Take 4 tablets by mouth 1 hour before dental procedures and cleanings 12 tablet 6   Artificial Tear Solution (SOOTHE XP) SOLN Place 1 drop into both eyes daily as needed (dry eyes).     aspirin EC 81 MG tablet Take 81 mg by mouth every evening. Swallow whole.     atorvastatin (LIPITOR) 20 MG tablet Take 20 mg by mouth at bedtime.     cholecalciferol (VITAMIN D) 1000 UNITS tablet Take 1,000 Units by mouth daily.     lisinopril (ZESTRIL) 20 MG tablet TAKE 1 TABLET BY MOUTH EVERY DAY 90 tablet 3   pantoprazole (PROTONIX) 40 MG tablet Take 40 mg by mouth every evening.     sulfamethoxazole-trimethoprim (BACTRIM DS) 800-160 MG tablet Take 1 tablet by mouth 2 (two) times daily for 10 days. 20 tablet 0   No current facility-administered medications for this visit.    REVIEW OF SYSTEMS (negative unless checked):   Cardiac:  []$  Chest pain or chest pressure? []$  Shortness of breath upon activity? []$  Shortness of breath when lying flat? []$   Irregular heart rhythm?  Vascular:  []$  Pain in calf, thigh, or hip brought on by walking? []$  Pain in feet at night that wakes you up from your sleep? []$  Blood clot in your veins? []$  Leg swelling?  Pulmonary:  []$  Oxygen at home? []$  Productive cough? []$  Wheezing?  Neurologic:  []$  Sudden weakness in arms or legs? []$  Sudden numbness in arms or legs? []$  Sudden onset of difficult speaking or slurred speech? []$  Temporary loss of vision in one eye? []$  Problems with dizziness?  Gastrointestinal:  []$  Blood in stool? []$  Vomited blood?  Genitourinary:  []$  Burning when urinating? []$  Blood in urine?  Psychiatric:  []$  Major depression  Hematologic:  []$  Bleeding problems? []$  Problems with blood clotting?  Dermatologic:  []$  Rashes or ulcers?  Constitutional:  []$  Fever or chills?  Ear/Nose/Throat:  []$  Change in hearing? []$  Nose bleeds? []$  Sore throat?  Musculoskeletal:  []$  Back pain? []$  Joint pain? []$  Muscle pain?   Physical Examination   Vitals:   12/24/22 1452  BP: 127/69  Pulse: 75  Resp: 20  Temp: 97.7 F (36.5 C)  TempSrc: Temporal  SpO2: 96%  Weight: 191 lb 14.4 oz (87 kg)  Height: 5' 7"$  (1.702 m)   Body mass index is 30.06 kg/m.  General:  WDWN in NAD; vital signs documented above Gait: Not observed HENT: WNL, normocephalic Pulmonary: normal non-labored breathing  Cardiac: Regular rate and rhythm Abdomen: soft, NT, no masses  Skin: without rashes Vascular Exam/Pulses: 1+ DP pulses bilaterally Extremities: without ischemic changes, without Gangrene , without cellulitis; without open wounds;  Musculoskeletal: no muscle wasting or atrophy  Neurologic: A&O X 3;  No focal weakness or paresthesias are detected Psychiatric:  The pt has Normal affect.  Non-Invasive Vascular imaging   ABI (12/24/2022)  +---------+------------------+-----+---------+--------+  Right   Rt Pressure (mmHg)IndexWaveform Comment    +---------+------------------+-----+---------+--------+  PTA     147               1.19 biphasic           +---------+------------------+-----+---------+--------+  DP      134               1.08 triphasic          +---------+------------------+-----+---------+--------+  Great Toe64                0.52                    +---------+------------------+-----+---------+--------+   +---------+------------------+-----+---------+-------+  Left    Lt Pressure (mmHg)IndexWaveform Comment  +---------+------------------+-----+---------+-------+  Brachial 124                                      +---------+------------------+-----+---------+-------+  PTA     138               1.11 triphasic         +---------+------------------+-----+---------+-------+  DP      149               1.20 triphasic         +---------+------------------+-----+---------+-------+  Great Toe52                0.42                   +---------+------------------+-----+---------+-------+   +-------+-----------+-----------+------------+------------+  ABI/TBIToday's ABIToday's TBIPrevious ABIPrevious TBI  +-------+-----------+-----------+------------+------------+  Right 1.19       0.52                                 +-------+-----------+-----------+------------+------------+  Left  1.20       0.42                                 +-------+-----------+-----------+------------+------------+   Medical Decision Making   Traci [name] is a 82 y.o. female who presents for surveillance of PAD s/p right CFA endarterectomy  Based on the patient's vascular studies, her right ABI is 1.19 and left ABI is 1.20.  She has triphasic flow in bilateral PT/DP arteries She has palpable DP pulses 1+ bilaterally.  She has no symptoms of claudication, rest pain, tissue loss She can continue her aspirin and statin She can follow-up with our office in 1 year with repeat  ABIs   Vicente Serene PA-C [facility] Office: [facility] MD: Trula Slade

## 2023-01-01 ENCOUNTER — Telehealth (HOSPITAL_COMMUNITY): Payer: Self-pay | Admitting: *Deleted

## 2023-01-01 NOTE — Telephone Encounter (Signed)
Spoke with Flocco she has in the hospital recently and is inquiring about returning to complete the program. Will Hold off until she has follow up with Dr Sallyanne Kuster on 01/07/23 will need clearance to resume exercise.Harrell Gave RN BSN

## 2023-01-07 ENCOUNTER — Encounter: Payer: Self-pay | Admitting: Cardiovascular Disease

## 2023-01-07 ENCOUNTER — Ambulatory Visit: Payer: Medicare HMO | Attending: Cardiovascular Disease | Admitting: Cardiovascular Disease

## 2023-01-07 VITALS — BP 116/60 | HR 79 | Ht 67.0 in | Wt 193.4 lb

## 2023-01-07 DIAGNOSIS — I10 Essential (primary) hypertension: Secondary | ICD-10-CM | POA: Diagnosis not present

## 2023-01-07 DIAGNOSIS — I480 Paroxysmal atrial fibrillation: Secondary | ICD-10-CM

## 2023-01-07 DIAGNOSIS — E7800 Pure hypercholesterolemia, unspecified: Secondary | ICD-10-CM

## 2023-01-07 DIAGNOSIS — Z952 Presence of prosthetic heart valve: Secondary | ICD-10-CM

## 2023-01-07 DIAGNOSIS — E669 Obesity, unspecified: Secondary | ICD-10-CM | POA: Diagnosis not present

## 2023-01-07 DIAGNOSIS — R55 Syncope and collapse: Secondary | ICD-10-CM

## 2023-01-07 NOTE — Progress Notes (Addendum)
Cardiology Office Note    Date:  01/07/2023   ID:  Traci [name], DOB 1941/01/17, MRN [id number]  PCP:  Burnard Bunting, MD  Cardiologist:   Sanda Klein, MD   Chief Complaint  Patient presents with   Cardiac Valve Problem    History of Present Illness:  Traci [name] is a 82 y.o. female with a history of severe aortic stenosis status post TAVR Oletta Lamas SAPIEN 326 mm [id number], complicated by dissection right femoral artery requiring surgical endarterectomy and groin hematoma), hyperlipidemia and hypertension.  She had a single episode of paroxysmal atrial fibrillation in 2012 immediately after a laparoscopic cholecystectomy when she was also hypokalemic (first and only evident clinical recurrence occurred during the hospitalization earlier this month) and a single episode of vasovagal syncope (about 3 years ago).  Additional problems include longstanding uterine prolapse and recurrent urinary tract infections including a recent hospitalization for pyelonephritis in early February 2024.  Admission ECG showed sinus rhythm, but during her hospitalization she developed atrial fibrillation with RVR.  While wearing a pessary she felt more comfortable, but also developed complete urinary incontinence.  She is considering undergoing surgical repair.  Until her episode of pyelonephritis that led to hospitalization this month she was participating in "intensive cardiac rehab" and was doing well.  She denied problems with exertional dyspnea, exertional angina or dizziness.  Her right groin site has healed well and she has no residual complaints relating to lower extremity arterial circulation.  Recent ABI was normal bilaterally, with reduced TBI bilaterally.    Past Medical History:  Diagnosis Date   GERD (gastroesophageal reflux disease)    High cholesterol    Hypertension    Irregular heart rate    S/P TAVR (transcatheter aortic valve replacement) 09/04/2022   s/p TAVR with a 59m  Edwards S3UR via the TF approach by Dr. [name]& Dr. [name]   Past Surgical History:  Procedure Laterality Date   APPLICATION OF WOUND VAC Right 09/04/2022   Procedure: APPLICATION OF WOUND VAC;  Surgeon: [name] MD;  [facility] [facility]  Service: Vascular;  Laterality: Right;   CARDIAC CATHETERIZATION     CHOLECYSTECTOMY     DILATION AND CURETTAGE OF UTERUS     ENDARTERECTOMY FEMORAL Right 09/04/2022   Procedure: ENDARTERECTOMY FEMORAL;  Surgeon: [name] MD;  [place] [facility]  Service: Vascular;  Laterality: Right;   GROIN DISSECTION Right 09/04/2022   Procedure: GVirl SonEXPLORATION;  Surgeon: [name] MD;  [place] [facility]  Service: Vascular;  Laterality: Right;   INTRAOPERATIVE TRANSTHORACIC ECHOCARDIOGRAM N/A 09/04/2022   Procedure: INTRAOPERATIVE TRANSTHORACIC ECHOCARDIOGRAM;  Surgeon: [name] MD;  [place] [facility]  Service: Open Heart Surgery;  Laterality: N/A;   PATCH ANGIOPLASTY Right 09/04/2022   Procedure: BOVINE PATCH ANGIOPLASTY;  Surgeon: [name] MD;  [facility]  Service: Vascular;  Laterality: Right;   RIGHT/LEFT HEART CATH AND CORONARY ANGIOGRAPHY N/A 06/20/2022   Procedure: RIGHT/LEFT HEART CATH AND CORONARY ANGIOGRAPHY;  Surgeon: MBurnell Blanks MD;  [facility];  Service: Cardiovascular;  Laterality: N/A;   TRANSCATHETER AORTIC VALVE REPLACEMENT, TRANSFEMORAL N/A 09/04/2022   Procedure: Transcatheter Aortic Valve Replacement, Transfemoral;  Surgeon: [name] MD;  [place] [facility]  Service: Open Heart Surgery;  Laterality: N/A;   TUBAL LIGATION     ULTRASOUND GUIDANCE FOR VASCULAR ACCESS Bilateral 09/04/2022   Procedure: ULTRASOUND GUIDANCE FOR VASCULAR ACCESS;  Surgeon: [name] MD;  [place] [facility]  Service: Open Heart Surgery;  Laterality: Bilateral;    Current Medications: Outpatient Medications Prior to Visit  Medication Sig Dispense Refill    Artificial Tear Solution (SOOTHE XP) SOLN Place 1 drop into both eyes daily as needed (dry eyes).     aspirin EC 81 MG tablet Take 81 mg by mouth every evening. Swallow whole.     atorvastatin (LIPITOR) 20 MG tablet Take 20 mg by mouth at bedtime.     cholecalciferol (VITAMIN D) 1000 UNITS tablet Take 1,000 Units by mouth daily.     lisinopril (ZESTRIL) 20 MG tablet TAKE 1 TABLET BY MOUTH EVERY DAY 90 tablet 3   pantoprazole (PROTONIX) 40 MG tablet Take 40 mg by mouth every evening.     amoxicillin (AMOXIL) 500 MG tablet Take 4 tablets by mouth 1 hour before dental procedures and cleanings (Patient not taking: Reported on 01/07/2023) 12 tablet 6   No facility-administered medications prior to visit.     Allergies:   Patient has no known allergies.   Social History   Socioeconomic History   Marital status: Widowed    Spouse name: Not on file   Number of children: 3   Years of education: 12   Highest education level: High school graduate  Occupational History   Occupation: Retired  Tobacco Use   Smoking status: Never    Passive exposure: Never   Smokeless tobacco: Never  Vaping Use   Vaping Use: Never used  Substance and Sexual Activity   Alcohol use: Yes    Comment: OCCASIONALLY wine per pt   Drug use: No   Sexual activity: Not Currently  Other Topics Concern   Not on file  Social History Narrative   Not on file   Social Determinants of Health   Financial Resource Strain: Not on file  Food Insecurity: No Food Insecurity (12/14/2022)   Hunger Vital Sign    Worried About Running Out of Food in the Last Year: Never true    Lily in the Last Year: Never true  Transportation Needs: No Transportation Needs (12/14/2022)   PRAPARE - Hydrologist (Medical): No    Lack of Transportation (Non-Medical): No  Physical Activity: Not on file  Stress: Not on file  Social Connections: Not on file     Family History:  The patient's family history  includes Heart disease in her father.   ROS:   Please see the history of present illness.    ROS  All other systems are reviewed and are negative.   PHYSICAL EXAM:   VS:  BP 116/60 (BP Location: Left Arm, Patient Position: Sitting, Cuff Size: Large)   Pulse 79   Ht '5\' 7"'$  (1.702 m)   Wt 193 lb 6.4 oz (87.7 kg)   SpO2 95%   BMI 30.29 kg/m      General: Alert, oriented x3, no distress, borderline obese Head: no evidence of trauma, PERRL, EOMI, no exophtalmos or lid lag, no myxedema, no xanthelasma; normal ears, nose and oropharynx Neck: normal jugular venous pulsations and no hepatojugular reflux; brisk carotid pulses without delay and no carotid bruits Chest: clear to auscultation, no signs of consolidation by percussion or palpation, normal fremitus, symmetrical and full respiratory excursions Cardiovascular: normal position and quality of the apical impulse, regular rhythm, normal first and second heart sounds, 1/6 early peaking aortic ejection murmur, no diastolic murmurs, rubs or gallops Abdomen: no tenderness or distention, no masses by palpation, no abnormal  pulsatility or arterial bruits, normal bowel sounds, no hepatosplenomegaly Extremities: no clubbing, cyanosis or edema; 2+ radial, ulnar and brachial pulses bilaterally; 2+ right femoral, posterior tibial and dorsalis pedis pulses; 2+ left femoral, posterior tibial and dorsalis pedis pulses; no subclavian or femoral bruits Neurological: grossly nonfocal Psych: Normal mood and affect    Wt Readings from Last 3 Encounters:  01/07/23 193 lb 6.4 oz (87.7 kg)  12/24/22 191 lb 14.4 oz (87 kg)  12/14/22 190 lb 14.7 oz (86.6 kg)      Studies/Labs Reviewed:   Echocardiogram 10/10/22:   1. Cystic liver dx noted on sub costal images. Left ventricular ejection fraction, by estimation, is 60 to 65%. The left ventricle has normal function. The left ventricle has no regional wall motion abnormalities. There is mild left  ventricular hypertrophy. Left ventricular diastolic parameters were normal. The average left ventricular global longitudinal strain is -22.1 %. The global longitudinal strain is normal. 2. Right ventricular systolic function is normal. The right ventricular size is normal. There is normal pulmonary artery systolic pressure. 3. The mitral valve is abnormal. Trivial mitral valve regurgitation. No evidence of mitral stenosis. 4. Post AVR with 26 mm Sapien 3 valve since post implant TTE 10/25 new mild PVL noted at 1:00 on short axis images gradients stable . The aortic valve has been repaired/replaced. Aortic valve regurgitation is not visualized. No aortic stenosis is present. 5. The inferior vena cava is normal in size with greater than 50% respiratory variability, suggesting right atrial pressure of 3 mmHg.   Cardiac catheterization 06/20/2022 :    Prox RCA to Mid RCA lesion is 10% stenosed.   Prox Cx to Mid Cx lesion is 10% stenosed.   Ost LAD to Mid LAD lesion is 10% stenosed.   Mild non-obstructive CAD Moderately severe to severe aortic stenosis. Peak to peak gradient 37.3 mmHg, mean gradient 36.7 mmHg.  Normal right and left heart pressures.  Normal cardiac output   Recommendations: Will review with Dr. Sallyanne Kuster. Given the fact that she is symptomatic and has AS that is bordering on severe, will decide between Progress trial and proceeding with TAVR.   Fick Cardiac Output 7.97 L/min  Fick Cardiac Output Index 3.87 (L/min)/BSA  Aortic Mean Gradient 36.65 mmHg  Aortic Peak Gradient 37.3 mmHg  Aortic Valve Area 1.21  Aortic Value Area Index 0.59 cm2/BSA  RA A Wave 8 mmHg  RA V Wave 5 mmHg  RA Mean 2 mmHg  RV Systolic Pressure 37 mmHg  RV Diastolic Pressure 1 mmHg  RV EDP 8 mmHg  PA Systolic Pressure 37 mmHg  PA Diastolic Pressure 10 mmHg  PA Mean 19 mmHg  PW A Wave 10 mmHg  PW V Wave 11 mmHg  PW Mean 10 mmHg  AO Systolic Pressure Q000111Q mmHg  AO Diastolic Pressure 60  mmHg  AO Mean 92 mmHg  LV Systolic Pressure Q000111Q mmHg  LV Diastolic Pressure 3 mmHg  LV EDP 17 mmHg  AOp Systolic Pressure Q000111Q mmHg  AOp Diastolic Pressure 61 mmHg  AOp Mean Pressure 98 mmHg  LVp Systolic Pressure Q000111Q mmHg  LVp Diastolic Pressure 14 mmHg  LVp EDP Pressure 15 mmHg  QP/QS 1  TPVR Index 4.91 HRUI  TSVR Index 23.77 HRUI  PVR SVR Ratio 0.1  TPVR/TSVR Ratio 0.21    EKG:  EKG is not ordered today.  The tracing from 12/12/2022 showed atrial fibrillation with RVR and relatively minor nonspecific ST segment changes, rate related. Recent Labs: 04/15/2019 Total cholesterol  161, HDL 60, LDL 90, triglycerides 57 Hemoglobin 12.5, creatinine 1.0, normal TSH  04/26/2021 Cholesterol 173, HDL 75, LDL 81, triglycerides 87  40/98/1191 Cholesterol 172, HDL 65, LDL 88, triglycerides 94 Potassium 4.5, ALT 12   ASSESSMENT:    1. S/P TAVR (transcatheter aortic valve replacement)   2. Vasovagal syncope   3. Paroxysmal atrial fibrillation (HCC)   4. Essential hypertension   5. Hypercholesterolemia   6. Mild obesity       PLAN:  In order of problems listed above:  AS s/p TAVR: Asymptomatic.  Although she had some access complications at the time of the procedure these have also resolved.  I think she should go back and complete her course of cardiac rehab.  Reviewed the importance of endocarditis prophylaxis with dental procedures.  If she should decide to have uterine prolapse surgery, I think this can be performed with low cardiovascular risk (corrected note 03/01/2023). Syncope:  she had a single episode of syncope that sounded vasovagal in its pattern and has not recurred in about 3 years. AFib: Although it was not clearly documented in the progress notes during her hospitalization, her ECG from 12/12/2022 clearly shows atrial fibrillation with rapid ventricular response.I am not sure how long it lasted.  Subsequent rhythm strips during the same hospital stay all showed normal  sinus rhythm.  Prior to that,  she had a single one-time event (10 years ago in the perioperative period, likely precipitated by hypokalemia) .  She has not had any atrial fibrillation during monitoring cardiac rehab over the last couple of months.  CHA2DS2-VASc score is 53 (age 27, gender, hypertension), but there is no history of stroke or TIA.  Will hold off anticoagulation for now, but plan outpatient 30-day event monitor. HTN: Well-controlled. HLP: Continue statin.  She had minimal CAD at the time of cardiac catheterization.  LDL target at less than 100 appears acceptable. Obesity: Weight loss would be beneficial.    Medication Adjustments/Labs and Tests Ordered: Current medicines are reviewed at length with the patient today.  Concerns regarding medicines are outlined above.  Medication changes, Labs and Tests ordered today are listed in the Patient Instructions below. Patient Instructions  Medication Instructions:  No changes *If you need a refill on your cardiac medications before your next appointment, please call your pharmacy*  Follow-Up: At [facility], you and your health needs are our priority.  As part of our continuing mission to provide you with exceptional heart care, we have created designated Provider Care Teams.  These Care Teams include your primary Cardiologist (physician) and Advanced Practice Providers (APPs -  Physician Assistants and Nurse Practitioners) who all work together to provide you with the care you need, when you need it.  We recommend signing up for the patient portal called "MyChart".  Sign up information is provided on this After Visit Summary.  MyChart is used to connect with patients for Virtual Visits (Telemedicine).  Patients are able to view lab/test results, encounter notes, upcoming appointments, etc.  Non-urgent messages can be sent to your provider as well.   To learn more about what you can do with MyChart, go to [email or web address].     Your next appointment:   1 year(s)  Provider:   Thurmon Fair, MD       Signed, Thurmon Fair, MD  01/07/2023 7:37 PM    [facility] [address] [place], [place], [place]  [address] Phone: [phone]; Fax: [phone]

## 2023-01-07 NOTE — Patient Instructions (Signed)
Medication Instructions:  No changes *If you need a refill on your cardiac medications before your next appointment, please call your pharmacy*  Follow-Up: At [facility], you and your health needs are our priority.  As part of our continuing mission to provide you with exceptional heart care, we have created designated Provider Care Teams.  These Care Teams include your primary Cardiologist (physician) and Advanced Practice Providers (APPs -  Physician Assistants and Nurse Practitioners) who all work together to provide you with the care you need, when you need it.  We recommend signing up for the patient portal called "MyChart".  Sign up information is provided on this After Visit Summary.  MyChart is used to connect with patients for Virtual Visits (Telemedicine).  Patients are able to view lab/test results, encounter notes, upcoming appointments, etc.  Non-urgent messages can be sent to your provider as well.   To learn more about what you can do with MyChart, go to [email or web address].    Your next appointment:   1 year(s)  Provider:   Sanda Klein, MD

## 2023-01-08 ENCOUNTER — Encounter: Payer: Self-pay | Admitting: Emergency Medicine

## 2023-01-08 ENCOUNTER — Telehealth (HOSPITAL_COMMUNITY): Payer: Self-pay | Admitting: *Deleted

## 2023-01-08 ENCOUNTER — Encounter (HOSPITAL_COMMUNITY): Payer: Self-pay | Admitting: *Deleted

## 2023-01-08 DIAGNOSIS — Z952 Presence of prosthetic heart valve: Secondary | ICD-10-CM

## 2023-01-08 NOTE — Progress Notes (Signed)
Cardiac Individual Treatment Plan  Patient Details  Name: Traci [name] MRN: [id number] Date of Birth: [birth year] Referring Provider:   Flowsheet Row INTENSIVE CARDIAC REHAB ORIENT from 10/16/2022 in [facility], [facility], & [place]  Referring Provider Croitoru, Dani Gobble, MD       Initial Encounter Date:  [place] from 10/16/2022 in [facility]  Date 10/16/22       Visit Diagnosis: 09/04/22 TAVR (transcatheter aortic valve replacement)  Patient's Home Medications on Admission:  Current Outpatient Medications:    amoxicillin (AMOXIL) 500 MG tablet, Take 4 tablets by mouth 1 hour before dental procedures and cleanings (Patient not taking: Reported on 01/07/2023), Disp: 12 tablet, Rfl: 6   Artificial Tear Solution (SOOTHE XP) SOLN, Place 1 drop into both eyes daily as needed (dry eyes)., Disp: , Rfl:    aspirin EC 81 MG tablet, Take 81 mg by mouth every evening. Swallow whole., Disp: , Rfl:    atorvastatin (LIPITOR) 20 MG tablet, Take 20 mg by mouth at bedtime., Disp: , Rfl:    cholecalciferol (VITAMIN D) 1000 UNITS tablet, Take 1,000 Units by mouth daily., Disp: , Rfl:    lisinopril (ZESTRIL) 20 MG tablet, TAKE 1 TABLET BY MOUTH EVERY DAY, Disp: 90 tablet, Rfl: 3   pantoprazole (PROTONIX) 40 MG tablet, Take 40 mg by mouth every evening., Disp: , Rfl:   Past Medical History: Past Medical History:  Diagnosis Date   GERD (gastroesophageal reflux disease)    High cholesterol    Hypertension    Irregular heart rate    S/P TAVR (transcatheter aortic valve replacement) 09/04/2022   s/p TAVR with a 64m Edwards S3UR via the TF approach by Dr. [name]& Dr. [name]   Tobacco Use: Social History   Tobacco Use  Smoking Status Never   Passive exposure: Never  Smokeless Tobacco Never    Labs: Review Flowsheet       Latest Ref Rng & Units 02/10/2011  02/11/2011 06/20/2022 09/04/2022  Labs for ITP Cardiac and Pulmonary Rehab  Cholestrol 0 - 200 mg/dL 151        ATP III CLASSIFICATION:  <200     mg/dL   Desirable  200-239  mg/dL   Borderline High  >=240    mg/dL   High         135        ATP III CLASSIFICATION:  <200     mg/dL   Desirable  200-239  mg/dL   Borderline High  >=240    mg/dL   High         - -  LDL (calc) 0 - 99 mg/dL 71        Total Cholesterol/HDL:CHD Risk Coronary Heart Disease Risk Table                     Men   Women  1/2 Average Risk   3.4   3.3  Average Risk       5.0   4.4  2 X Average Risk   9.6   7.1  3 X Average Risk  23.4   11.0        Use the calculated Patient Ratio above and the CHD Risk Table to determine the patient's CHD Risk.        ATP III CLASSIFICATION (LDL):  <100     mg/dL  Optimal  100-129  mg/dL   Near or Above                    Optimal  130-159  mg/dL   Borderline  160-189  mg/dL   High  >190     mg/dL   Very High  62        Total Cholesterol/HDL:CHD Risk Coronary Heart Disease Risk Table                     Men   Women  1/2 Average Risk   3.4   3.3  Average Risk       5.0   4.4  2 X Average Risk   9.6   7.1  3 X Average Risk  23.4   11.0        Use the calculated Patient Ratio above and the CHD Risk Table to determine the patient's CHD Risk.        ATP III CLASSIFICATION (LDL):  <100     mg/dL   Optimal  100-129  mg/dL   Near or Above                    Optimal  130-159  mg/dL   Borderline  160-189  mg/dL   High  >190     mg/dL   Very High  - -  HDL-C >39 mg/dL 73  60  - -  Trlycerides <150 mg/dL 35  66  - -  Hemoglobin A1c <5.7 % 5.6 (NOTE)                                                                       According to the ADA Clinical Practice Recommendations for 2011, when HbA1c is used as a screening test:   >=6.5%   Diagnostic of Diabetes Mellitus           (if abnormal result  is confirmed)  5.7-6.4%   Increased risk of developing Diabetes Mellitus   References:Diagnosis and Classification of Diabetes Mellitus,Diabetes [id number] 1):S62-S69 and Standards of Medical Care in         Diabetes - 2011,Diabetes Care,2011,34  (Suppl 1):S11-S61.  - - -  PH, Arterial 7.35 - 7.45 - - 7.369  -  PCO2 arterial 32 - 48 mmHg - - 45.7  -  Bicarbonate 20.0 - 28.0 mmol/L - - 26.3  24.4  -  TCO2 22 - 32 mmol/L - - '28  26  25  28   '$ Acid-base deficit 0.0 - 2.0 mmol/L - - 1.0  -  O2 Saturation % - - 91  72  -    Capillary Blood Glucose: No results found for: "GLUCAP"   Exercise Target Goals: Exercise Program Goal: Individual exercise prescription set using results from initial 6 min walk test and THRR while considering  patient's activity barriers and safety.   Exercise Prescription Goal: Initial exercise prescription builds to 30-45 minutes a day of aerobic activity, 2-3 days per week.  Home exercise guidelines will be given to patient during program as part of exercise prescription that the participant will acknowledge.  Activity Barriers & Risk Stratification:   6 Minute Walk:   Oxygen Initial Assessment:  Oxygen Re-Evaluation:   Oxygen Discharge (Final Oxygen Re-Evaluation):   Initial Exercise Prescription:   Perform Capillary Blood Glucose checks as needed.  Exercise Prescription Changes:   Exercise Prescription Changes     Row Name 12/05/22 1633             Response to Exercise   Blood Pressure (Admit) 108/60       Blood Pressure (Exercise) 146/56       Blood Pressure (Exit) 122/70       Heart Rate (Admit) 77 bpm       Heart Rate (Exercise) 95 bpm       Heart Rate (Exit) 71 bpm       Rating of Perceived Exertion (Exercise) 12       Perceived Dyspnea (Exercise) 0       Symptoms 0       Comments Reviewed MET's and goals       Duration Progress to 30 minutes of  aerobic without signs/symptoms of physical distress       Intensity THRR unchanged         Progression   Progression Continue to progress workloads  to maintain intensity without signs/symptoms of physical distress.       Average METs 1.7         Resistance Training   Training Prescription No         T5 Nustep   Level 2       SPM 72       Minutes 30       METs 1.7         Home Exercise Plan   Plans to continue exercise at Home (comment)       Frequency Add 2 additional days to program exercise sessions.       Initial Home Exercises Provided 11/07/22                Exercise Comments:   Exercise Comments     Row Name 11/26/22 1630 12/03/22 1645 12/05/22 1641       Exercise Comments Pt has been absent since 11/21/22. Will review education upon pt return. Pt returned for exercise today. Let patient become re-acclimated with exercise today, will review education soon. Reviewed MET's and goals. Pt tolerated exercise well with an average MET level of 1.7. Pt has been absent, but was archieving 2.1 average MET's before and today achived 1.7. Will work on increasing [place] and increasing strength as she retunrs to exercise.              Exercise Goals and Review:   Exercise Goals Re-Evaluation :  Exercise Goals Re-Evaluation     Row Name 12/05/22 1636             Exercise Goal Re-Evaluation   Exercise Goals Review Increase Physical Activity;Understanding of Exercise Prescription;Knowledge and understanding of Target Heart Rate Range (THRR);Able to understand and use rate of perceived exertion (RPE) scale;Increase Strength and Stamina       Comments Reviewed MET's and goals. Pt tolerated exercise well with an average MET level of 1.7. Pt has been absent, but was archieving 2.1 average MET's before and today achived 1.7. Will work on increasing [place] and increasing strength as she retunrs to exercise.       Expected Outcomes Will continue to monitor pt and progress workloads as tolerated without sign or symptom                Discharge Exercise Prescription (  Final Exercise Prescription Changes):  Exercise  Prescription Changes - 12/05/22 1633       Response to Exercise   Blood Pressure (Admit) 108/60    Blood Pressure (Exercise) 146/56    Blood Pressure (Exit) 122/70    Heart Rate (Admit) 77 bpm    Heart Rate (Exercise) 95 bpm    Heart Rate (Exit) 71 bpm    Rating of Perceived Exertion (Exercise) 12    Perceived Dyspnea (Exercise) 0    Symptoms 0    Comments Reviewed MET's and goals    Duration Progress to 30 minutes of  aerobic without signs/symptoms of physical distress    Intensity THRR unchanged      Progression   Progression Continue to progress workloads to maintain intensity without signs/symptoms of physical distress.    Average METs 1.7      Resistance Training   Training Prescription No      T5 Nustep   Level 2    SPM 72    Minutes 30    METs 1.7      Home Exercise Plan   Plans to continue exercise at Home (comment)    Frequency Add 2 additional days to program exercise sessions.    Initial Home Exercises Provided 11/07/22             Nutrition:  Target Goals: Understanding of nutrition guidelines, daily intake of sodium '1500mg'$ , cholesterol '200mg'$ , calories 30% from fat and 7% or less from saturated fats, daily to have 5 or more servings of fruits and vegetables.  Biometrics:    Nutrition Therapy Plan and Nutrition Goals:  Nutrition Therapy & Goals - 12/21/22 1446       Nutrition Therapy   Diet Heart Healthy Diet    Drug/Food Interactions Statins/Certain Fruits      Personal Nutrition Goals   Nutrition Goal Patient to identify strategies for improving cardiovascular risk by attending the weekly Pritikin education series    Personal Goal #2 Patient to improve diet quality by using the plate method as a guide for meal planning to include lean protein/plant protein, fruits, vegetables, whole grains, and nonfat dairy as part of a heart healthy diet    Personal Goal #3 Patient to limit to '1500mg'$  of sodium daily.    Comments Goals in progress. Sadee  continues to attend the Pritikin education and nutrition series regularly; however she has missed multiple sessions over the last month due to UTI/urology follow-up and chest pain + ED visit. She is down 14.5# (BMI 30.7) since starting with our program per her last attended session on 12/05/2022. Latrece will continue to benefit from participation in intensive cardiac rehab for nutrition support, exercise, and lifestyle modification.      Intervention Plan   Intervention Prescribe, educate and counsel regarding individualized specific dietary modifications aiming towards targeted core components such as weight, hypertension, lipid management, diabetes, heart failure and other comorbidities.;Nutrition handout(s) given to patient.    Expected Outcomes Short Term Goal: Understand basic principles of dietary content, such as calories, fat, sodium, cholesterol and nutrients.;Long Term Goal: Adherence to prescribed nutrition plan.             Nutrition Assessments:  MEDIFICTS Score Key: ?70 Need to make dietary changes  40-70 Heart Healthy Diet ? 40 Therapeutic Level Cholesterol Diet    Picture Your Plate Scores: [id number] Unhealthy dietary pattern with much room for improvement. 41-50 Dietary pattern unlikely to meet recommendations for good health and room for improvement. 51-60 More  healthful dietary pattern, with some room for improvement.  >60 Healthy dietary pattern, although there may be some specific behaviors that could be improved.    Nutrition Goals Re-Evaluation:  Nutrition Goals Re-Evaluation     [place] Name 11/21/22 1536 12/21/22 1446           Goals   Current Weight 201 lb 11.5 oz (91.5 kg) 196 lb 3.4 oz (89 kg)  wt from last attended session on 12/05/22      Comment no new labs at this time; LDL 88- lipids WNL --      Expected Outcome Goals in progress. Orel continues to attend the Pritikin education and nutrition series regularly. She has better understanding of heart healthy  nutrition goals including reading food labels for sodium and increased fiber intake. She is down 9# (BMI 31.59) since starting with our program. Yasameen will continue to benefit from participation in intensive cardiac rehab for nutrition support, exercise, and lifestyle modification. Goals in progress. Jazminne continues to attend the Pritikin education and nutrition series regularly; however she has missed multiple sessions over the last month due to UTI/urology follow-up and chest pain + ED visit. She is down 14.5# (BMI 30.7) since starting with our program per her last attended session on 12/05/2022. Kensy will continue to benefit from participation in intensive cardiac rehab for nutrition support, exercise, and lifestyle modification.               Nutrition Goals Re-Evaluation:  Nutrition Goals Re-Evaluation     [place] Name 11/21/22 1536 12/21/22 1446           Goals   Current Weight 201 lb 11.5 oz (91.5 kg) 196 lb 3.4 oz (89 kg)  wt from last attended session on 12/05/22      Comment no new labs at this time; LDL 88- lipids WNL --      Expected Outcome Goals in progress. Jaeonna continues to attend the Pritikin education and nutrition series regularly. She has better understanding of heart healthy nutrition goals including reading food labels for sodium and increased fiber intake. She is down 9# (BMI 31.59) since starting with our program. Joyah will continue to benefit from participation in intensive cardiac rehab for nutrition support, exercise, and lifestyle modification. Goals in progress. Cassia continues to attend the Pritikin education and nutrition series regularly; however she has missed multiple sessions over the last month due to UTI/urology follow-up and chest pain + ED visit. She is down 14.5# (BMI 30.7) since starting with our program per her last attended session on 12/05/2022. Evelyne will continue to benefit from participation in intensive cardiac rehab for nutrition support, exercise, and  lifestyle modification.               Nutrition Goals Discharge (Final Nutrition Goals Re-Evaluation):  Nutrition Goals Re-Evaluation - 12/21/22 1446       Goals   Current Weight 196 lb 3.4 oz (89 kg)   wt from last attended session on 12/05/22   Expected Outcome Goals in progress. Marabella continues to attend the Pritikin education and nutrition series regularly; however she has missed multiple sessions over the last month due to UTI/urology follow-up and chest pain + ED visit. She is down 14.5# (BMI 30.7) since starting with our program per her last attended session on 12/05/2022. Vennela will continue to benefit from participation in intensive cardiac rehab for nutrition support, exercise, and lifestyle modification.             Psychosocial:  Target Goals: Acknowledge presence or absence of significant depression and/or stress, maximize coping skills, provide positive support system. Participant is able to verbalize types and ability to use techniques and skills needed for reducing stress and depression.  Initial Review & Psychosocial Screening:   Quality of Life Scores:  Scores of 19 and below usually indicate a poorer quality of life in these areas.  A difference of  2-3 points is a clinically meaningful difference.  A difference of 2-3 points in the total score of the Quality of Life Index has been associated with significant improvement in overall quality of life, self-image, physical symptoms, and general health in studies assessing change in quality of life.  PHQ-9: Review Flowsheet       10/16/2022  Depression screen PHQ 2/9  Decreased Interest 0  Down, Depressed, Hopeless 0  PHQ - 2 Score 0   Interpretation of Total Score  Total Score Depression Severity:  1-4 = Minimal depression, 5-9 = Mild depression, 10-14 = Moderate depression, 15-19 = Moderately severe depression, 20-27 = Severe depression   Psychosocial Evaluation and Intervention:   Psychosocial  Re-Evaluation:  Psychosocial Re-Evaluation     [place] Name 11/13/22 1533 12/12/22 1653 01/08/23 1147         Psychosocial Re-Evaluation   Current issues with None Identified Current Stress Concerns Current Stress Concerns     Comments -- Chhim has had some issure due to a prolapsed bladder. Last exercise day was 12/05/21 Netzer has had some issure due to a prolapsed bladder. Last exercise day was 12/05/21. Giesel is scheduled to return to cardiac rehab on 01/09/23     Interventions Encouraged to attend Cardiac Rehabilitation for the exercise Encouraged to attend Cardiac Rehabilitation for the exercise Encouraged to attend Cardiac Rehabilitation for the exercise     Continue Psychosocial Services  No Follow up required No Follow up required No Follow up required       Initial Review   Source of Stress Concerns -- Chronic Illness Chronic Illness     Comments -- Will continue to monitor and offer support as needed Will continue to monitor and offer support as needed              Psychosocial Discharge (Final Psychosocial Re-Evaluation):  Psychosocial Re-Evaluation - 01/08/23 1147       Psychosocial Re-Evaluation   Current issues with Current Stress Concerns    Comments Meineke has had some issure due to a prolapsed bladder. Last exercise day was 12/05/21. Froese is scheduled to return to cardiac rehab on 01/09/23    Interventions Encouraged to attend Cardiac Rehabilitation for the exercise    Continue Psychosocial Services  No Follow up required      Initial Review   Source of Stress Concerns Chronic Illness    Comments Will continue to monitor and offer support as needed             Vocational Rehabilitation: Provide vocational rehab assistance to qualifying candidates.   Vocational Rehab Evaluation & Intervention:   Education: Education Goals: Education classes will be provided on a weekly basis, covering required topics. Participant will state understanding/return  demonstration of topics presented.    Education     Row Name 11/09/22 1500     Education   Cardiac Education Topics Pritikin   Architect Education   General Education Heart Disease Risk Reduction   Instruction Review Code  1- Verbalizes Understanding   Class Start Time 1400   Class Stop Time 1440   Class Time Calculation (min) 40 min    Row Name 11/14/22 1500     Education   Cardiac Education Topics Pritikin   Financial trader   Weekly Topic Fast and Healthy Breakfasts   Instruction Review Code 1- Verbalizes Understanding   Class Start Time [id number]   Class Stop Time 1438   Class Time Calculation (min) 43 min    [place] Name 11/16/22 1500     Education   Cardiac Education Topics Pritikin   Lexicographer Nutrition   Nutrition Overview of the [place] Eating Plan   Instruction Review Code 1- Verbalizes Understanding   Class Start Time 1400   Class Stop Time 1440   Class Time Calculation (min) 40 min    [place] Name 11/19/22 1200     Education   Cardiac Education Topics Pritikin   [employer]     Workshops   Educator Exercise Physiologist   [employer] Exercise   Exercise Workshop Hotel manager and Fall Prevention   Instruction Review Code 1- Verbalizes Understanding   Class Start Time 1140   Class Stop Time 1221   Class Time Calculation (min) 41 min    [place] Name 11/21/22 1600     Education   Cardiac Education Topics [employer]   Educator Dietitian   Weekly Topic Personalizing Your Pritikin Plate   Instruction Review Code 1- Verbalizes Understanding   Class Start Time 1403   Class Stop Time 1458   Class Time Calculation (min) 55 min    [place] Name 12/03/22 1528     Education   Cardiac Education Topics [place]   [employer]     Workshops    Educator Exercise Physiologist   [employer] Psychosocial   Psychosocial Workshop Other  Focused goals and sustainable changes   Instruction Review Code 1- Verbalizes Understanding   Class Start Time 1402   Class Stop Time 1445   Class Time Calculation (min) 43 min     [employer]ing School   Instruction Review Code 1- [employer] Understanding    [place] Name 12/05/22 1600     Education   Cardiac Education Topics Pritikin   Financial trader   Weekly Topic Tasty Appetizers and Snacks   Instruction Review Code 1- Verbalizes Understanding   Class Start Time 1402   Class Stop Time 1448   Class Time Calculation (min) 46 min            Core Videos: Exercise    Move It!  Clinical staff conducted group or individual video education with verbal and written material and guidebook.  Patient learns the recommended Pritikin exercise program. Exercise with the goal of living a long, healthy life. Some of the health benefits of exercise include controlled diabetes, healthier blood pressure levels, improved cholesterol levels, improved heart and lung capacity, improved sleep, and better body composition. Everyone should speak with their doctor before starting or changing an exercise routine.  Biomechanical Limitations Clinical staff conducted group or individual video education with verbal and written material and guidebook.  Patient learns how biomechanical limitations can impact exercise and how we can mitigate and possibly overcome limitations to have an  impactful and balanced exercise routine.  Body Composition Clinical staff conducted group or individual video education with verbal and written material and guidebook.  Patient learns that body composition (ratio of muscle mass to fat mass) is a key component to assessing overall fitness, rather than body weight alone. Increased fat mass, especially visceral belly fat, can put [place] at increased risk for  metabolic syndrome, type 2 diabetes, heart disease, and even death. It is recommended to combine diet and exercise (cardiovascular and resistance training) to improve your body composition. Seek guidance from your physician and exercise physiologist before implementing an exercise routine.  Exercise Action Plan Clinical staff conducted group or individual video education with verbal and written material and guidebook.  Patient learns the recommended strategies to achieve and enjoy long-term exercise adherence, including variety, self-motivation, self-efficacy, and positive decision making. Benefits of exercise include fitness, good health, weight management, more energy, better sleep, less stress, and overall well-being.  Medical   Heart Disease Risk Reduction Clinical staff conducted group or individual video education with verbal and written material and guidebook.  Patient learns our heart is our most vital organ as it circulates oxygen, nutrients, white blood cells, and hormones throughout the entire body, and carries waste away. Data supports a plant-based eating plan like the Pritikin Program for its effectiveness in slowing progression of and reversing heart disease. The video provides a number of recommendations to address heart disease.   Metabolic Syndrome and Belly Fat  Clinical staff conducted group or individual video education with verbal and written material and guidebook.  Patient learns what metabolic syndrome is, how it leads to heart disease, and how one can reverse it and keep it from coming back. You have metabolic syndrome if you have 3 of the following 5 criteria: abdominal obesity, high blood pressure, high triglycerides, low HDL cholesterol, and high blood sugar.  Hypertension and Heart Disease Clinical staff conducted group or individual video education with verbal and written material and guidebook.  Patient learns that high blood pressure, or hypertension, is very common  in the [place]. Hypertension is largely due to excessive salt intake, but other important risk factors include being overweight, physical inactivity, drinking too much alcohol, smoking, and not eating enough potassium from fruits and vegetables. High blood pressure is a leading risk factor for heart attack, stroke, congestive heart failure, dementia, kidney failure, and premature death. Long-term effects of excessive salt intake include stiffening of the arteries and thickening of heart muscle and organ damage. Recommendations include ways to reduce hypertension and the risk of heart disease.  Diseases of Our Time - Focusing on Diabetes Clinical staff conducted group or individual video education with verbal and written material and guidebook.  Patient learns why the best way to stop diseases of our time is prevention, through food and other lifestyle changes. Medicine (such as prescription pills and surgeries) is often only a Band-Aid on the problem, not a long-term solution. Most common diseases of our time include obesity, type 2 diabetes, hypertension, heart disease, and cancer. The Pritikin Program is recommended and has been proven to help reduce, reverse, and/or prevent the damaging effects of metabolic syndrome.  Nutrition   Overview of the Pritikin Eating Plan  Clinical staff conducted group or individual video education with verbal and written material and guidebook.  Patient learns about the [place] for disease risk reduction. The [place] emphasizes a wide variety of unrefined, minimally-processed carbohydrates, like fruits, vegetables, whole grains, and legumes. Go,  Caution, and Stop food choices are explained. Plant-based and lean animal proteins are emphasized. Rationale provided for low sodium intake for blood pressure control, low added sugars for blood sugar stabilization, and low added fats and oils for coronary artery disease risk reduction and weight  management.  Calorie Density  Clinical staff conducted group or individual video education with verbal and written material and guidebook.  Patient learns about calorie density and how it impacts the Pritikin Eating Plan. Knowing the characteristics of the food you choose will help you decide whether those foods will lead to weight gain or weight loss, and whether you want to consume more or less of them. Weight loss is usually a side effect of the Pritikin Eating Plan because of its focus on low calorie-dense foods.  Label Reading  Clinical staff conducted group or individual video education with verbal and written material and guidebook.  Patient learns about the Pritikin recommended label reading guidelines and corresponding recommendations regarding calorie density, added sugars, sodium content, and whole grains.  Dining Out - Part 1  Clinical staff conducted group or individual video education with verbal and written material and guidebook.  Patient learns that restaurant meals can be sabotaging because they can be so high in calories, fat, sodium, and/or sugar. Patient learns recommended strategies on how to positively address this and avoid unhealthy pitfalls.  Facts on Fats  Clinical staff conducted group or individual video education with verbal and written material and guidebook.  Patient learns that lifestyle modifications can be just as effective, if not more so, as many medications for lowering your risk of heart disease. A Pritikin lifestyle can help to reduce your risk of inflammation and atherosclerosis (cholesterol build-up, or plaque, in the artery walls). Lifestyle interventions such as dietary choices and physical activity address the cause of atherosclerosis. A review of the types of fats and their impact on blood cholesterol levels, along with dietary recommendations to reduce fat intake is also included.  Nutrition Action Plan  Clinical staff conducted group or individual  video education with verbal and written material and guidebook.  Patient learns how to incorporate Pritikin recommendations into their lifestyle. Recommendations include planning and keeping personal health goals in mind as an important part of their success.  Healthy Mind-Set    Healthy Minds, Bodies, Hearts  Clinical staff conducted group or individual video education with verbal and written material and guidebook.  Patient learns how to identify when they are stressed. Video will discuss the impact of that stress, as well as the many benefits of stress management. Patient will also be introduced to stress management techniques. The way we think, act, and feel has an impact on our hearts.  How Our Thoughts Can Heal Our Hearts  Clinical staff conducted group or individual video education with verbal and written material and guidebook.  Patient learns that negative thoughts can cause depression and anxiety. This can result in negative lifestyle behavior and serious health problems. Cognitive behavioral therapy is an effective method to help control our thoughts in order to change and improve our emotional outlook.  Additional Videos:  Exercise    Improving Performance  Clinical staff conducted group or individual video education with verbal and written material and guidebook.  Patient learns to use a non-linear approach by alternating intensity levels and lengths of time spent exercising to help burn more calories and lose more body fat. Cardiovascular exercise helps improve heart health, metabolism, hormonal balance, blood sugar control, and recovery from fatigue. Resistance  training improves strength, endurance, balance, coordination, reaction time, metabolism, and muscle mass. Flexibility exercise improves circulation, posture, and balance. Seek guidance from your physician and exercise physiologist before implementing an exercise routine and learn your capabilities and proper form for all  exercise.  Introduction to Yoga  Clinical staff conducted group or individual video education with verbal and written material and guidebook.  Patient learns about yoga, a discipline of the coming together of mind, breath, and body. The benefits of yoga include improved flexibility, improved range of motion, better posture and core strength, increased lung function, weight loss, and positive self-image. Yoga's heart health benefits include lowered blood pressure, healthier heart rate, decreased cholesterol and triglyceride levels, improved immune function, and reduced stress. Seek guidance from your physician and exercise physiologist before implementing an exercise routine and learn your capabilities and proper form for all exercise.  Medical   Aging: Enhancing Your Quality of Life  Clinical staff conducted group or individual video education with verbal and written material and guidebook.  Patient learns key strategies and recommendations to stay in good physical health and enhance quality of life, such as prevention strategies, having an advocate, securing a [place], and keeping a list of medications and system for tracking them. It also discusses how to avoid risk for bone loss.  Biology of Weight Control  Clinical staff conducted group or individual video education with verbal and written material and guidebook.  Patient learns that weight gain occurs because we consume more calories than we burn (eating more, moving less). Even if your body weight is normal, you may have higher ratios of fat compared to muscle mass. Too much body fat puts you at increased risk for cardiovascular disease, heart attack, stroke, type 2 diabetes, and obesity-related cancers. In addition to exercise, following the [place] can help reduce your risk.  Decoding Lab Results  Clinical staff conducted group or individual video education with verbal and written material and  guidebook.  Patient learns that lab test reflects one measurement whose values change over time and are influenced by many factors, including medication, stress, sleep, exercise, food, hydration, pre-existing medical conditions, and more. It is recommended to use the knowledge from this video to become more involved with your lab results and evaluate your numbers to speak with your doctor.   Diseases of Our Time - Overview  Clinical staff conducted group or individual video education with verbal and written material and guidebook.  Patient learns that according to the CDC, 50% to 70% of chronic diseases (such as obesity, type 2 diabetes, elevated lipids, hypertension, and heart disease) are avoidable through lifestyle improvements including healthier food choices, listening to satiety cues, and increased physical activity.  Sleep Disorders Clinical staff conducted group or individual video education with verbal and written material and guidebook.  Patient learns how good quality and duration of sleep are important to overall health and well-being. Patient also learns about sleep disorders and how they impact health along with recommendations to address them, including discussing with a physician.  Nutrition  Dining Out - Part 2 Clinical staff conducted group or individual video education with verbal and written material and guidebook.  Patient learns how to plan ahead and communicate in order to maximize their dining experience in a healthy and nutritious manner. Included are recommended food choices based on the type of restaurant the patient is visiting.   Fueling a Best boy conducted group or individual video  education with verbal and written material and guidebook.  There is a strong connection between our food choices and our health. Diseases like obesity and type 2 diabetes are very prevalent and are in large-part due to lifestyle choices. The Pritikin Eating Plan  provides plenty of food and hunger-curbing satisfaction. It is easy to follow, affordable, and helps reduce health risks.  Menu Workshop  Clinical staff conducted group or individual video education with verbal and written material and guidebook.  Patient learns that restaurant meals can sabotage health goals because they are often packed with calories, fat, sodium, and sugar. Recommendations include strategies to plan ahead and to communicate with the manager, chef, or server to help order a healthier meal.  Planning Your Eating Strategy  Clinical staff conducted group or individual video education with verbal and written material and guidebook.  Patient learns about the [place] and its benefit of reducing the risk of disease. The [place] does not focus on calories. Instead, it emphasizes high-quality, nutrient-rich foods. By knowing the characteristics of the foods, we choose, we can determine their calorie density and make informed decisions.  Targeting Your Nutrition Priorities  Clinical staff conducted group or individual video education with verbal and written material and guidebook.  Patient learns that lifestyle habits have a tremendous impact on disease risk and progression. This video provides eating and physical activity recommendations based on your personal health goals, such as reducing LDL cholesterol, losing weight, preventing or controlling type 2 diabetes, and reducing high blood pressure.  Vitamins and Minerals  Clinical staff conducted group or individual video education with verbal and written material and guidebook.  Patient learns different ways to obtain key vitamins and minerals, including through a recommended healthy diet. It is important to discuss all supplements you take with your doctor.   Healthy Mind-Set    Smoking Cessation  Clinical staff conducted group or individual video education with verbal and written material and guidebook.   Patient learns that cigarette smoking and tobacco addiction pose a serious health risk which affects millions of people. Stopping smoking will significantly reduce the risk of heart disease, lung disease, and many forms of cancer. Recommended strategies for quitting are covered, including working with your doctor to develop a successful plan.  Culinary   Becoming a Financial trader conducted group or individual video education with verbal and written material and guidebook.  Patient learns that cooking at home can be healthy, cost-effective, quick, and puts them in control. Keys to cooking healthy recipes will include looking at your recipe, assessing your equipment needs, planning ahead, making it simple, choosing cost-effective seasonal ingredients, and limiting the use of added fats, salts, and sugars.  Cooking - Breakfast and Snacks  Clinical staff conducted group or individual video education with verbal and written material and guidebook.  Patient learns how important breakfast is to satiety and nutrition through the entire day. Recommendations include key foods to eat during breakfast to help stabilize blood sugar levels and to prevent overeating at meals later in the day. Planning ahead is also a key component.  Cooking - Human resources officer conducted group or individual video education with verbal and written material and guidebook.  Patient learns eating strategies to improve overall health, including an approach to cook more at home. Recommendations include thinking of animal protein as a side on your plate rather than center stage and focusing instead on lower calorie dense options like vegetables, fruits, whole  grains, and plant-based proteins, such as beans. Making sauces in large quantities to freeze for later and leaving the skin on your vegetables are also recommended to maximize your experience.  Cooking - Healthy Salads and Dressing Clinical staff  conducted group or individual video education with verbal and written material and guidebook.  Patient learns that vegetables, fruits, whole grains, and legumes are the foundations of the [place]. Recommendations include how to incorporate each of these in flavorful and healthy salads, and how to create homemade salad dressings. Proper handling of ingredients is also covered. Cooking - Soups and [employer] - Soups and Desserts Clinical staff conducted group or individual video education with verbal and written material and guidebook.  Patient learns that Pritikin soups and desserts make for easy, nutritious, and delicious snacks and meal components that are low in sodium, fat, sugar, and calorie density, while high in vitamins, minerals, and filling fiber. Recommendations include simple and healthy ideas for soups and desserts.   Overview     The Pritikin Solution Program Overview Clinical staff conducted group or individual video education with verbal and written material and guidebook.  Patient learns that the results of the [place] Program have been documented in more than 100 articles published in peer-reviewed journals, and the benefits include reducing risk factors for (and, in some cases, even reversing) high cholesterol, high blood pressure, type 2 diabetes, obesity, and more! An overview of the three key pillars of the Pritikin Program will be covered: eating well, doing regular exercise, and having a healthy mind-set.  WORKSHOPS  Exercise: Exercise Basics: Building Your Action Plan Clinical staff led group instruction and group discussion with PowerPoint presentation and patient guidebook. To enhance the learning environment the use of posters, models and videos may be added. At the conclusion of this workshop, patients will comprehend the difference between physical activity and exercise, as well as the benefits of incorporating both, into their routine. Patients will  understand the FITT (Frequency, Intensity, Time, and Type) principle and how to use it to build an exercise action plan. In addition, safety concerns and other considerations for exercise and cardiac rehab will be addressed by the presenter. The purpose of this lesson is to promote a comprehensive and effective weekly exercise routine in order to improve patients' overall level of fitness.   Managing Heart Disease: Your Path to a Healthier Heart Clinical staff led group instruction and group discussion with PowerPoint presentation and patient guidebook. To enhance the learning environment the use of posters, models and videos may be added.At the conclusion of this workshop, patients will understand the anatomy and physiology of the heart. Additionally, they will understand how Pritikin's three pillars impact the risk factors, the progression, and the management of heart disease.  The purpose of this lesson is to provide a high-level overview of the heart, heart disease, and how the Pritikin lifestyle positively impacts risk factors.  Exercise Biomechanics Clinical staff led group instruction and group discussion with PowerPoint presentation and patient guidebook. To enhance the learning environment the use of posters, models and videos may be added. Patients will learn how the structural parts of their bodies function and how these functions impact their daily activities, movement, and exercise. Patients will learn how to promote a neutral spine, learn how to manage pain, and identify ways to improve their physical movement in order to promote healthy living. The purpose of this lesson is to expose patients to common physical limitations that impact physical activity. Participants  will learn practical ways to adapt and manage aches and pains, and to minimize their effect on regular exercise. Patients will learn how to maintain good posture while sitting, walking, and lifting.  Balance Training  and Fall Prevention  Clinical staff led group instruction and group discussion with PowerPoint presentation and patient guidebook. To enhance the learning environment the use of posters, models and videos may be added. At the conclusion of this workshop, patients will understand the importance of their sensorimotor skills (vision, proprioception, and the vestibular system) in maintaining their ability to balance as they age. Patients will apply a variety of balancing exercises that are appropriate for their current level of function. Patients will understand the common causes for poor balance, possible solutions to these problems, and ways to modify their physical environment in order to minimize their fall risk. The purpose of this lesson is to teach patients about the importance of maintaining balance as they age and ways to minimize their risk of falling.  WORKSHOPS   Nutrition:  Fueling a Scientist, research (physical sciences) led group instruction and group discussion with PowerPoint presentation and patient guidebook. To enhance the learning environment the use of posters, models and videos may be added. Patients will review the foundational principles of the [place] and understand what constitutes a serving size in each of the food groups. Patients will also learn Pritikin-friendly foods that are better choices when away from home and review make-ahead meal and snack options. Calorie density will be reviewed and applied to three nutrition priorities: weight maintenance, weight loss, and weight gain. The purpose of this lesson is to reinforce (in a group setting) the key concepts around what patients are recommended to eat and how to apply these guidelines when away from home by planning and selecting Pritikin-friendly options. Patients will understand how calorie density may be adjusted for different weight management goals.  Mindful Eating  Clinical staff led group instruction and group  discussion with PowerPoint presentation and patient guidebook. To enhance the learning environment the use of posters, models and videos may be added. Patients will briefly review the concepts of the [place] and the importance of low-calorie dense foods. The concept of mindful eating will be introduced as well as the importance of paying attention to internal hunger signals. Triggers for non-hunger eating and techniques for dealing with triggers will be explored. The purpose of this lesson is to provide patients with the opportunity to review the basic principles of the [place], discuss the value of eating mindfully and how to measure internal cues of hunger and fullness using the Hunger Scale. Patients will also discuss reasons for non-hunger eating and learn strategies to use for controlling emotional eating.  Targeting Your Nutrition Priorities Clinical staff led group instruction and group discussion with PowerPoint presentation and patient guidebook. To enhance the learning environment the use of posters, models and videos may be added. Patients will learn how to determine their genetic susceptibility to disease by reviewing their family history. Patients will gain insight into the importance of diet as part of an overall healthy lifestyle in mitigating the impact of genetics and other environmental insults. The purpose of this lesson is to provide patients with the opportunity to assess their personal nutrition priorities by looking at their family history, their own health history and current risk factors. Patients will also be able to discuss ways of prioritizing and modifying the [place] for their highest risk areas  Menu  Clinical staff led group instruction and group discussion with PowerPoint presentation and patient guidebook. To enhance the learning environment the use of posters, models and videos may be added. Using menus brought in from [employer],  or printed from [employer], patients will apply the [place] dining out guidelines that were presented in the [employer] video. Patients will also be able to practice these guidelines in a variety of provided scenarios. The purpose of this lesson is to provide patients with the opportunity to practice hands-on learning of the [place] with actual menus and practice scenarios.  Label Reading Clinical staff led group instruction and group discussion with PowerPoint presentation and patient guidebook. To enhance the learning environment the use of posters, models and videos may be added. Patients will review and discuss the Pritikin label reading guidelines presented in Pritikin's Label Reading Educational series video. Using fool labels brought in from local grocery stores and markets, patients will apply the label reading guidelines and determine if the packaged food meet the Pritikin guidelines. The purpose of this lesson is to provide patients with the opportunity to review, discuss, and practice hands-on learning of the Pritikin Label Reading guidelines with actual packaged food labels. [place] Workshops are designed to teach patients ways to prepare quick, simple, and affordable recipes at home. The importance of nutrition's role in chronic disease risk reduction is reflected in its emphasis in the overall Pritikin program. By learning how to prepare essential core Pritikin Eating Plan recipes, patients will increase control over what they eat; be able to customize the flavor of foods without the use of added salt, sugar, or fat; and improve the quality of the food they consume. By learning a set of core recipes which are easily assembled, quickly prepared, and affordable, patients are more likely to prepare more healthy foods at home. These workshops focus on convenient breakfasts, simple entres, side dishes, and desserts which  can be prepared with minimal effort and are consistent with nutrition recommendations for cardiovascular risk reduction. Cooking [employer] are taught by a Engineer, materials (RD) who has been trained by the [employer]. The chef or RD has a clear understanding of the importance of minimizing - if not completely eliminating - added fat, sugar, and sodium in recipes. Throughout the series of [place] Workshop sessions, patients will learn about healthy ingredients and efficient methods of cooking to build confidence in their capability to prepare    Cooking School weekly topics:  Adding Flavor- Sodium-Free  Fast and Healthy Breakfasts  Powerhouse Plant-Based Proteins  Satisfying Salads and Dressings  Simple Sides and Sauces  International Cuisine-Spotlight on the [place]ones  Delicious Desserts  Savory Soups  Efficiency Cooking - Meals in a Snap  Tasty Appetizers and Snacks  Comforting Weekend Breakfasts  One-Pot Wonders   Fast Evening Meals  Easy [place] (Psychosocial): New Thoughts, New Behaviors Clinical staff led group instruction and group discussion with PowerPoint presentation and patient guidebook. To enhance the learning environment the use of posters, models and videos may be added. Patients will learn and practice techniques for developing effective health and lifestyle goals. Patients will be able to effectively apply the goal setting process learned to develop at least one new personal goal.  The purpose of this lesson is to expose patients to a new skill set of behavior modification techniques such as techniques  setting SMART goals, overcoming barriers, and achieving new thoughts and new behaviors.  Managing Moods and Relationships Clinical staff led group instruction and group discussion with PowerPoint presentation and patient guidebook. To enhance the learning  environment the use of posters, models and videos may be added. Patients will learn how emotional and chronic stress factors can impact their health and relationships. They will learn healthy ways to manage their moods and utilize positive coping mechanisms. In addition, ICR patients will learn ways to improve communication skills. The purpose of this lesson is to expose patients to ways of understanding how one's mood and health are intimately connected. Developing a healthy outlook can help build positive relationships and connections with others. Patients will understand the importance of utilizing effective communication skills that include actively listening and being heard. They will learn and understand the importance of the "4 Cs" and especially Connections in fostering of a Healthy Mind-Set.  Healthy Sleep for a Healthy Heart Clinical staff led group instruction and group discussion with PowerPoint presentation and patient guidebook. To enhance the learning environment the use of posters, models and videos may be added. At the conclusion of this workshop, patients will be able to demonstrate knowledge of the importance of sleep to overall health, well-being, and quality of life. They will understand the symptoms of, and treatments for, common sleep disorders. Patients will also be able to identify daytime and nighttime behaviors which impact sleep, and they will be able to apply these tools to help manage sleep-related challenges. The purpose of this lesson is to provide patients with a general overview of sleep and outline the importance of quality sleep. Patients will learn about a few of the most common sleep disorders. Patients will also be introduced to the concept of "sleep hygiene," and discover ways to self-manage certain sleeping problems through simple daily behavior changes. Finally, the workshop will motivate patients by clarifying the links between quality sleep and their goals of  heart-healthy living.   Recognizing and Reducing Stress Clinical staff led group instruction and group discussion with PowerPoint presentation and patient guidebook. To enhance the learning environment the use of posters, models and videos may be added. At the conclusion of this workshop, patients will be able to understand the types of stress reactions, differentiate between acute and chronic stress, and recognize the impact that chronic stress has on their health. They will also be able to apply different coping mechanisms, such as reframing negative self-talk. Patients will have the opportunity to practice a variety of stress management techniques, such as deep abdominal breathing, progressive muscle relaxation, and/or guided imagery.  The purpose of this lesson is to educate patients on the role of stress in their lives and to provide healthy techniques for coping with it.  Learning Barriers/Preferences:   Education Topics:  Knowledge Questionnaire Score:   Core Components/Risk Factors/Patient Goals at Admission:   Core Components/Risk Factors/Patient Goals Review:   Goals and Risk Factor Review     Row Name 11/13/22 1535 12/12/22 1654           Core Components/Risk Factors/Patient Goals Review   Personal Goals Review Weight Management/Obesity;Hypertension;Lipids Weight Management/Obesity;Hypertension;Lipids      Review Jadine is doing well with exercise at intensive cardiac rehab  for her fitness level. Some moderate and exertional exertional BP elevations have been noted at intensive cardiac rehab. Will continue to monitor  BP Jairy has been  doing well with exercise at intensive cardiac rehab  for her fitness level. Broadbent has  been out due to a prolasped bladder, urinary tract infection      Expected Outcomes Banford will continue to participate in intensive cardiac rehab for exercise, nutrition and lifestyle modifications Ramaswamy will continue to participate in intensive cardiac rehab for  exercise, nutrition and lifestyle modifications               Core Components/Risk Factors/Patient Goals at Discharge (Final Review):   Goals and Risk Factor Review - 12/12/22 1654       Core Components/Risk Factors/Patient Goals Review   Personal Goals Review Weight Management/Obesity;Hypertension;Lipids    Review Cordella has been  doing well with exercise at intensive cardiac rehab  for her fitness level. Kuyper has been out due to a prolasped bladder, urinary tract infection    Expected Outcomes Gilland will continue to participate in intensive cardiac rehab for exercise, nutrition and lifestyle modifications             ITP Comments:  ITP Comments     Row Name 11/13/22 1532 12/12/22 1652 01/08/23 1146       ITP Comments 30 Day ITP Review. Elsee has good attendance and participation in  intensive cardiac rehab 30 Day ITP Review. Cherysh has good  participation when in attendance at  intensive cardiac rehab. Saphronia has been out due to a recent UTI 30 Day ITP Review. Bridney is scheduled to return to intensive cardiac rehab on tomorrow 01/09/23. Staves will complete intensive cardiac rehab on 01/23/23              Comments: See ITP comments.Harrell Gave RN BSN

## 2023-01-08 NOTE — Telephone Encounter (Signed)
Spoke with Cranmer, she plans to return to exercise tomorrow.Harrell Gave RN BSN

## 2023-01-08 NOTE — Telephone Encounter (Addendum)
Called patient and gave her the following message  ----- Message from Sanda Klein, MD sent at 01/07/2023  7:39 PM EST ----- After she left, I dug a little deeper through her recent hospital stay and found that she had atrial fibrillation during that visit.  I ordered a 30-day event monitor, but did not call anybody to have it scheduled.  Could you please take care of that for me?  Thanks.  Went over some of the instructions and also sent instructions via MyChart. Told pt to call with any questions or concerns  She verbalized understanding

## 2023-01-09 ENCOUNTER — Encounter (HOSPITAL_COMMUNITY)
Admission: RE | Admit: 2023-01-09 | Discharge: 2023-01-09 | Disposition: A | Discharge status: Home / Self Care | Payer: Medicare HMO | Source: Ambulatory Visit | Attending: Cardiovascular Disease | Admitting: Cardiovascular Disease

## 2023-01-09 DIAGNOSIS — Z952 Presence of prosthetic heart valve: Secondary | ICD-10-CM | POA: Diagnosis not present

## 2023-01-09 NOTE — Progress Notes (Signed)
Patient returned to cardiac rehab today per Dr Sallyanne Kuster and exercised without difficulty.Harrell Gave RN BSN

## 2023-01-11 ENCOUNTER — Encounter (HOSPITAL_COMMUNITY): Payer: Medicare HMO

## 2023-01-14 ENCOUNTER — Encounter (HOSPITAL_COMMUNITY)
Admission: RE | Admit: 2023-01-14 | Discharge: 2023-01-14 | Disposition: A | Discharge status: Home / Self Care | Payer: Medicare HMO | Source: Ambulatory Visit | Attending: Cardiovascular Disease | Admitting: Cardiovascular Disease

## 2023-01-14 DIAGNOSIS — Z48812 Encounter for surgical aftercare following surgery on the circulatory system: Secondary | ICD-10-CM | POA: Diagnosis not present

## 2023-01-14 DIAGNOSIS — Z952 Presence of prosthetic heart valve: Secondary | ICD-10-CM | POA: Diagnosis not present

## 2023-01-16 ENCOUNTER — Encounter (HOSPITAL_COMMUNITY)
Admission: RE | Admit: 2023-01-16 | Discharge: 2023-01-16 | Disposition: A | Discharge status: Home / Self Care | Payer: Medicare HMO | Source: Ambulatory Visit | Attending: Cardiovascular Disease | Admitting: Cardiovascular Disease

## 2023-01-16 DIAGNOSIS — Z48812 Encounter for surgical aftercare following surgery on the circulatory system: Secondary | ICD-10-CM | POA: Diagnosis not present

## 2023-01-16 DIAGNOSIS — Z952 Presence of prosthetic heart valve: Secondary | ICD-10-CM

## 2023-01-18 ENCOUNTER — Encounter (HOSPITAL_COMMUNITY)
Admission: RE | Admit: 2023-01-18 | Discharge: 2023-01-18 | Disposition: A | Discharge status: Home / Self Care | Payer: Medicare HMO | Source: Ambulatory Visit | Attending: Cardiovascular Disease | Admitting: Cardiovascular Disease

## 2023-01-18 VITALS — Ht 67.0 in | Wt 198.4 lb

## 2023-01-18 DIAGNOSIS — Z952 Presence of prosthetic heart valve: Secondary | ICD-10-CM | POA: Diagnosis not present

## 2023-01-18 DIAGNOSIS — Z48812 Encounter for surgical aftercare following surgery on the circulatory system: Secondary | ICD-10-CM | POA: Diagnosis not present

## 2023-01-21 ENCOUNTER — Encounter (HOSPITAL_COMMUNITY)
Admission: RE | Admit: 2023-01-21 | Discharge: 2023-01-21 | Disposition: A | Discharge status: Home / Self Care | Payer: Medicare HMO | Source: Ambulatory Visit | Attending: Cardiovascular Disease | Admitting: Cardiovascular Disease

## 2023-01-21 DIAGNOSIS — Z952 Presence of prosthetic heart valve: Secondary | ICD-10-CM

## 2023-01-21 DIAGNOSIS — Z48812 Encounter for surgical aftercare following surgery on the circulatory system: Secondary | ICD-10-CM | POA: Diagnosis not present

## 2023-01-23 ENCOUNTER — Encounter (HOSPITAL_COMMUNITY): Payer: Medicare HMO

## 2023-01-24 ENCOUNTER — Encounter: Payer: Self-pay | Admitting: *Deleted

## 2023-01-24 ENCOUNTER — Telehealth: Payer: Self-pay | Admitting: Cardiovascular Disease

## 2023-01-24 NOTE — Progress Notes (Signed)
01/07/23 Order for a cardiac event monitor was linked to patients office visit appointment with Dr. Sallyanne Kuster on that day.  Therefore, the monitor order never dropped to the [facility] monitor work que to be processed.  Order unlinked from 01/07/23 office visit and processed. Patient enrolled for [employer] to ship a 30 day cardiac event monitor to her address on record.

## 2023-01-24 NOTE — Telephone Encounter (Signed)
Pt called in wanting to know the status of her cardiac monitor coming in. She states [employer] has not called her

## 2023-01-24 NOTE — Telephone Encounter (Signed)
Spoke with pt regarding her [employer] monitor. Let pt know that I had our monitor team look into this and she discovered that order was not placed for monitor. Let pt know that we put the order in and monitor will be processed. Pt verbalizes understanding.

## 2023-01-25 ENCOUNTER — Encounter (HOSPITAL_COMMUNITY)
Admission: RE | Admit: 2023-01-25 | Discharge: 2023-01-25 | Disposition: A | Discharge status: Home / Self Care | Payer: Medicare HMO | Source: Ambulatory Visit | Attending: Cardiovascular Disease | Admitting: Cardiovascular Disease

## 2023-01-25 DIAGNOSIS — Z952 Presence of prosthetic heart valve: Secondary | ICD-10-CM

## 2023-01-25 DIAGNOSIS — Z48812 Encounter for surgical aftercare following surgery on the circulatory system: Secondary | ICD-10-CM | POA: Diagnosis not present

## 2023-01-25 NOTE — Progress Notes (Signed)
Discharge Progress Report  Patient Details  Name: Traci RaoSara O [name] MRN: [id number] Date of Birth: 15-Mar-1941 Referring Provider:   Flowsheet Row INTENSIVE CARDIAC REHAB ORIENT [facility]  Referring Provider Croitoru, Rachelle HoraMihai, MD        Number of Visits: [phone]  Reason for Discharge:  Patient reached a stable level of exercise. Patient independent in their exercise. Patient has met program and personal goals.  Smoking History:  Social History   Tobacco Use  Smoking Status Never   Passive exposure: Never  Smokeless Tobacco Never    Diagnosis:  09/04/22 TAVR (transcatheter aortic valve replacement)  ADL UCSD:   Initial Exercise Prescription:  Initial Exercise Prescription - 10/16/22 1200       Date of Initial Exercise RX and Referring Provider   Date 10/16/22    Referring Provider Croitoru, Rachelle HoraMihai, MD    Expected Discharge Date 12/14/22      T5 Nustep   Level 1    SPM 75    Minutes 30    METs 1.5      Prescription Details   Frequency (times per week) 3    Duration Progress to 30 minutes of continuous aerobic without signs/symptoms of physical distress      Intensity   THRR 40-80% of Max Heartrate 55-110    Ratings of Perceived Exertion 11-13    Perceived Dyspnea 0-4      Progression   Progression Continue to progress workloads to maintain intensity without signs/symptoms of physical distress.      Resistance Training   Training Prescription Yes    Weight 1 lb    Reps 10-15             Discharge Exercise Prescription (Final Exercise Prescription Changes):  Exercise Prescription Changes - 01/16/23 1645       Response to Exercise   Blood Pressure (Admit) 142/60    Blood Pressure (Exercise) 152/62    Blood Pressure (Exit) 114/70    Heart Rate (Admit) 71 bpm    Heart Rate (Exercise) 124 bpm    Heart Rate (Exit) 80 bpm    Rating of Perceived Exertion (Exercise) 12     Perceived Dyspnea (Exercise) 0    Symptoms 0    Comments Reviewed MET's and goals    Duration Progress to 30 minutes of  aerobic without signs/symptoms of physical distress    Intensity THRR unchanged      Progression   Progression Continue to progress workloads to maintain intensity without signs/symptoms of physical distress.    Average METs 2.3      Resistance Training   Training Prescription No      T5 Nustep   Level 2    SPM 93    Minutes 30    METs 2.3      Home Exercise Plan   Plans to continue exercise at Home (comment)    Frequency Add 2 additional days to program exercise sessions.    Initial Home Exercises Provided 11/07/22             Functional Capacity:  6 Minute Walk     Row Name 10/16/22 1134 01/18/23 1752       6 Minute Walk   Phase Initial Discharge    Distance 1034 feet 1400 feet    Distance % Change -- 35.4 %    Distance Feet Change -- 366 ft    Walk Time 6 minutes  6 minutes    # of Rest Breaks 1  One 24 second standing rest break taken. 1  from ~2.55-3.10 for high heart rate    MPH 1.96 2.65    METS 1.96 2.71    RPE 12 13    Perceived Dyspnea  1 1    VO2 Peak 6.87 9.49    Symptoms Yes (comment) --    Comments Mild shortness of breath. mild SOB    Resting HR 82 bpm 70 bpm    Resting BP 142/84 142/68    Resting Oxygen Saturation  96 % --    Exercise Oxygen Saturation  during 6 min walk 98 % 99 %    Max Ex. HR 118 bpm 132 bpm    Max Ex. BP 168/64 150/68    2 Minute Post BP 148/68 144/68             Psychological, QOL, Others - Outcomes: PHQ 2/9:    01/25/2023    3:41 PM 10/16/2022   11:43 AM  Depression screen PHQ 2/9  Decreased Interest 0 0  Down, Depressed, Hopeless 0 0  PHQ - 2 Score 0 0    Quality of Life:  Quality of Life - 01/18/23 1712       Quality of Life   Select Quality of Life      Quality of Life Scores   Health/Function Post 26.04 %    Socioeconomic Post 24.42 %    Psych/Spiritual Post 27.17 %     Family Post 27 %    GLOBAL Post 26.07 %             Personal Goals: Goals established at orientation with interventions provided to work toward goal.  Personal Goals and Risk Factors at Admission - 10/16/22 1357       Core Components/Risk Factors/Patient Goals on Admission    Weight Management Yes;Obesity    Intervention Weight Management/Obesity: Establish reasonable short term and long term weight goals.;Obesity: Provide education and appropriate resources to help participant work on and attain dietary goals.    Admit Weight 210 lb 12.2 oz (95.6 kg)    Expected Outcomes Short Term: Continue to assess and modify interventions until short term weight is achieved;Long Term: Adherence to nutrition and physical activity/exercise program aimed toward attainment of established weight goal;Weight Loss: Understanding of general recommendations for a balanced deficit meal plan, which promotes 1-2 lb weight loss per week and includes a negative energy balance of [id number] kcal/d    Hypertension Yes    Intervention Provide education on lifestyle modifcations including regular physical activity/exercise, weight management, moderate sodium restriction and increased consumption of fresh fruit, vegetables, and low fat dairy, alcohol moderation, and smoking cessation.;Monitor prescription use compliance.    Expected Outcomes Short Term: Continued assessment and intervention until BP is < 140/49mm HG in hypertensive participants. < 130/58mm HG in hypertensive participants with diabetes, heart failure or chronic kidney disease.;Long Term: Maintenance of blood pressure at goal levels.    Lipids Yes    Intervention Provide education and support for participant on nutrition & aerobic/resistive exercise along with prescribed medications to achieve LDL 70mg , HDL >40mg .    Expected Outcomes Short Term: Participant states understanding of desired cholesterol values and is compliant with medications prescribed.  Participant is following exercise prescription and nutrition guidelines.;Long Term: Cholesterol controlled with medications as prescribed, with individualized exercise RX and with personalized nutrition plan. Value goals: LDL < 70mg , HDL > 40 mg.  Personal Goals Discharge:  Goals and Risk Factor Review     Row Name 10/24/22 1631 11/13/22 1535 12/12/22 1654 01/08/23 1148       Core Components/Risk Factors/Patient Goals Review   Personal Goals Review Weight Management/Obesity;Hypertension;Lipids Weight Management/Obesity;Hypertension;Lipids Weight Management/Obesity;Hypertension;Lipids Weight Management/Obesity;Hypertension;Lipids    Review Traci [name] started intensive cardiac rehab on 10/24/22 and did well with exercise. Vital signs were stable. patient is somewhat deconditioned asked the patient to bring her can with her to use as she uses one at home sometimes Traci [name] is doing well with exercise at intensive cardiac rehab  for her fitness level. Some moderate and exertional exertional BP elevations have been noted at intensive cardiac rehab. Will continue to monitor  BP Traci [name] has been  doing well with exercise at intensive cardiac rehab  for her fitness level. Traci [name] has been out due to a prolasped bladder, urinary tract infection Traci [name] has been out due to a prolasped bladder, urinary tract infection. Traci [name] is scheduled to return to exercise on 01/09/23    Expected Outcomes Traci [name] will continue to participate in intensive cardiac rehab for exercise, nutrition and lifestyle modifications Traci [name] will continue to participate in intensive cardiac rehab for exercise, nutrition and lifestyle modifications Traci [name] will continue to participate in intensive cardiac rehab for exercise, nutrition and lifestyle modifications Traci [name] will continue to participate in intensive cardiac rehab for exercise, nutrition and lifestyle modifications             Exercise Goals and Review:  Exercise Goals     Row  Name 10/16/22 1144             Exercise Goals   Increase Physical Activity Yes       Intervention Provide advice, education, support and counseling about physical activity/exercise needs.;Develop an individualized exercise prescription for aerobic and resistive training based on initial evaluation findings, risk stratification, comorbidities and participant's personal goals.       Expected Outcomes Short Term: Attend rehab on a regular basis to increase amount of physical activity.;Long Term: Exercising regularly at least 3-5 days a week.;Long Term: Add in home exercise to make exercise part of routine and to increase amount of physical activity.       Increase Strength and Stamina Yes       Intervention Provide advice, education, support and counseling about physical activity/exercise needs.;Develop an individualized exercise prescription for aerobic and resistive training based on initial evaluation findings, risk stratification, comorbidities and participant's personal goals.       Expected Outcomes Short Term: Increase workloads from initial exercise prescription for resistance, speed, and METs.;Short Term: Perform resistance training exercises routinely during rehab and add in resistance training at home;Long Term: Improve cardiorespiratory fitness, muscular endurance and strength as measured by increased METs and functional capacity (6MWT)       Able to understand and use rate of perceived exertion (RPE) scale Yes       Intervention Provide education and explanation on how to use RPE scale       Expected Outcomes Short Term: Able to use RPE daily in rehab to express subjective intensity level;Long Term:  Able to use RPE to guide intensity level when exercising independently       Knowledge and understanding of Target Heart Rate Range (THRR) Yes       Intervention Provide education and explanation of THRR including how the numbers were predicted and where they are located for reference        Expected Outcomes Short Term: Able  to state/look up THRR;Long Term: Able to use THRR to govern intensity when exercising independently;Short Term: Able to use daily as guideline for intensity in rehab       Able to check pulse independently Yes       Intervention Provide education and demonstration on how to check pulse in carotid and radial arteries.;Review the importance of being able to check your own pulse for safety during independent exercise       Expected Outcomes Short Term: Able to explain why pulse checking is important during independent exercise;Long Term: Able to check pulse independently and accurately       Understanding of Exercise Prescription Yes       Intervention Provide education, explanation, and written materials on patient's individual exercise prescription       Expected Outcomes Short Term: Able to explain program exercise prescription;Long Term: Able to explain home exercise prescription to exercise independently                Exercise Goals Re-Evaluation:  Exercise Goals Re-Evaluation     Row Name 10/24/22 1616 11/07/22 1647 12/05/22 1636 01/16/23 1649       Exercise Goal Re-Evaluation   Exercise Goals Review Increase Physical Activity;Understanding of Exercise Prescription;Knowledge and understanding of Target Heart Rate Range (THRR);Able to understand and use rate of perceived exertion (RPE) scale;Increase Strength and Stamina Increase Physical Activity;Understanding of Exercise Prescription;Knowledge and understanding of Target Heart Rate Range (THRR);Able to understand and use rate of perceived exertion (RPE) scale;Increase Strength and Stamina Increase Physical Activity;Understanding of Exercise Prescription;Knowledge and understanding of Target Heart Rate Range (THRR);Able to understand and use rate of perceived exertion (RPE) scale;Increase Strength and Stamina Increase Physical Activity;Understanding of Exercise Prescription;Knowledge and understanding of  Target Heart Rate Range (THRR);Able to understand and use rate of perceived exertion (RPE) scale;Increase Strength and Stamina    Comments Pt first day in the [facility] program. Pt tolerated exercise well with an average MET level of 1.8. Pt is learning her THRR, RPE and ExRx. Overall pt off to a great start and feels good. Reviewed MET's, goals and home ExRx. Pt tolerated exercise well with an average MET level of 1.9. Pt is feeling good about her goals and feels like shes on the right track for increasing strength and endurance. Pt will continue to exercise by walking 1-2 days a week by walking for 15-30 mins as tolerated. Pt is interested in joining the [facility] when she graduated but feels like it will be too much right now. Reviewed MET's and goals. Pt tolerated exercise well with an average MET level of 1.7. Pt has been absent, but was archieving 2.1 average MET's before and today achived 1.7. Will work on increasing [place] and increasing strength as she retunrs to exercise. Reviewed MET's and goals. Pt tolerated exercise well with an average MET level of 2.3. Pt has been doing really well since returning to cardiac rehab and has increased her MET's since retunring. Pt states that overall she feels better and is having an easier time doing the things she wants to do and havining the stamina to complete them    Expected Outcomes Will continue to monitor pt and progress workloads as tolerated without sign or symptom Pt will addin walking 1-2 days 15-30 mins as tolerated. Will continue to monitor pt and progress workloads as tolerated without sign or symptom Will continue to monitor pt and progress workloads as tolerated without sign or symptom Will continue to monitor pt and progress workloads  as tolerated without sign or symptom             Nutrition & Weight - Outcomes:  Pre Biometrics - 10/16/22 1015       Pre Biometrics   Waist Circumference 43.5 inches    Hip Circumference 49.75 inches    Waist to  Hip Ratio 0.87 %    Triceps Skinfold 34 mm    % Body Fat 46.4 %    Grip Strength 14 kg    Flexibility --   Not performed due to back stiffness.   Single Leg Stand 7.81 seconds             Post Biometrics - 01/18/23 1754        Post  Biometrics   Height 5\' 7"  (1.702 m)    Weight 90 kg    Waist Circumference 38.25 inches    Hip Circumference 47.5 inches    Waist to Hip Ratio 0.81 %    BMI (Calculated) 31.07    Triceps Skinfold 40 mm    % Body Fat 44.4 %    Grip Strength 22 kg    Single Leg Stand 11 seconds             Nutrition:  Nutrition Therapy & Goals - 01/18/23 1540       Nutrition Therapy   Diet Heart Healthy Diet    Drug/Food Interactions Statins/Certain Fruits      Personal Nutrition Goals   Nutrition Goal Patient to identify strategies for improving cardiovascular risk by attending the weekly [employer] education series    Personal Goal #2 Patient to improve diet quality by using the plate method as a guide for meal planning to include lean protein/plant protein, fruits, vegetables, whole grains, and nonfat dairy as part of a heart healthy diet    Personal Goal #3 Patient to limit to 1500mg  of sodium daily.    Comments Goals in progress. Traci [name] continues to attend the [employer] education and nutrition series regularly. She is down 12.3# since starting with our program. [facility] and continues to work on high fiber foods choices and moderation of saturated fats/added fats.  Traci [name] will continue to benefit adherance to the Pritikin eating plan and exercise long term.      Intervention Plan   Intervention Prescribe, educate and counsel regarding individualized specific dietary modifications aiming towards targeted core components such as weight, hypertension, lipid management, diabetes, heart failure and other comorbidities.;Nutrition handout(s) given to patient.    Expected Outcomes Short Term Goal: Understand basic principles of dietary content,  such as calories, fat, sodium, cholesterol and nutrients.;Long Term Goal: Adherence to prescribed nutrition plan.             Nutrition Discharge:   Education Questionnaire Score:  Knowledge Questionnaire Score - 01/18/23 1712       Knowledge Questionnaire Score   Post Score 24/24             Goals reviewed with patient; copy given to patient.Pt graduates from  Intensive cardiac rehab program on 01/25/23 with completion of  38 exercise and education sessions. Pt maintained good attendance and progressed nicely during their participation in rehab as evidenced by increased MET level. Traci [name] has lost 6.1 kg since starting cardiac rehab.  Medication list reconciled. Repeat  PHQ score- 0 .  Pt has made significant lifestyle changes and should be commended for their success. Traci [name]  achieved their goals during cardiac rehab.  [facility]. We are proud of Traci [name]'s progress!Thayer Headings RN BSN

## 2023-02-01 ENCOUNTER — Ambulatory Visit: Payer: Medicare HMO | Attending: Cardiovascular Disease

## 2023-02-01 DIAGNOSIS — I480 Paroxysmal atrial fibrillation: Secondary | ICD-10-CM | POA: Diagnosis not present

## 2023-02-15 NOTE — Addendum Note (Signed)
Encounter addended by: Cammy Copa, RN on: 02/15/2023 2:33 PM  Actions taken: Clinical Note Signed

## 2023-02-27 DIAGNOSIS — N3946 Mixed incontinence: Secondary | ICD-10-CM | POA: Diagnosis not present

## 2023-02-27 DIAGNOSIS — N130 Hydronephrosis with ureteropelvic junction obstruction: Secondary | ICD-10-CM | POA: Diagnosis not present

## 2023-02-27 DIAGNOSIS — N813 Complete uterovaginal prolapse: Secondary | ICD-10-CM | POA: Diagnosis not present

## 2023-03-01 ENCOUNTER — Telehealth: Payer: Self-pay | Admitting: *Deleted

## 2023-03-01 NOTE — Telephone Encounter (Signed)
   Primary Cardiologist: Thurmon Fair, MD  Chart reviewed as part of pre-operative protocol coverage. Given past medical history and time since last visit, based on ACC/AHA guidelines, TIRZAH FROSS would be at acceptable risk for the planned procedure without further cardiovascular testing.   Patient was advised that if she develops new symptoms prior to surgery to contact our office to arrange a follow-up appointment.  He verbalized understanding.  Ideally aspirin should be continued without interruption, however if the bleeding risk is too great, aspirin may be held for 5-7 days prior to surgery. Please resume aspirin post operatively when it is felt to be safe from a bleeding standpoint.   I will route this recommendation to the requesting party via Epic fax function and remove from pre-op pool.  Please call with questions.  Levi Aland, NP-C  03/01/2023, 12:35 PM [address], [address] Office [phone] Fax [phone]

## 2023-03-01 NOTE — Telephone Encounter (Signed)
[name], request received for clearance for robotic assisted supracervical hysterectomy, mid-urethral sling and sacrocolpopexy.In your note from ov 01/07/23, you mention surgical risk, however you state "If she should decide to have uterine prolapse surgery, I think this cannot be performed with low cardiovascular risk."  Would you please clarify and provide guidance for cardiac risk for upcoming procedure.  Please route your response to p cv div preop.  Thank you, Marcelino Duster

## 2023-03-01 NOTE — Telephone Encounter (Signed)
That is a very unfortunate mis-charting on my part Chemical engineer?). I meant "CAN be performed with low CV risk". Will correct note

## 2023-03-01 NOTE — Telephone Encounter (Signed)
   Pre-operative Risk Assessment    Patient Name: Traci [name]  DOB: August 16, 1941 MRN: [id number]      Request for Surgical Clearance    Procedure:   Robot Assisted Supracervical Hysterectomy BSO; Mid-Urethral Sling &  Sacrocolpopexy.  Date of Surgery:  Clearance 05/08/23                                 Surgeon:  Dr. Berniece Salines Surgeon's Group or Practice Name:  [facility]. Phone number:  [phone] Fax number:  [phone]   Type of Clearance Requested:   - Medical  - Pharmacy:  Hold Aspirin 5 days prior.    Type of Anesthesia:  Not Indicated   Additional requests/questions:    Signed, Emmit Pomfret   03/01/2023, 11:19 AM

## 2023-03-15 DIAGNOSIS — N8111 Cystocele, midline: Secondary | ICD-10-CM | POA: Diagnosis not present

## 2023-03-15 DIAGNOSIS — N813 Complete uterovaginal prolapse: Secondary | ICD-10-CM | POA: Diagnosis not present

## 2023-03-15 DIAGNOSIS — N130 Hydronephrosis with ureteropelvic junction obstruction: Secondary | ICD-10-CM | POA: Diagnosis not present

## 2023-04-05 NOTE — Patient Instructions (Signed)
DUE TO COVID-19 ONLY TWO VISITORS  (aged 83 and older)  ARE ALLOWED TO COME WITH YOU AND STAY IN THE WAITING ROOM ONLY DURING PRE OP AND PROCEDURE.   **NO VISITORS ARE ALLOWED IN THE SHORT STAY AREA OR RECOVERY ROOM!!**  IF YOU WILL BE ADMITTED INTO THE HOSPITAL YOU ARE ALLOWED ONLY FOUR SUPPORT PEOPLE DURING VISITATION HOURS ONLY (7 AM -8PM)   The support person(s) must pass our screening, gel in and out, and wear a mask at all times, including in the patient's room. Patients must also wear a mask when staff or their support person are in the room. Visitors GUEST BADGE MUST BE WORN VISIBLY  One adult visitor may remain with you overnight and MUST be in the room by 8 P.M.     Your procedure is scheduled on: 05/08/23   Report to [facility] Main Entrance    Report to admitting at  6:15 AM   Call this number if you have problems the morning of surgery [phone]  Start a full liquid diet the day before surgery  Light Diet- Full liquid diet  Strained creamy soups Tea, Coffee- with cream or mild and sugar or honey  Juices- cranberry , grape and apple  Jello  Milkshakes  Pudding , custards  Popsicles  Water Plain ice cream f, frozen yogurt, sherbet, plain yogurt  Fruit ices and popsicles with no fruit pulp  Sugar, honey and syrups Clear broths  Boost, Ensure, Resource and other liquid supplements NO CARBONATED BEVERAGES    Start the clear liquid diet at midnight the day of surgery  until _5:30_____ AM/  DAY OF SURGERY  Water Black Coffee (sugar ok, NO MILK/CREAM OR CREAMERS)  Tea (sugar ok, NO MILK/CREAM OR CREAMERS) regular and decaf                             Plain Jell-O (NO RED)                                           Fruit ices (not with fruit pulp, NO RED)                                     Popsicles (NO RED)                                                                  Juice: apple, WHITE grape, WHITE cranberry Sports drinks like Gatorade (NO RED)                                  If you have questions, please contact your surgeon's office.    Oral Hygiene is also important to reduce your risk of infection.                                    Remember - BRUSH YOUR TEETH THE MORNING  OF SURGERY WITH YOUR REGULAR TOOTHPASTE  DENTURES WILL BE REMOVED PRIOR TO SURGERY PLEASE DO NOT APPLY "Poly grip" OR ADHESIVES!!!   Do NOT smoke after Midnight   Before surgery.Stop taking __ASA 81_________on __________as instructed by _____________.  Stop taking ____________as directed by your Surgeon/Cardiologist.  Contact your Surgeon/Cardiologist for instructions on Anticoagulant Therapy prior to surgery.   Take these medicines the morning of surgery with A SIP OF WATER: Atorvastatin, Pantoprazole   Bring CPAP mask and tubing day of surgery.                              You may not have any metal on your body including hair pins, jewelry, and body piercing             Do not wear make-up, lotions, powders, perfumes/cologne, or deodorant  Do not wear nail polish including gel and S&S, artificial/acrylic nails, or any other type of covering on natural nails including finger and toenails. If you have artificial nails, gel coating, etc. that needs to be removed by a nail salon please have this removed prior to surgery or surgery may need to be canceled/ delayed if the surgeon/ anesthesia feels like they are unable to be safely monitored.   Do not shave  48 hours prior to surgery.     Do not bring valuables to the hospital. [facility] IS NOT             RESPONSIBLE   FOR VALUABLES.   Contacts, glasses, or bridgework may not be worn into surgery.   Bring small overnight bag day of surgery.   DO NOT BRING YOUR HOME MEDICATIONS TO THE HOSPITAL. PHARMACY WILL DISPENSE MEDICATIONS LISTED ON YOUR MEDICATION LIST TO YOU DURING YOUR ADMISSION IN THE HOSPITAL!    Patients discharged on the day of surgery will not be allowed to drive home.  Someone NEEDS  to stay with you for the first 24 hours after anesthesia.   Special Instructions: Bring a copy of your healthcare power of attorney and living will documents  the day of surgery if you haven't scanned them before.              Please read over the following fact sheets you were given: IF YOU HAVE QUESTIONS ABOUT YOUR PRE-OP INSTRUCTIONS PLEASE CALL [phone]    [facility] - Preparing for Surgery Before surgery, you can play an important role.  Because skin is not sterile, your skin needs to be as free of germs as possible.  You can reduce the number of germs on your skin by washing with CHG (chlorahexidine gluconate) soap before surgery.  CHG is an antiseptic cleaner which kills germs and bonds with the skin to continue killing germs even after washing. Please DO NOT use if you have an allergy to CHG or antibacterial soaps.  If your skin becomes reddened/irritated stop using the CHG and inform your nurse when you arrive at Short Stay. Do not shave (including legs and underarms) for at least 48 hours prior to the first CHG shower.   Please follow these instructions carefully:  1.  Shower with CHG Soap the night before surgery and the  morning of Surgery.  2.  If you choose to wash your hair, wash your hair first as usual with your  normal  shampoo.  3.  After you shampoo, rinse your hair and body thoroughly to remove the  shampoo.  4.  Use CHG as you would any other liquid soap.  You can apply chg directly  to the skin and wash                       Gently with a scrungie or clean washcloth.  5.  Apply the CHG Soap to your body ONLY FROM THE NECK DOWN.   Do not use on face/ open                           Wound or open sores. Avoid contact with eyes, ears mouth and genitals (private parts).                       Wash face,  Genitals (private parts) with your normal soap.             6.  Wash thoroughly, paying special attention to the area where your surgery  will be  performed.  7.  Thoroughly rinse your body with warm water from the neck down.  8.  DO NOT shower/wash with your normal soap after using and rinsing off  the CHG Soap.             9.  Pat yourself dry with a clean towel.            10.  Wear clean pajamas.            11.  Place clean sheets on your bed the night of your first shower and do not  sleep with pets. Day of Surgery : Do not apply any lotions/deodorants the morning of surgery.  Please wear clean clothes to the hospital/surgery center.  FAILURE TO FOLLOW THESE INSTRUCTIONS MAY RESULT IN THE CANCELLATION OF YOUR SURGERY  PATIENT SIGNATURE_________________________________   ________________________________________________________________________

## 2023-04-10 ENCOUNTER — Encounter (HOSPITAL_COMMUNITY): Payer: Self-pay

## 2023-04-16 ENCOUNTER — Other Ambulatory Visit: Payer: Self-pay | Admitting: Urology

## 2023-04-22 NOTE — Patient Instructions (Addendum)
SURGICAL WAITING ROOM VISITATION Patients having surgery or a procedure may have no more than 2 support people in the waiting area - these visitors may rotate in the visitor waiting room.   Due to an increase in RSV and influenza rates and associated hospitalizations, children ages 73 and under may not visit patients in [facility] hospitals. If the patient needs to stay at the hospital during part of their recovery, the visitor guidelines for inpatient rooms apply.  PRE-OP VISITATION  Pre-op nurse will coordinate an appropriate time for 1 support person to accompany the patient in pre-op.  This support person may not rotate.  This visitor will be contacted when the time is appropriate for the visitor to come back in the pre-op area.  Please refer to the [facility] website for the visitor guidelines for Inpatients (after your surgery is over and you are in a regular room).  You are not required to quarantine at this time prior to your surgery. However, you must do this: Hand Hygiene often Do NOT share personal items Notify your provider if you are in close contact with someone who has COVID or you develop fever 100.4 or greater, new onset of sneezing, cough, sore throat, shortness of breath or body aches.  If you test positive for Covid or have been in contact with anyone that has tested positive in the last 10 days please notify you surgeon.    Your procedure is scheduled on:  Wednesday   May 08, 2023  Report to [facility] Main Entrance: [facility] entrance where the [employer] is available.   Report to admitting at: 05:15    AM  +++++Call this number if you have any questions or problems the morning of surgery [phone]  DO NOT EAT OR DRINK ANYTHING AFTER MIDNIGHT THE NIGHT PRIOR TO YOUR SURGERY / PROCEDURE.   FOLLOW BOWEL PREP AND ANY ADDITIONAL PRE OP INSTRUCTIONS YOU RECEIVED FROM YOUR SURGEON'S OFFICE!!!  COLACE- TAKE 2 TABLETS AT 12:00 PM AND 6:00 PM WITH GATORADE  THE DAY BEFORE SURGERY   MIRALAX-Mix contents of  1/2 the container with 64 ounces Gatorade at 12:00 pm the day before surgery. Drink 1 glass every 15-30 minutes until gone.    Oral Hygiene is also important to reduce your risk of infection.        Remember - BRUSH YOUR TEETH THE MORNING OF SURGERY WITH YOUR REGULAR TOOTHPASTE  Do NOT smoke after Midnight the night before surgery.  Take ONLY these medicines the morning of surgery with A SIP OF WATER: None???                  You may not have any metal on your body including hair pins, jewelry, and body piercing  Do not wear make-up, lotions, powders, perfumes  or deodorant  Do not wear nail polish including gel and S&S, artificial / acrylic nails, or any other type of covering on natural nails including finger and toenails. If you have artificial nails, gel coating, etc., that needs to be removed by a nail salon, Please have this removed prior to surgery. Not doing so may mean that your surgery could be cancelled or delayed if the Surgeon or anesthesia staff feels like they are unable to monitor you safely.   Do not shave 48 hours prior to surgery to avoid nicks in your skin which may contribute to postoperative infections.   Contacts, Hearing Aids, dentures or bridgework may not be worn into surgery. DENTURES  WILL BE REMOVED PRIOR TO SURGERY PLEASE DO NOT APPLY "Poly grip" OR ADHESIVES!!!  You may bring a small overnight bag with you on the day of surgery, only pack items that are not valuable. [facility] IS NOT RESPONSIBLE   FOR VALUABLES THAT ARE LOST OR STOLEN.   Do not bring your home medications to the hospital. The Pharmacy will dispense medications listed on your medication list to you during your admission in the Hospital.  Special Instructions: Bring a copy of your healthcare power of attorney and living will documents the day of surgery, if you wish to have them scanned into your [place] Medical Records- EPIC  Please read  over the following fact sheets you were given: IF YOU HAVE QUESTIONS ABOUT YOUR PRE-OP INSTRUCTIONS, PLEASE CALL [phone].   [place] - Preparing for Surgery Before surgery, you can play an important role.  Because skin is not sterile, your skin needs to be as free of germs as possible.  You can reduce the number of germs on your skin by washing with CHG (chlorahexidine gluconate) soap before surgery.  CHG is an antiseptic cleaner which kills germs and bonds with the skin to continue killing germs even after washing. Please DO NOT use if you have an allergy to CHG or antibacterial soaps.  If your skin becomes reddened/irritated stop using the CHG and inform your nurse when you arrive at Short Stay. Do not shave (including legs and underarms) for at least 48 hours prior to the first CHG shower.  You may shave your face/neck.  Please follow these instructions carefully:  1.  Shower with CHG Soap the night before surgery and the  morning of surgery.  2.  If you choose to wash your hair, wash your hair first as usual with your normal  shampoo.  3.  After you shampoo, rinse your hair and body thoroughly to remove the shampoo.                             4.  Use CHG as you would any other liquid soap.  You can apply chg directly to the skin and wash.  Gently with a scrungie or clean washcloth.  5.  Apply the CHG Soap to your body ONLY FROM THE NECK DOWN.   Do not use on face/ open                           Wound or open sores. Avoid contact with eyes, ears mouth and genitals (private parts).                       Wash face,  Genitals (private parts) with your normal soap.             6.  Wash thoroughly, paying special attention to the area where your  surgery  will be performed.  7.  Thoroughly rinse your body with warm water from the neck down.  8.  DO NOT shower/wash with your normal soap after using and rinsing off the CHG Soap.            9.  Pat yourself dry with a clean towel.             10.  Wear clean pajamas.            11.  Place clean sheets on your bed the night  of your first shower and do not  sleep with pets.  ON THE DAY OF SURGERY : Do not apply any lotions/deodorants the morning of surgery.  Please wear clean clothes to the hospital/surgery center.    FAILURE TO FOLLOW THESE INSTRUCTIONS MAY RESULT IN THE CANCELLATION OF YOUR SURGERY  PATIENT SIGNATURE_________________________________  NURSE SIGNATURE__________________________________  ________________________________________________________________________

## 2023-04-22 NOTE — Progress Notes (Signed)
COVID Vaccine received:  []  No [x]  Yes Date of any COVID positive Test in last 90 days: None  PCP - Geoffry Paradise, MD Cardiologist - Thurmon Fair, MD  Eligha Bridegroom, NP 03-01-2023 Phone note clearance  Chest x-ray - 12-12-2022   2v  Epic EKG -  12-12-2022  Epic Stress Test -  ECHO - 10-10-2022  Epic Cardiac Cath - 06-20-2022  Epic Tele. Monitor-  03-05-2023  Epic  PCR screen: []  Ordered & Completed           [x]   No Order but Needs PROFEND           []   N/A for this surgery  Surgery Plan:  []  Ambulatory                            [x]  Outpatient in bed                            []  Admit  Anesthesia:    [x]  General  []  Spinal                           []   Choice []   MAC  Bowel Prep - []  No  []   Yes ______  Pacemaker / ICD device [x]  No []  Yes   Spinal Cord Stimulator:[x]  No []  Yes       History of Sleep Apnea? [x]  No []  Yes   CPAP used?- [x]  No []  Yes    Does the patient monitor blood sugar?          []  No []  Yes  [x]  N/A  Patient has: [x]  NO Hx DM   []  Pre-DM                 []  DM1  []   DM2  Blood Thinner / Instructions:  none Aspirin Instructions:  ASA 81 mg  Hold x5-7 days per M.Swinyer patient is aware.   ERAS Protocol Ordered: [x]  No  []  Yes Patient is to be NPO after: Midnight prior  Activity level: Patient is able to climb a flight of stairs without difficulty; [x]  No CP  [x]  No SOB.  Patient can perform ADLs without assistance.   Anesthesia review: HTN, s/p TAVR 09-04-2022 by Earney Hamburg, MD,  GERD  Patient denies shortness of breath, fever, cough and chest pain at PAT appointment.  Patient verbalized understanding and agreement to the Pre-Surgical Instructions that were given to them at this PAT appointment. Patient was also educated of the need to review these PAT instructions again prior to her surgery.I reviewed the appropriate phone numbers to call if they have any and questions or concerns.

## 2023-04-23 ENCOUNTER — Encounter (HOSPITAL_COMMUNITY)
Admission: RE | Admit: 2023-04-23 | Discharge: 2023-04-23 | Disposition: A | Discharge status: Home / Self Care | Payer: Medicare HMO | Source: Ambulatory Visit | Attending: Internal Medicine | Admitting: Internal Medicine

## 2023-04-25 ENCOUNTER — Other Ambulatory Visit: Payer: Self-pay

## 2023-04-25 ENCOUNTER — Encounter (HOSPITAL_COMMUNITY)
Admission: RE | Admit: 2023-04-25 | Discharge: 2023-04-25 | Disposition: A | Discharge status: Home / Self Care | Payer: Medicare HMO | Source: Ambulatory Visit | Attending: Urology | Admitting: Urology

## 2023-04-25 ENCOUNTER — Encounter (HOSPITAL_COMMUNITY): Payer: Self-pay

## 2023-04-25 DIAGNOSIS — Z01812 Encounter for preprocedural laboratory examination: Secondary | ICD-10-CM | POA: Insufficient documentation

## 2023-04-25 DIAGNOSIS — N8189 Other female genital prolapse: Secondary | ICD-10-CM | POA: Diagnosis not present

## 2023-04-25 DIAGNOSIS — N393 Stress incontinence (female) (male): Secondary | ICD-10-CM | POA: Diagnosis not present

## 2023-04-25 DIAGNOSIS — Z952 Presence of prosthetic heart valve: Secondary | ICD-10-CM | POA: Insufficient documentation

## 2023-04-25 DIAGNOSIS — I119 Hypertensive heart disease without heart failure: Secondary | ICD-10-CM | POA: Diagnosis not present

## 2023-04-25 DIAGNOSIS — K219 Gastro-esophageal reflux disease without esophagitis: Secondary | ICD-10-CM | POA: Diagnosis not present

## 2023-04-25 HISTORY — DX: Malignant (primary) neoplasm, unspecified: C80.1

## 2023-04-25 HISTORY — DX: Pneumonia, unspecified organism: J18.9

## 2023-04-25 HISTORY — DX: Unspecified osteoarthritis, unspecified site: M19.90

## 2023-04-25 LAB — CBC
HCT: 37.3 % (ref 36.0–46.0)
Hemoglobin: 11.5 g/dL — ABNORMAL LOW (ref 12.0–15.0)
MCH: 28.8 pg (ref 26.0–34.0)
MCHC: 30.8 g/dL (ref 30.0–36.0)
MCV: 93.3 fL (ref 80.0–100.0)
Platelets: 217 10*3/uL (ref 150–400)
RBC: 4 MIL/uL (ref 3.87–5.11)
RDW: 13 % (ref 11.5–15.5)
WBC: 6.5 10*3/uL (ref 4.0–10.5)
nRBC: 0 % (ref 0.0–0.2)

## 2023-04-25 LAB — COMPREHENSIVE METABOLIC PANEL
ALT: 13 U/L (ref 0–44)
AST: 16 U/L (ref 15–41)
Albumin: 3.7 g/dL (ref 3.5–5.0)
Alkaline Phosphatase: 58 U/L (ref 38–126)
Anion gap: 7 (ref 5–15)
BUN: 20 mg/dL (ref 8–23)
CO2: 26 mmol/L (ref 22–32)
Calcium: 9 mg/dL (ref 8.9–10.3)
Chloride: 107 mmol/L (ref 98–111)
Creatinine, Ser: 0.92 mg/dL (ref 0.44–1.00)
GFR, Estimated: >60 mL/min (ref >60)
Glucose, Bld: 112 mg/dL — ABNORMAL HIGH (ref 70–99)
Potassium: 4 mmol/L (ref 3.5–5.1)
Sodium: 140 mmol/L (ref 135–145)
Total Bilirubin: 0.5 mg/dL (ref 0.3–1.2)
Total Protein: 6.8 g/dL (ref 6.5–8.1)

## 2023-04-25 LAB — TYPE AND SCREEN
ABO/RH(D): A POS
Antibody Screen: NEGATIVE

## 2023-04-26 LAB — URINE CULTURE: Culture: NO GROWTH

## 2023-04-29 NOTE — Progress Notes (Signed)
Anesthesia Chart Review   Case: [id number] Date/Time: 05/08/23 0705   Procedures:      XI ROBOTIC ASSISTED LAPAROSCOPIC SUPRACERVICAL  HYSTERECTOMY AND BILATERAL SALPINGO-OOPHORECTOMY, MID-URETHRAL SLING (Bilateral) - 240 MINUTES     XI ROBOTIC ASSISTED LAPAROSCOPIC SACROCOLPOPEXY     CYSTOSCOPY with FIREFLY INJECTION   Anesthesia type: General   Pre-op diagnosis:      PELVIC ORGAN PROLAPSE     STRESS URINARY INCONTINENCE   Location: [facility] ROOM 03 / [facility]   Surgeons: Crist Fat, MD       DISCUSSION:82 y.o. never smoker with h/o HTN, GERD, s/p TAVR 08/2022, pelvic organ prolapse, stress urinary incontinence scheduled for above procedure 05/08/2023 with Dr. Berniece Salines.   Per cardiology preoperative evaluation 03/01/2023, "Chart reviewed as part of pre-operative protocol coverage. Given past medical history and time since last visit, based on ACC/AHA guidelines, DARIELIS HUDLOW would be at acceptable risk for the planned procedure without further cardiovascular testing.    Patient was advised that if she develops new symptoms prior to surgery to contact our office to arrange a follow-up appointment.  He verbalized understanding.   Ideally aspirin should be continued without interruption, however if the bleeding risk is too great, aspirin may be held for 5-7 days prior to surgery. Please resume aspirin post operatively when it is felt to be safe from a bleeding standpoint. "  Anticipate pt can proceed with planned procedure barring acute status change.   VS: BP (!) 148/57 Comment: right arm sitting  Pulse 75   Temp 36.5 C (Oral)   Resp 20   Ht 5\' 7"  (1.702 m)   Wt 88.9 kg   SpO2 98%   BMI 30.70 kg/m   PROVIDERS: Geoffry Paradise, MD is PCP   Cardiologist - Thurmon Fair, MD  LABS: Labs reviewed: Acceptable for surgery. (all labs ordered are listed, but only abnormal results are displayed)  Labs Reviewed  CBC - Abnormal; Notable for the following components:       Result Value   Hemoglobin 11.5 (*)    All other components within normal limits  COMPREHENSIVE METABOLIC PANEL - Abnormal; Notable for the following components:   Glucose, Bld 112 (*)    All other components within normal limits  URINE CULTURE  TYPE AND SCREEN     IMAGES:   EKG:   CV: Echo 10/10/2022 1. Cystic liver dx noted on sub costal images.   2. Left ventricular ejection fraction, by estimation, is 60 to 65%. The  left ventricle has normal function. The left ventricle has no regional  wall motion abnormalities. There is mild left ventricular hypertrophy.  Left ventricular diastolic parameters  were normal. The average left ventricular global longitudinal strain is  -22.1 %. The global longitudinal strain is normal.   3. Right ventricular systolic function is normal. The right ventricular  size is normal. There is normal pulmonary artery systolic pressure.   4. The mitral valve is abnormal. Trivial mitral valve regurgitation. No  evidence of mitral stenosis.   5. Post AVR with 26 mm Sapien 3 valve since post implant TTE 10/25 new  mild PVL noted at 1:00 on short axis images gradients stable . The aortic  valve has been repaired/replaced. Aortic valve regurgitation is not  visualized. No aortic stenosis is  present.   6. The inferior vena cava is normal in size with greater than 50%  respiratory variability, suggesting right atrial pressure of 3 mmHg.  Past Medical History:  Diagnosis  Date   Arthritis    Cancer (HCC)    basal cell x 2 on face.   GERD (gastroesophageal reflux disease)    High cholesterol    Hypertension    Irregular heart rate    Pneumonia    S/P TAVR (transcatheter aortic valve replacement) 09/04/2022   s/p TAVR with a 26mm Edwards S3UR via the TF approach by Dr. Excell Seltzer & Dr. Leafy Ro    Past Surgical History:  Procedure Laterality Date   APPLICATION OF WOUND VAC Right 09/04/2022   Procedure: APPLICATION OF WOUND VAC;  Surgeon: Chuck Hint, MD;  Location: [facility] OR;  Service: Vascular;  Laterality: Right;   CARDIAC CATHETERIZATION     CHOLECYSTECTOMY     DILATION AND CURETTAGE OF UTERUS     ENDARTERECTOMY FEMORAL Right 09/04/2022   Procedure: ENDARTERECTOMY FEMORAL;  Surgeon: Chuck Hint, MD;  Location: [facility] OR;  Service: Vascular;  Laterality: Right;   EYE SURGERY Bilateral    cataract extraction   GROIN DISSECTION Right 09/04/2022   Procedure: GROIN EXPLORATION;  Surgeon: Chuck Hint, MD;  Location: [facility] OR;  Service: Vascular;  Laterality: Right;   INTRAOPERATIVE TRANSTHORACIC ECHOCARDIOGRAM N/A 09/04/2022   Procedure: INTRAOPERATIVE TRANSTHORACIC ECHOCARDIOGRAM;  Surgeon: Tonny Bollman, MD;  Location: [facility] OR;  Service: Open Heart Surgery;  Laterality: N/A;   PATCH ANGIOPLASTY Right 09/04/2022   Procedure: BOVINE PATCH ANGIOPLASTY;  Surgeon: Chuck Hint, MD;  Location: [facility] OR;  Service: Vascular;  Laterality: Right;   RIGHT/LEFT HEART CATH AND CORONARY ANGIOGRAPHY N/A 06/20/2022   Procedure: RIGHT/LEFT HEART CATH AND CORONARY ANGIOGRAPHY;  Surgeon: Kathleene Hazel, MD;  Location: [facility] INVASIVE CV LAB;  Service: Cardiovascular;  Laterality: N/A;   TRANSCATHETER AORTIC VALVE REPLACEMENT, TRANSFEMORAL N/A 09/04/2022   Procedure: Transcatheter Aortic Valve Replacement, Transfemoral;  Surgeon: Tonny Bollman, MD;  Location: [facility] OR;  Service: Open Heart Surgery;  Laterality: N/A;   TUBAL LIGATION     ULTRASOUND GUIDANCE FOR VASCULAR ACCESS Bilateral 09/04/2022   Procedure: ULTRASOUND GUIDANCE FOR VASCULAR ACCESS;  Surgeon: Tonny Bollman, MD;  Location: [facility] OR;  Service: Open Heart Surgery;  Laterality: Bilateral;    MEDICATIONS:  amoxicillin (AMOXIL) 500 MG tablet   Artificial Tear Solution (SOOTHE XP) SOLN   aspirin EC 81 MG tablet   atorvastatin (LIPITOR) 20 MG tablet   cholecalciferol (VITAMIN D) 1000 UNITS tablet   estradiol (ESTRACE) 0.1 MG/GM vaginal cream   lisinopril  (ZESTRIL) 20 MG tablet   pantoprazole (PROTONIX) 40 MG tablet   No current facility-administered medications for this encounter.    Jodell Cipro Ward, PA-C [facility] Pre-Surgical Testing [phone]

## 2023-04-29 NOTE — Anesthesia Preprocedure Evaluation (Addendum)
Anesthesia Evaluation  Patient identified by MRN, date of birth, ID band Patient awake    Reviewed: Allergy & Precautions, NPO status , Patient's Chart, lab work & pertinent test results  Airway Mallampati: II  TM Distance: >3 FB Neck ROM: Full    Dental  (+) Teeth Intact, Caps, Dental Advisory Given   Pulmonary neg pulmonary ROS   Pulmonary exam normal breath sounds clear to auscultation       Cardiovascular hypertension, Pt. on medications Normal cardiovascular exam+ Valvular Problems/Murmurs (s/p TAVR) AS  Rhythm:Regular Rate:Normal  TTE 2023 1. Cystic liver dx noted on sub costal images.   2. Left ventricular ejection fraction, by estimation, is 60 to 65%. The  left ventricle has normal function. The left ventricle has no regional  wall motion abnormalities. There is mild left ventricular hypertrophy.  Left ventricular diastolic parameters  were normal. The average left ventricular global longitudinal strain is  -22.1 %. The global longitudinal strain is normal.   3. Right ventricular systolic function is normal. The right ventricular  size is normal. There is normal pulmonary artery systolic pressure.   4. The mitral valve is abnormal. Trivial mitral valve regurgitation. No  evidence of mitral stenosis.   5. Post AVR with 26 mm Sapien 3 valve since post implant TTE 10/25 new  mild PVL noted at 1:00 on short axis images gradients stable . The aortic  valve has been repaired/replaced. Aortic valve regurgitation is not  visualized. No aortic stenosis is  present.   6. The inferior vena cava is normal in size with greater than 50%  respiratory variability, suggesting right atrial pressure of 3 mmHg.     Neuro/Psych negative neurological ROS  negative psych ROS   GI/Hepatic Neg liver ROS,GERD  ,,  Endo/Other  negative endocrine ROS    Renal/GU negative Renal ROS  negative genitourinary   Musculoskeletal  (+)  Arthritis ,    Abdominal   Peds  Hematology negative hematology ROS (+)   Anesthesia Other Findings   Reproductive/Obstetrics                             Anesthesia Physical Anesthesia Plan  ASA: 3  Anesthesia Plan: General   Post-op Pain Management: Tylenol PO (pre-op)*   Induction: Intravenous  PONV Risk Score and Plan: 3 and Dexamethasone, Ondansetron and Treatment may vary due to age or medical condition  Airway Management Planned: Oral ETT  Additional Equipment:   Intra-op Plan:   Post-operative Plan: Extubation in OR  Informed Consent: I have reviewed the patients History and Physical, chart, labs and discussed the procedure including the risks, benefits and alternatives for the proposed anesthesia with the patient or authorized representative who has indicated his/her understanding and acceptance.     Dental advisory given  Plan Discussed with: CRNA  Anesthesia Plan Comments: (2 IVs)       Anesthesia Quick Evaluation

## 2023-05-08 ENCOUNTER — Observation Stay (HOSPITAL_COMMUNITY)
Admission: RE | Admit: 2023-05-08 | Discharge: 2023-05-10 | Disposition: A | Discharge status: Home / Self Care | Payer: Medicare HMO | Source: Ambulatory Visit | Attending: Urology | Admitting: Urology

## 2023-05-08 ENCOUNTER — Other Ambulatory Visit: Payer: Self-pay

## 2023-05-08 ENCOUNTER — Ambulatory Visit (HOSPITAL_BASED_OUTPATIENT_CLINIC_OR_DEPARTMENT_OTHER): Payer: Medicare HMO | Admitting: Anesthesiology

## 2023-05-08 ENCOUNTER — Ambulatory Visit (HOSPITAL_COMMUNITY): Payer: Medicare HMO | Admitting: Physician Assistant

## 2023-05-08 ENCOUNTER — Encounter (HOSPITAL_COMMUNITY)
Admission: RE | Disposition: A | Discharge status: Home / Self Care | Payer: Self-pay | Source: Ambulatory Visit | Attending: Urology

## 2023-05-08 ENCOUNTER — Encounter (HOSPITAL_COMMUNITY): Payer: Self-pay | Admitting: Urology

## 2023-05-08 DIAGNOSIS — D250 Submucous leiomyoma of uterus: Secondary | ICD-10-CM | POA: Diagnosis not present

## 2023-05-08 DIAGNOSIS — N814 Uterovaginal prolapse, unspecified: Secondary | ICD-10-CM | POA: Diagnosis present

## 2023-05-08 DIAGNOSIS — N813 Complete uterovaginal prolapse: Secondary | ICD-10-CM | POA: Diagnosis not present

## 2023-05-08 DIAGNOSIS — N819 Female genital prolapse, unspecified: Secondary | ICD-10-CM | POA: Diagnosis not present

## 2023-05-08 DIAGNOSIS — I119 Hypertensive heart disease without heart failure: Secondary | ICD-10-CM

## 2023-05-08 DIAGNOSIS — I10 Essential (primary) hypertension: Secondary | ICD-10-CM

## 2023-05-08 DIAGNOSIS — N1330 Unspecified hydronephrosis: Secondary | ICD-10-CM | POA: Diagnosis not present

## 2023-05-08 DIAGNOSIS — E785 Hyperlipidemia, unspecified: Secondary | ICD-10-CM | POA: Diagnosis not present

## 2023-05-08 DIAGNOSIS — Z79899 Other long term (current) drug therapy: Secondary | ICD-10-CM | POA: Diagnosis not present

## 2023-05-08 DIAGNOSIS — Z87891 Personal history of nicotine dependence: Secondary | ICD-10-CM | POA: Diagnosis not present

## 2023-05-08 DIAGNOSIS — N393 Stress incontinence (female) (male): Secondary | ICD-10-CM | POA: Diagnosis not present

## 2023-05-08 DIAGNOSIS — N840 Polyp of corpus uteri: Secondary | ICD-10-CM | POA: Diagnosis not present

## 2023-05-08 DIAGNOSIS — N8111 Cystocele, midline: Secondary | ICD-10-CM | POA: Insufficient documentation

## 2023-05-08 DIAGNOSIS — D251 Intramural leiomyoma of uterus: Secondary | ICD-10-CM | POA: Diagnosis not present

## 2023-05-08 DIAGNOSIS — N8189 Other female genital prolapse: Secondary | ICD-10-CM

## 2023-05-08 DIAGNOSIS — N8003 Adenomyosis of the uterus: Secondary | ICD-10-CM | POA: Diagnosis not present

## 2023-05-08 DIAGNOSIS — N858 Other specified noninflammatory disorders of uterus: Secondary | ICD-10-CM | POA: Diagnosis not present

## 2023-05-08 HISTORY — PX: ROBOTIC ASSISTED LAPAROSCOPIC HYSTERECTOMY AND SALPINGECTOMY: SHX6379

## 2023-05-08 HISTORY — PX: ROBOTIC ASSISTED LAPAROSCOPIC SACROCOLPOPEXY: SHX5388

## 2023-05-08 LAB — HEMOGLOBIN AND HEMATOCRIT, BLOOD
HCT: 33.4 % — ABNORMAL LOW (ref 36.0–46.0)
Hemoglobin: 10.4 g/dL — ABNORMAL LOW (ref 12.0–15.0)

## 2023-05-08 SURGERY — XI ROBOTIC ASSISTED LAPAROSCOPIC HYSTERECTOMY AND SALPINGECTOMY
Anesthesia: General

## 2023-05-08 MED ORDER — ONDANSETRON HCL 4 MG/2ML IJ SOLN
INTRAMUSCULAR | Status: AC
  Filled 2023-05-08: qty 2

## 2023-05-08 MED ORDER — BUPIVACAINE HCL (PF) 0.5 % IJ SOLN
INTRAMUSCULAR | Status: AC
  Filled 2023-05-08: qty 30

## 2023-05-08 MED ORDER — DOCUSATE SODIUM 100 MG PO CAPS: 100.0000 mg | ORAL_CAPSULE | Freq: Two times a day (BID) | ORAL | Status: DC

## 2023-05-08 MED ORDER — LISINOPRIL 20 MG PO TABS
20.0000 mg | ORAL_TABLET | Freq: Every day | ORAL | Status: DC
  Administered 2023-05-08: 20 mg via ORAL
  Filled 2023-05-08: qty 1

## 2023-05-08 MED ORDER — LACTATED RINGERS IV SOLN: INTRAVENOUS | Status: DC | PRN

## 2023-05-08 MED ORDER — DEXAMETHASONE SODIUM PHOSPHATE 10 MG/ML IJ SOLN
INTRAMUSCULAR | Status: DC | PRN
  Administered 2023-05-08: 4 mg via INTRAVENOUS

## 2023-05-08 MED ORDER — LIDOCAINE HCL (PF) 2 % IJ SOLN
INTRAMUSCULAR | Status: AC
  Filled 2023-05-08: qty 5

## 2023-05-08 MED ORDER — ESTRADIOL 0.1 MG/GM VA CREA
TOPICAL_CREAM | VAGINAL | Status: AC
  Filled 2023-05-08: qty 42.5

## 2023-05-08 MED ORDER — CEFAZOLIN SODIUM-DEXTROSE 2-4 GM/100ML-% IV SOLN
2.0000 g | INTRAVENOUS | Status: AC
  Administered 2023-05-08: 2 g via INTRAVENOUS
  Filled 2023-05-08: qty 100

## 2023-05-08 MED ORDER — HYDROMORPHONE HCL 2 MG/ML IJ SOLN
INTRAMUSCULAR | Status: AC
  Filled 2023-05-08: qty 1

## 2023-05-08 MED ORDER — PANTOPRAZOLE SODIUM 40 MG PO TBEC
40.0000 mg | DELAYED_RELEASE_TABLET | Freq: Every evening | ORAL | Status: DC
  Administered 2023-05-08 – 2023-05-09 (×2): 40 mg via ORAL
  Filled 2023-05-08 (×2): qty 1

## 2023-05-08 MED ORDER — ACETAMINOPHEN 500 MG PO TABS
1000.0000 mg | ORAL_TABLET | Freq: Once | ORAL | Status: AC
  Administered 2023-05-08: 1000 mg via ORAL
  Filled 2023-05-08: qty 2

## 2023-05-08 MED ORDER — EPHEDRINE 5 MG/ML INJ
INTRAVENOUS | Status: AC
  Filled 2023-05-08: qty 5

## 2023-05-08 MED ORDER — ACETAMINOPHEN 10 MG/ML IV SOLN
1000.0000 mg | Freq: Four times a day (QID) | INTRAVENOUS | Status: AC
  Administered 2023-05-08 – 2023-05-09 (×3): 1000 mg via INTRAVENOUS
  Filled 2023-05-08 (×3): qty 100

## 2023-05-08 MED ORDER — LIDOCAINE 2% (20 MG/ML) 5 ML SYRINGE
INTRAMUSCULAR | Status: DC | PRN
  Administered 2023-05-08: 60 mg via INTRAVENOUS

## 2023-05-08 MED ORDER — FENTANYL CITRATE (PF) 100 MCG/2ML IJ SOLN
INTRAMUSCULAR | Status: AC
  Filled 2023-05-08: qty 2

## 2023-05-08 MED ORDER — SODIUM CHLORIDE 0.45 % IV SOLN: INTRAVENOUS | Status: DC

## 2023-05-08 MED ORDER — DOCUSATE SODIUM 100 MG PO CAPS
100.0000 mg | ORAL_CAPSULE | Freq: Two times a day (BID) | ORAL | Status: DC
  Administered 2023-05-08 – 2023-05-10 (×4): 100 mg via ORAL
  Filled 2023-05-08 (×4): qty 1

## 2023-05-08 MED ORDER — CHLORHEXIDINE GLUCONATE 0.12 % MT SOLN
15.0000 mL | Freq: Once | OROMUCOSAL | Status: AC
  Administered 2023-05-08: 15 mL via OROMUCOSAL

## 2023-05-08 MED ORDER — SODIUM CHLORIDE (PF) 0.9 % IJ SOLN
INTRAMUSCULAR | Status: DC | PRN
  Administered 2023-05-08: 10 mL

## 2023-05-08 MED ORDER — LACTATED RINGERS IR SOLN
Status: DC | PRN
  Administered 2023-05-08: 1000 mL

## 2023-05-08 MED ORDER — BUPIVACAINE LIPOSOME 1.3 % IJ SUSP
INTRAMUSCULAR | Status: DC | PRN
  Administered 2023-05-08: 20 mL

## 2023-05-08 MED ORDER — PHENYLEPHRINE 80 MCG/ML (10ML) SYRINGE FOR IV PUSH (FOR BLOOD PRESSURE SUPPORT)
PREFILLED_SYRINGE | INTRAVENOUS | Status: DC | PRN
  Administered 2023-05-08 (×4): 80 ug via INTRAVENOUS

## 2023-05-08 MED ORDER — TRAMADOL HCL 50 MG PO TABS: 50.0000 mg | ORAL_TABLET | Freq: Four times a day (QID) | ORAL | Status: DC | PRN

## 2023-05-08 MED ORDER — ROCURONIUM BROMIDE 10 MG/ML (PF) SYRINGE
PREFILLED_SYRINGE | INTRAVENOUS | Status: AC
  Filled 2023-05-08: qty 10

## 2023-05-08 MED ORDER — DIPHENHYDRAMINE HCL 12.5 MG/5ML PO ELIX: 12.5000 mg | ORAL_SOLUTION | Freq: Four times a day (QID) | ORAL | Status: DC | PRN

## 2023-05-08 MED ORDER — DEXAMETHASONE SODIUM PHOSPHATE 10 MG/ML IJ SOLN
INTRAMUSCULAR | Status: AC
  Filled 2023-05-08: qty 1

## 2023-05-08 MED ORDER — SUGAMMADEX SODIUM 200 MG/2ML IV SOLN
INTRAVENOUS | Status: DC | PRN
  Administered 2023-05-08: 200 mg via INTRAVENOUS

## 2023-05-08 MED ORDER — HYDROMORPHONE HCL 1 MG/ML IJ SOLN: 0.5000 mg | INTRAMUSCULAR | Status: DC | PRN

## 2023-05-08 MED ORDER — SODIUM CHLORIDE 0.9 % IR SOLN
Status: DC | PRN
  Administered 2023-05-08: 3000 mL via INTRAVESICAL

## 2023-05-08 MED ORDER — PROPOFOL 10 MG/ML IV BOLUS
INTRAVENOUS | Status: AC
  Filled 2023-05-08: qty 20

## 2023-05-08 MED ORDER — POLYETHYLENE GLYCOL 3350 17 GM/SCOOP PO POWD: 1.0000 | Freq: Once | ORAL | Status: DC

## 2023-05-08 MED ORDER — EPHEDRINE SULFATE-NACL 50-0.9 MG/10ML-% IV SOSY
PREFILLED_SYRINGE | INTRAVENOUS | Status: DC | PRN
  Administered 2023-05-08 (×2): 10 mg via INTRAVENOUS

## 2023-05-08 MED ORDER — FENTANYL CITRATE (PF) 100 MCG/2ML IJ SOLN
INTRAMUSCULAR | Status: DC | PRN
  Administered 2023-05-08 (×4): 50 ug via INTRAVENOUS

## 2023-05-08 MED ORDER — HYOSCYAMINE SULFATE 0.125 MG SL SUBL: 0.1250 mg | SUBLINGUAL_TABLET | SUBLINGUAL | Status: DC | PRN

## 2023-05-08 MED ORDER — HYDROMORPHONE HCL 1 MG/ML IJ SOLN
INTRAMUSCULAR | Status: DC | PRN
  Administered 2023-05-08 (×2): .2 mg via INTRAVENOUS

## 2023-05-08 MED ORDER — ONDANSETRON HCL 4 MG/2ML IJ SOLN
INTRAMUSCULAR | Status: DC | PRN
  Administered 2023-05-08: 4 mg via INTRAVENOUS

## 2023-05-08 MED ORDER — FENTANYL CITRATE PF 50 MCG/ML IJ SOSY: 25.0000 ug | PREFILLED_SYRINGE | INTRAMUSCULAR | Status: DC | PRN

## 2023-05-08 MED ORDER — SULFAMETHOXAZOLE-TRIMETHOPRIM 800-160 MG PO TABS: 1.0000 | ORAL_TABLET | Freq: Two times a day (BID) | ORAL | 0 refills | Status: DC

## 2023-05-08 MED ORDER — ONDANSETRON HCL 4 MG/2ML IJ SOLN: 4.0000 mg | INTRAMUSCULAR | Status: DC | PRN

## 2023-05-08 MED ORDER — ROCURONIUM BROMIDE 10 MG/ML (PF) SYRINGE
PREFILLED_SYRINGE | INTRAVENOUS | Status: DC | PRN
  Administered 2023-05-08: 100 mg via INTRAVENOUS
  Administered 2023-05-08 (×2): 10 mg via INTRAVENOUS

## 2023-05-08 MED ORDER — TRAMADOL HCL 50 MG PO TABS: 50.0000 mg | ORAL_TABLET | Freq: Four times a day (QID) | ORAL | 0 refills | Status: DC | PRN

## 2023-05-08 MED ORDER — PHENYLEPHRINE 80 MCG/ML (10ML) SYRINGE FOR IV PUSH (FOR BLOOD PRESSURE SUPPORT)
PREFILLED_SYRINGE | INTRAVENOUS | Status: AC
  Filled 2023-05-08: qty 10

## 2023-05-08 MED ORDER — BUPIVACAINE HCL (PF) 0.5 % IJ SOLN
INTRAMUSCULAR | Status: DC | PRN
  Administered 2023-05-08: 17 mL
  Administered 2023-05-08: 13 mL

## 2023-05-08 MED ORDER — DIPHENHYDRAMINE HCL 50 MG/ML IJ SOLN: 12.5000 mg | Freq: Four times a day (QID) | INTRAMUSCULAR | Status: DC | PRN

## 2023-05-08 MED ORDER — LACTATED RINGERS IV SOLN: INTRAVENOUS | Status: DC

## 2023-05-08 MED ORDER — ORAL CARE MOUTH RINSE: 15.0000 mL | Freq: Once | OROMUCOSAL | Status: AC

## 2023-05-08 MED ORDER — STERILE WATER FOR IRRIGATION IR SOLN
Status: DC | PRN
  Administered 2023-05-08: 1000 mL
  Administered 2023-05-08: 3000 mL via INTRAVESICAL

## 2023-05-08 MED ORDER — PROPOFOL 10 MG/ML IV BOLUS
INTRAVENOUS | Status: DC | PRN
  Administered 2023-05-08: 100 mg via INTRAVENOUS

## 2023-05-08 MED ORDER — SODIUM CHLORIDE (PF) 0.9 % IJ SOLN
INTRAMUSCULAR | Status: AC
  Filled 2023-05-08: qty 10

## 2023-05-08 MED ORDER — ATORVASTATIN CALCIUM 20 MG PO TABS
20.0000 mg | ORAL_TABLET | Freq: Every day | ORAL | Status: DC
  Administered 2023-05-08 – 2023-05-09 (×2): 20 mg via ORAL
  Filled 2023-05-08 (×2): qty 1

## 2023-05-08 SURGICAL SUPPLY — 97 items
ADAPTER GOLDBERG URETERAL (ADAPTER) IMPLANT
ADH SKN CLS APL DERMABOND .7 (GAUZE/BANDAGES/DRESSINGS) ×2
ADPR CATH 15X14FR FL DRN BG (ADAPTER)
APL PRP STRL LF DISP 70% ISPRP (MISCELLANEOUS) ×2
BAG COUNTER SPONGE SURGICOUNT (BAG) IMPLANT
BAG DRN RND TRDRP ANRFLXCHMBR (UROLOGICAL SUPPLIES)
BAG SPNG CNTER NS LX DISP (BAG)
BAG URINE DRAIN 2000ML AR STRL (UROLOGICAL SUPPLIES) IMPLANT
BAG URO CATCHER STRL LF (MISCELLANEOUS) ×2 IMPLANT
BRIEF MESH DISP LRG (UNDERPADS AND DIAPERS) IMPLANT
CATH FOLEY 2WAY SLVR 5CC 16FR (CATHETERS) ×2 IMPLANT
CATH URETL OPEN 5X70 (CATHETERS) IMPLANT
CHLORAPREP W/TINT 26 (MISCELLANEOUS) ×2 IMPLANT
CLIP LIGATING HEM O LOK PURPLE (MISCELLANEOUS) ×2 IMPLANT
CLIP LIGATING HEMO LOK XL GOLD (MISCELLANEOUS) IMPLANT
CLIP LIGATING HEMO O LOK GREEN (MISCELLANEOUS) IMPLANT
CLIP SUT LAPRA TY ABSORB (SUTURE) IMPLANT
CLOTH BEACON ORANGE TIMEOUT ST (SAFETY) ×2 IMPLANT
COVER BACK TABLE 60X90IN (DRAPES) ×2 IMPLANT
COVER SURGICAL LIGHT HANDLE (MISCELLANEOUS) ×2 IMPLANT
COVER TIP SHEARS 8 DVNC (MISCELLANEOUS) ×2 IMPLANT
DERMABOND ADVANCED .7 DNX12 (GAUZE/BANDAGES/DRESSINGS) ×2 IMPLANT
DRAIN CHANNEL RND F F (WOUND CARE) IMPLANT
DRAPE ARM DVNC X/XI (DISPOSABLE) ×8 IMPLANT
DRAPE COLUMN DVNC XI (DISPOSABLE) ×2 IMPLANT
DRAPE INCISE IOBAN 66X45 STRL (DRAPES) ×2 IMPLANT
DRAPE SHEET LG 3/4 BI-LAMINATE (DRAPES) ×4 IMPLANT
DRAPE SURG IRRIG POUCH 19X23 (DRAPES) ×2 IMPLANT
DRIVER NDL LRG 8 DVNC XI (INSTRUMENTS) ×4 IMPLANT
DRIVER NDLE LRG 8 DVNC XI (INSTRUMENTS) ×4 IMPLANT
ELECT PENCIL ROCKER SW 15FT (MISCELLANEOUS) ×2 IMPLANT
ELECT REM PT RETURN 15FT ADLT (MISCELLANEOUS) ×2 IMPLANT
FORCEPS BPLR LNG DVNC XI (INSTRUMENTS) ×2 IMPLANT
FORCEPS PROGRASP DVNC XI (FORCEP) ×2 IMPLANT
GAUZE 4X4 16PLY ~~LOC~~+RFID DBL (SPONGE) IMPLANT
GLOVE BIO SURGEON STRL SZ 6.5 (GLOVE) ×2 IMPLANT
GLOVE SURG LX STRL 7.5 STRW (GLOVE) ×6 IMPLANT
GOWN SRG XL LVL 4 BRTHBL STRL (GOWNS) ×2 IMPLANT
GOWN STRL NON-REIN XL LVL4 (GOWNS) ×4
GOWN STRL REUS W/ TWL XL LVL3 (GOWN DISPOSABLE) ×4 IMPLANT
GOWN STRL REUS W/TWL XL LVL3 (GOWN DISPOSABLE) ×8
GUIDEWIRE ANG ZIPWIRE 038X150 (WIRE) IMPLANT
GUIDEWIRE STR DUAL SENSOR (WIRE) IMPLANT
HOLDER FOLEY CATH W/STRAP (MISCELLANEOUS) ×2 IMPLANT
IRRIG SUCT STRYKERFLOW 2 WTIP (MISCELLANEOUS) ×2
IRRIGATION SUCT STRKRFLW 2 WTP (MISCELLANEOUS) ×2 IMPLANT
IV LACTATED RINGERS 1000ML (IV SOLUTION) IMPLANT
KIT BASIN OR (CUSTOM PROCEDURE TRAY) ×2 IMPLANT
KIT PROCEDURE DVNC SI (MISCELLANEOUS) ×2 IMPLANT
KIT TURNOVER KIT A (KITS) IMPLANT
MANIFOLD NEPTUNE II (INSTRUMENTS) ×2 IMPLANT
MANIPULATOR UTERINE 4.5 ZUMI (MISCELLANEOUS) IMPLANT
MARKER SKIN DUAL TIP RULER LAB (MISCELLANEOUS) ×2 IMPLANT
MESH Y UPSYLON VAGINAL (Mesh General) IMPLANT
OCCLUDER COLPOPNEUMO (BALLOONS) IMPLANT
PACK CYSTO (CUSTOM PROCEDURE TRAY) ×2 IMPLANT
PACKING VAGINAL (PACKING) IMPLANT
PAD OB MATERNITY 4.3X12.25 (PERSONAL CARE ITEMS) IMPLANT
PAD POSITIONING PINK XL (MISCELLANEOUS) ×2 IMPLANT
RETRACTOR LONRSTAR 16.6X16.6CM (MISCELLANEOUS) IMPLANT
RETRACTOR STAY HOOK 5MM (MISCELLANEOUS) IMPLANT
RETRACTOR STER APS 16.6X16.6CM (MISCELLANEOUS) ×2
SCISSORS MNPLR CVD DVNC XI (INSTRUMENTS) ×2 IMPLANT
SEAL UNIV 5-12 XI (MISCELLANEOUS) ×8 IMPLANT
SET IRRIG Y TYPE TUR BLADDER L (SET/KITS/TRAYS/PACK) IMPLANT
SET TUBE SMOKE EVAC HIGH FLOW (TUBING) ×2 IMPLANT
SHEET LAVH (DRAPES) ×2 IMPLANT
SLING LYNX SUPRAPUBIC (Sling) IMPLANT
SLING LYNX SUPRAPUBIC BX (SLING) IMPLANT
SOL ELECTROSURG ANTI STICK (MISCELLANEOUS) ×2
SOL PREP POV-IOD 4OZ 10% (MISCELLANEOUS) IMPLANT
SOLUTION ELECTROSURG ANTI STCK (MISCELLANEOUS) ×2 IMPLANT
SURGILUBE 2OZ TUBE FLIPTOP (MISCELLANEOUS) IMPLANT
SUT MNCRL AB 4-0 PS2 18 (SUTURE) ×4 IMPLANT
SUT MON AB 3-0 SH 27 (SUTURE)
SUT MON AB 3-0 SH27 (SUTURE) IMPLANT
SUT PROLENE 2 0 CT 1 (SUTURE) ×2 IMPLANT
SUT VIC AB 0 CT1 27 (SUTURE) ×2
SUT VIC AB 0 CT1 27XBRD ANTBC (SUTURE) ×2 IMPLANT
SUT VIC AB 0 CT1 36 (SUTURE) IMPLANT
SUT VIC AB 2-0 SH 27 (SUTURE) ×16
SUT VIC AB 2-0 SH 27XBRD (SUTURE) ×10 IMPLANT
SUT VIC AB 3-0 SH 27 (SUTURE) ×2
SUT VIC AB 3-0 SH 27X BRD (SUTURE) ×2 IMPLANT
SUT VICRYL 0 UR6 27IN ABS (SUTURE) ×2 IMPLANT
SUT VLOC 180 3-0 9IN GS21 (SUTURE) IMPLANT
SYR 50ML LL SCALE MARK (SYRINGE) IMPLANT
SYS BAG RETRIEVAL 10MM (BASKET)
SYSTEM BAG RETRIEVAL 10MM (BASKET) IMPLANT
TOWEL OR 17X26 10 PK STRL BLUE (TOWEL DISPOSABLE) ×2 IMPLANT
TRAY LAPAROSCOPIC (CUSTOM PROCEDURE TRAY) ×2 IMPLANT
TROCAR Z THREAD OPTICAL 12X100 (TROCAR) ×2 IMPLANT
TROCAR Z-THREAD FIOS 5X100MM (TROCAR) ×2 IMPLANT
TUBING CONNECTING 10 (TUBING) ×2 IMPLANT
TUBING UROLOGY SET (TUBING) IMPLANT
WATER STERILE IRR 1000ML POUR (IV SOLUTION) ×2 IMPLANT
WATER STERILE IRR 3000ML UROMA (IV SOLUTION) IMPLANT

## 2023-05-08 NOTE — Transfer of Care (Signed)
Immediate Anesthesia Transfer of Care Note  Patient: Traci [name]  Procedure(s) Performed: XI ROBOTIC ASSISTED LAPAROSCOPIC SUPRACERVICAL  HYSTERECTOMY AND BILATERAL SALPINGO-OOPHORECTOMY, MID-URETHRAL SLING (Bilateral) XI ROBOTIC ASSISTED LAPAROSCOPIC SACROCOLPOPEXY CYSTOSCOPY with FIREFLY INJECTION  Patient Location: PACU  Anesthesia Type:General  Level of Consciousness: drowsy  Airway & Oxygen Therapy: Patient Spontanous Breathing and Patient connected to face mask oxygen  Post-op Assessment: Report given to RN, Post -op Vital signs reviewed and stable, and Patient moving all extremities X 4  Post vital signs: Reviewed and stable  Last Vitals:  Vitals Value Taken Time  BP 144/67 05/08/23 1303  Temp    Pulse 70 05/08/23 1305  Resp 14 05/08/23 1305  SpO2 100 % 05/08/23 1305  Vitals shown include unvalidated device data.  Last Pain:  Vitals:   05/08/23 0621  TempSrc:   PainSc: 0-No pain         Complications: No notable events documented.

## 2023-05-08 NOTE — Anesthesia Procedure Notes (Signed)
Procedure Name: Intubation Date/Time: 05/08/2023 7:30 AM  Performed by: Nelle Don, CRNAPre-anesthesia Checklist: Patient identified, Emergency Drugs available, Suction available and Patient being monitored Patient Re-evaluated:Patient Re-evaluated prior to induction Oxygen Delivery Method: Circle system utilized Preoxygenation: Pre-oxygenation with 100% oxygen Induction Type: IV induction Ventilation: Mask ventilation without difficulty Laryngoscope Size: Mac and 4 Grade View: Grade I Tube type: Oral Tube size: 7.0 mm Number of attempts: 1 Airway Equipment and Method: Stylet Placement Confirmation: positive ETCO2, ETT inserted through vocal cords under direct vision and breath sounds checked- equal and bilateral Secured at: 22 cm Tube secured with: Tape Dental Injury: Teeth and Oropharynx as per pre-operative assessment

## 2023-05-08 NOTE — Interval H&P Note (Signed)
History and Physical Interval Note:  05/08/2023 7:41 AM  Traci [name]  has presented today for surgery, with the diagnosis of PELVIC ORGAN PROLAPSE STRESS URINARY INCONTINENCE.  The various methods of treatment have been discussed with the patient and family. After consideration of risks, benefits and other options for treatment, the patient has consented to  Procedure(s) with comments: XI ROBOTIC ASSISTED LAPAROSCOPIC SUPRACERVICAL  HYSTERECTOMY AND BILATERAL SALPINGO-OOPHORECTOMY, MID-URETHRAL SLING (Bilateral) - 240 MINUTES XI ROBOTIC ASSISTED LAPAROSCOPIC SACROCOLPOPEXY (N/A) CYSTOSCOPY with FIREFLY INJECTION (N/A) as a surgical intervention.  The patient's history has been reviewed, patient examined, no change in status, stable for surgery.  I have reviewed the patient's chart and labs.  Questions were answered to the patient's satisfaction.     Crist Fat

## 2023-05-08 NOTE — Anesthesia Postprocedure Evaluation (Signed)
Anesthesia Post Note  Patient: Traci [name]  Procedure(s) Performed: XI ROBOTIC ASSISTED LAPAROSCOPIC SUPRACERVICAL  HYSTERECTOMY AND BILATERAL SALPINGO-OOPHORECTOMY, MID-URETHRAL SLING (Bilateral) XI ROBOTIC ASSISTED LAPAROSCOPIC SACROCOLPOPEXY CYSTOSCOPY with FIREFLY INJECTION     Patient location during evaluation: PACU Anesthesia Type: General Level of consciousness: awake and alert Pain management: pain level controlled Vital Signs Assessment: post-procedure vital signs reviewed and stable Respiratory status: spontaneous breathing, nonlabored ventilation, respiratory function stable and patient connected to nasal cannula oxygen Cardiovascular status: blood pressure returned to baseline and stable Postop Assessment: no apparent nausea or vomiting Anesthetic complications: no  No notable events documented.  Last Vitals:  Vitals:   05/08/23 1355 05/08/23 1400  BP:  (!) 116/56  Pulse: 68 69  Resp: 10 13  Temp:  36.6 C  SpO2: 98% 98%    Last Pain:  Vitals:   05/08/23 1350  TempSrc:   PainSc: 0-No pain                 Yanisa Goodgame L Sargent Mankey

## 2023-05-08 NOTE — H&P (Signed)
cc: Hydronephrosis   82 year old woman who recently had a TAVR found to have mild bilateral hydronephrosis on CTA. Patient has no flank pain. Ms. Bellinger is an 82 year old female who was fit with a size 5 Gehrung pessary. She reports that she does like not having to worry about her prolapse but she began having significant incontinence. She will leak on standing and moving. She will also leak occasionally at night. He is using the estrogen cream. She does endorse some slight dysuria. Her PVR is 0 mL.   Was fit with a size 5 Gehrung pessary. Since her fitting, she has had significant urinary leakage and reports that she has not felt very well. She does like that the pessary is in place and keeps her prolapse out of her way but she cannot tolerate the leakage. She presents today for pessary removal and a urinalysis.   12/11/22: failed pessary   Interval: Today the patient is here for follow-up and discussion of sacrocolpopexy. She has not been using the pessary because of her flooding incontinence. She denies any flank pain. She has not been treated for any infections recently, she was hospitalized in January. The patient has history of cholecystectomy but no other abdominal surgeries. She still has her uterus, fallopian tubes, and ovaries. She has not undergone urodynamics. She does have severe voiding symptoms.     ALLERGIES: No Known Allergies    MEDICATIONS: Aspirin 325 mg tablet  Estrace 0.01 % cream with applicator Insert 1 application daily for 2 weeks. Then use only 3 nights per week  Lisinopril 20 mg tablet  Amoxicillin  Atorvastatin Calcium 20 mg tablet  Pantoprazole Sodium 40 mg tablet, delayed release  Vitamin B12  Vitamin D3     GU PSH: Locm 300-399Mg /Ml Iodine,1Ml - 11/15/2022     NON-GU PSH: Remove Gallbladder     GU PMH: Complete uterovaginal prolapse - 02/27/2023, - 12/11/2022, - 11/30/2022, - 11/26/2022, - 11/22/2022, - 11/02/2022 Hydronephrosis - 02/27/2023, - 12/11/2022, -  11/26/2022, - 11/15/2022, - 11/02/2022 Mixed incontinence - 02/27/2023, - 12/11/2022 Cystocele, midline - 12/11/2022, - 11/30/2022, - 2017 Acute Cystitis/UTI - 11/30/2022 Nocturia - 2017 Rectocele - 2017 Urge incontinence - 2017    NON-GU PMH: Cardiac murmur, unspecified GERD Heart disease, unspecified Heartburn Hypercholesterolemia Hypertension    FAMILY HISTORY: 3 Son's - Son Death - Mother, Father   SOCIAL HISTORY: Marital Status: Widowed Preferred Language: English; Ethnicity: Not Hispanic Or Latino; Race: White Current Smoking Status: Patient does not smoke anymore. Smoked for 10 years.  Drinks 1 caffeinated drink per day. Patient's occupation is/was retired.    REVIEW OF SYSTEMS:    GU Review Female:   Patient denies frequent urination, hard to postpone urination, burning /pain with urination, get up at night to urinate, leakage of urine, stream starts and stops, trouble starting your stream, have to strain to urinate, and being pregnant.  Gastrointestinal (Upper):   Patient denies nausea, vomiting, and indigestion/ heartburn.  Gastrointestinal (Lower):   Patient denies diarrhea and constipation.  Constitutional:   Patient denies fever, night sweats, weight loss, and fatigue.  Skin:   Patient denies skin rash/ lesion and itching.  Eyes:   Patient denies blurred vision and double vision.  Ears/ Nose/ Throat:   Patient denies sore throat and sinus problems.  Hematologic/Lymphatic:   Patient denies swollen glands and easy bruising.  Cardiovascular:   Patient denies leg swelling and chest pains.  Respiratory:   Patient denies cough and shortness of breath.  Endocrine:  Patient denies excessive thirst.  Musculoskeletal:   Patient denies back pain and joint pain.  Neurological:   Patient denies headaches and dizziness.  Psychologic:   Patient denies depression and anxiety.   VITAL SIGNS: None   Complexity of Data:  Source Of History:  Patient  Records Review:   Previous Doctor  Records, Previous Hospital Records, Previous Patient Records, POC Tool  Urine Test Review:   Urinalysis, Urine Culture  Urodynamics Review:   Review Bladder Scan   PROCEDURES:          Visit Complexity - G2211    ASSESSMENT:      ICD-10 Details  1 GU:   Complete uterovaginal prolapse - N81.3   2   Cystocele, midline - N81.11   3   Hydronephrosis - N13.0      PLAN:           Orders Labs Urine Culture          Document Letter(s):  Created for Patient: Clinical Summary         Notes:   I went over robotic-assisted laparoscopic sacral colpopexy with the patient detail. I explained to the patient the rationale for the surgery. I also went over the placement of the laparoscopic ports. I detailed to her the surgery as well as the postoperative recovery time. I explained to the patient that she could expect to be in the hospital at least one or 2 nights. She will require 4 weeks of no heavy lifting, 6 weeks of no bending or twisting. She will not be able to use her vagina for 6 weeks. I discussed complications of the operation including injury to bowel, ureters, bladder. We also discussed the risk of failure as well as the complications of mesh. I explained to them the difference between transvaginal mesh and the mesh used for sacral colpopexy. I reassured them that there has not been an FDA warnings in regards to sacral colpopexy mesh. We will plan to get this prior to her surgery. I spent 45 minutes with the patient going over that ins and outs of the surgery and answering all her questions. During the procedure, I would like to also instill firefly and of the ureters to help identify them given her associated hydroureteronephrosis.   In addition, we will plan to do a supracervical hysterectomy with bilateral salpingo-oophorectomy. Further, given the patient's severe stress incontinence she would benefit from a mid urethral sling concomitantly. I spoke to her about all of this as well and she  was eager to proceed.   CC: Michail Jewels, MD

## 2023-05-08 NOTE — Op Note (Signed)
Preoperative diagnosis:   Pelvic organ prolapse  Postoperative diagnosis:   Same  Procedure: Robotic-assisted laparoscopic supracervical hysterectomy and  bilateral salpingo-oopherectomy Robotic-assisted laparoscopic sacrocolpopexy Cystoscopy with ureteral injection of firefly bilaterally Mid-ureteral sling  Surgeon: Crist Fat, MD First assistant: Harrie Foreman, PA-C Resident surgeon: Jerald Kief  Anesthesia: General  Complications: None  Intraoperative findings:  [employer] Upsilon Y mesh used for the sacrocolpopexy.  EBL: 300 mL  Specimens: Uterus and proximal cervix, bilateral fallopian tubes and ovaries  Indication: Traci [name] is a 82 y.o. female patient with symptomatic pelvic organ prolapse.    After reviewing the management options for treatment, she elected to proceed with the above surgical procedure(s). We have discussed the potential benefits and risks of the procedure, side effects of the proposed treatment, the likelihood of the patient achieving the goals of the procedure, and any potential problems that might occur during the procedure or recuperation. Informed consent has been obtained.  Description of procedure:  The patient was taken to the operating room and general anesthesia was induced.  The patient was placed in the dorsal lithotomy position, prepped and draped in the usual sterile fashion, and preoperative antibiotics were administered. A preoperative time-out was performed.    A 21 French 30 degree cystoscope was gently passed through the patient's urethra into the bladder.  The bladder was subsequently emptied and then filled slowly up performing a 360 degrees cystoscopic evaluation.  This demonstrated orthotopic ureteral orifices, normal bladder mucosa.   I then advanced a 5 [place] open-ended ureteral catheter into the patient's left ureteral orifice and  I then advanced the catheter up into the proximal ureter and then slowly  pulled back and injected 7.23ml of the firefly contrast.  Subsequently turned my attention to the patient's right ureteral orifice and performed a similar task.    A Foley catheter was then placed and placed to gravity drainage. I then made a periumbilical incision carrying the dissection down to the patient's fascia with electrocautery.  Once to the fascia, the fascia was incised and a small puncture hole made in the peritoneum to allow passage of a 8mm port.   The abdomen was insufflated and the remaining ports placed under digital guidance.  2 ports were placed lateral to the umbilicus on the right proximally 10 cm apart.  The most lateral port was approximately 3 cm above the anterior iliac spine.  2 additional ports were placed in the patient's right side in comparable positions to the most lateral port on the right was a 12 mm port.the robot was then docked at an angle from the leg obliquely along the side of the left leg.  We then began our surgery by cleaning up some of the pelvic adhesions to the small bowel and colon.  Once this was completed I started dissecting at the sacral promontory located 3 cm medial to the location where the ureter crosses over the right iliac vessels at the pelvic brim. The posterior peritoneum was incised and the sacral prominence cleared off an area taking care to avoid the middle sacral vessels and the iliac branches.  I then created a posterior peritoneal tunnel starting at the sacral promontory and tunneling down the right pelvic sidewall down into the pelvis breaking back through the posterior peritoneum around the vesico-vaginal junction posteriorly.  I then continued the posterior dissection retracting down on the rectum and finding the avascular plane between the posterior vaginal wall and the rectum.  I carried this dissection down  as far as I could to along the area of the perineal body.  Then focused my attention to the uterus and hysterectomy.  I first started by  taking the right round ligament with a series of by polar cautery.  I then dissected the anterior leaf of the broad ligament slightly more proximal and then distally down across the anterior mucosa to the salpinx and the internal cervical os.  I then took of the uterine ovarian ligament on the right and dissected free the right salpinx. Once the anterior leaf of the broad ligament had been completely dissected on the patient's right and a small bladder flap had been created anteriorly attention was turned to the left side where a similar dissection was carried out. I then turned my attention to the anterior plane between the anterior vaginal wall and the bladder.  I was able to obtain access to the avascular plane and with a combination of both monopolar cautery and blunt dissection was able to get down to the bladder neck.  I then turned my attention back to the patient's uterus and skeletonized the right uterine artery and vein and then took this with a series of bipolar moves.  I then performed a similar uterus pedicle ligation on the left.  At his point I was able to identify the patient's cervix and came through supracervical with monopolar cautery once the uterus was freed from all its attachments it was pushed into the left paracolic gutter and our attention was turned to placing the mesh.  Mesh was measured at approximately 12 cm anteriorly and 12.5 cm posteriorly and I cut this on the back table.  The mesh was then placed into the patient's abdomen through the assistant port and the anterior leaf was secured down onto the anterior vaginal wall with the apex at the bladder neck.  The posterior leaf was then secured down on the posterior vaginal wall.  These were sewn down with 2-0 Vicryl.  Between 6 and 8 were done on each side.  At this point I then went back to the previously dissected sacral promontory and posterior peritoneal tunnel and inserted a instrument through the tunnel and grasped the end of  the mesh at the vaginal cuff and pull it up to the sacrum.  I then checked to ensure that the sacral mesh was not too tight by performing a vaginal exam.  I then secured the sacral leg of the mesh using a 0 Prolene.  I then reapproximated the posterior peritoneum with a 2-0 Vicryl in a running fashion around the sacral promontory.  The pelvic peritoneum was closed using a pursestring.  A small Endo Catch bag was then gently passed through the assistant port and the uterus was placed in the bag.  The bag was then brought out through the camera port once the trochars were removed.  We then made a slightly larger extraction incision to remove the uterus.  The fascia was then closed with 0 Vicryl in a figure-of-eight fashion.  The skin was closed with 4-0 Monocryl's.  Dermabond was applied to the incision and exparel injected into the incisions.  The Foley catheter was then capped. The horseshoe [place] retractor was then applied to the drape and the Foley catheter was attached to it. Using the skin hooks for skin nodes were placed in the corners of the labia minora to open up the vaginal vestibule. 5 cc of quarter percent Marcaine with 1% epinephrine was then injected into the periurethral  tissue. The suprapubic incisions were then marked out 1 cm lateral to midline and 1 cm above the pubic bone. Using a 15 blade a stab incision was made in both sides of midline. Gauze is placed over these areas to control the skin bleeding. A 1.5 cm incision was then made in the mid urethra through the vaginal mucosa. Using this Lee scissors dissection was then carried out. Her urethra we laterally on both sides to the endopelvic fascia. The dissection was completed once there was enough space to place a finger through the incision on both sides of the urethra. Using the [facility] needle set both needles were passed through the stab incisions suprapubically down posterior to the pubic bone and then through the  periurethral incisions using the index finger to guide the needle out of the incision. Once both needles had been placed the Foley catheter was removed and cystoscopy was performed. A 70 lens was passed gently through the patient's urethra and into the bladder under visual guidance. A 360 cystoscopic evaluation was performed. There was no mucosal abnormalities or evidence of perforation from the needles. The cystoscope was then removed and the Foley catheter replaced. The bladder was then drained again and the Foley catheter. The ends of the sling were then attached to the needles and the needles pulled up through the retropubic space and out the suprapubic incision. Once the sling was noted to be centered, the blue plastic cap at the apex of the sling was cut and the plastic sheath was then pulled out. The mesh was noted to be well seated around the urethra. A right angle was used to ensure that the proper amount of tension for the sling around the urethra applied. The sling was noted to be tension free and well positioned. At this point copious amounts of double antibiotic irrigation was then used to irrigate the incision the periurethral space and the vagina. The incision was then closed with 2-0 Vicryl in a running fashion. The stab incisions in the suprapubic area were closed with Dermabond. The vagina was then packed with clindamycin impregnated vaginal packing. The patient was subsequently extubated and returned to the PACU in stable condition.  Estrace impregnated packing was then placed into her vagina which will be left in overnight.  The patient was subsequently extubated and returned to the PACU in excellent condition.

## 2023-05-08 NOTE — Progress Notes (Signed)
[facility] RN Report to floor given  Gave report to [employer]. 1407   Room: Discussed surgery, meds given in OR and [facility], VS, IV fluids given, EBL, urine output, pain and other pertinent information. Also discussed if pt had any family or friends here or belongings with them.   Pt has 6 lapsites and 2 poke/trocantor sites all with dermabond, CDI, no bleeding or hematoma noted. Pt has vaginal packing in, a peripad and mesh pants, no bleeding noted.   Pt was cold T 95.7 temporal when she arrived to Pac[facility], she was on a bear hugger with good effect. She is normal temp now of 97.8.   Denies pain. Pt is on 3L [place].   Her family has been updated.   Pt exits my care.

## 2023-05-08 NOTE — Discharge Instructions (Addendum)
Activity:  You are encouraged to ambulate frequently (about every hour during waking hours) to help prevent blood clots from forming in your legs or lungs.  However, you should not engage in any heavy lifting (> 10-15 lbs), strenuous activity, or straining. Diet: You should advance your diet as instructed by your physician.  It will be normal to have some bloating, nausea, and abdominal discomfort intermittently. Prescriptions:  You will be provided a prescription for pain medication to take as needed.  If your pain is not severe enough to require the prescription pain medication, you may take extra strength Tylenol instead which will have less side effects.  You should also take a prescribed stool softener to avoid straining with bowel movements as the prescription pain medication may constipate you. Incisions: You may remove your dressing bandages 48 hours after surgery if not removed in the hospital.  You will either have some small staples or special tissue glue at each of the incision sites. Once the bandages are removed (if present), the incisions may stay open to air.  You may start showering (but not soaking or bathing in water) the 2nd day after surgery and the incisions simply need to be patted dry after the shower.  No additional care is needed. What to call us about: You should call the office [phone]) if you develop fever > 101 or develop persistent vomiting.  Hold Lisinopril until you see your primary care doctor.  You may resume aspirin, advil, aleve, vitamins, and supplements 7 days after surgery.

## 2023-05-09 ENCOUNTER — Encounter (HOSPITAL_COMMUNITY): Payer: Self-pay | Admitting: Urology

## 2023-05-09 DIAGNOSIS — N813 Complete uterovaginal prolapse: Secondary | ICD-10-CM | POA: Diagnosis not present

## 2023-05-09 DIAGNOSIS — N393 Stress incontinence (female) (male): Secondary | ICD-10-CM | POA: Diagnosis not present

## 2023-05-09 DIAGNOSIS — Z79899 Other long term (current) drug therapy: Secondary | ICD-10-CM | POA: Diagnosis not present

## 2023-05-09 DIAGNOSIS — Z87891 Personal history of nicotine dependence: Secondary | ICD-10-CM | POA: Diagnosis not present

## 2023-05-09 DIAGNOSIS — N1330 Unspecified hydronephrosis: Secondary | ICD-10-CM | POA: Diagnosis not present

## 2023-05-09 DIAGNOSIS — N8111 Cystocele, midline: Secondary | ICD-10-CM | POA: Diagnosis not present

## 2023-05-09 DIAGNOSIS — N819 Female genital prolapse, unspecified: Secondary | ICD-10-CM | POA: Diagnosis not present

## 2023-05-09 DIAGNOSIS — I119 Hypertensive heart disease without heart failure: Secondary | ICD-10-CM | POA: Diagnosis not present

## 2023-05-09 LAB — HEMOGLOBIN AND HEMATOCRIT, BLOOD
HCT: 27.5 % — ABNORMAL LOW (ref 36.0–46.0)
Hemoglobin: 8.6 g/dL — ABNORMAL LOW (ref 12.0–15.0)

## 2023-05-09 LAB — BASIC METABOLIC PANEL
Anion gap: 7 (ref 5–15)
BUN: 15 mg/dL (ref 8–23)
CO2: 24 mmol/L (ref 22–32)
Calcium: 7.9 mg/dL — ABNORMAL LOW (ref 8.9–10.3)
Chloride: 106 mmol/L (ref 98–111)
Creatinine, Ser: 1.04 mg/dL — ABNORMAL HIGH (ref 0.44–1.00)
GFR, Estimated: 54 mL/min — ABNORMAL LOW (ref >60)
Glucose, Bld: 113 mg/dL — ABNORMAL HIGH (ref 70–99)
Potassium: 3.9 mmol/L (ref 3.5–5.1)
Sodium: 137 mmol/L (ref 135–145)

## 2023-05-09 MED ORDER — ACETAMINOPHEN 325 MG PO TABS
650.0000 mg | ORAL_TABLET | Freq: Four times a day (QID) | ORAL | Status: DC | PRN
  Administered 2023-05-09 – 2023-05-10 (×3): 650 mg via ORAL
  Filled 2023-05-09 (×3): qty 2

## 2023-05-09 NOTE — Care Management Obs Status (Signed)
MEDICARE OBSERVATION STATUS NOTIFICATION   Patient Details  Name: Traci [name] MRN: [id number] Date of Birth: May 25, 1941   Medicare Observation Status Notification Given:  Yes    Larrie Kass, LCSW 05/09/2023, 3:17 PM

## 2023-05-09 NOTE — TOC Initial Note (Signed)
Transition of Care [facility]) - Initial/Assessment Note    Patient Details  Name: Traci [name] MRN: [id number] Date of Birth: [birth year]  Transition of Care [facility]) CM/SW Contact:    Coralyn Helling, LCSW Phone Number: 05/09/2023, 11:10 AM  Clinical Narrative:                Transition of Care [facility]) - Inpatient Brief Assessment   Patient Details  Name: Traci [name] MRN: [id number] Date of Birth: 09-16-1941  Transition of Care [facility]) CM/SW Contact:    Coralyn Helling, LCSW Phone Number: 05/09/2023, 11:11 AM   Clinical Narrative:  No needs identified at screening.     Transition of Care Asessment: Insurance and Status: Insurance coverage has been reviewed Patient has primary care physician: Yes Home environment has been reviewed: Home alone Prior level of function:: Independent Prior/Current Home Services: No current home services Social Determinants of Health Reivew: SDOH reviewed no interventions necessary Readmission risk has been reviewed: Yes Transition of care needs: no transition of care needs at this time          Patient Goals and CMS Choice            Expected Discharge Plan and Services                                              Prior Living Arrangements/Services                       Activities of Daily Living Home Assistive Devices/Equipment: None ADL Screening (condition at time of admission) Patient's cognitive ability adequate to safely complete daily activities?: Yes Is the patient deaf or have difficulty hearing?: No Does the patient have difficulty seeing, even when wearing glasses/contacts?: No Does the patient have difficulty concentrating, remembering, or making decisions?: No Patient able to express need for assistance with ADLs?: Yes Does the patient have difficulty dressing or bathing?: No Independently performs ADLs?: Yes (appropriate for developmental age) Does the patient have difficulty walking or climbing  stairs?: No Weakness of Legs: None Weakness of Arms/Hands: None  Permission Sought/Granted                  Emotional Assessment              Admission diagnosis:  Cystocele with prolapse [N81.4] Patient Active Problem List   Diagnosis Date Noted   Cystocele with prolapse 05/08/2023   Pyelonephritis of left kidney 12/14/2022   Hydroureteronephrosis 12/14/2022   Adrenal nodule (HCC) 12/14/2022   Uterine prolapse 09/20/2022   S/P TAVR (transcatheter aortic valve replacement) 09/04/2022   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   Severe aortic stenosis    Essential hypertension 07/12/2016   Hyperlipidemia 07/12/2016   Mild obesity 07/12/2016   PCP:  Geoffry Paradise, MD Pharmacy:   [facility], [place] - [address] [place] [address] Phone: [phone] Fax: [phone]     Social Determinants of Health (SDOH) Social History: SDOH Screenings   Food Insecurity: No Food Insecurity (05/08/2023)  Housing: Low Risk  (05/08/2023)  Transportation Needs: No Transportation Needs (05/08/2023)  Utilities: Not At Risk (05/08/2023)  Depression (PHQ2-9): Low Risk  (01/25/2023)  Tobacco Use: Low Risk  (05/09/2023)   SDOH Interventions:     Readmission Risk Interventions    09/06/2022  11:21 AM  Readmission Risk Prevention Plan  Post Dischage Appt Complete  Medication Screening Complete  Transportation Screening Complete

## 2023-05-09 NOTE — Progress Notes (Signed)
Urology Inpatient Progress Report  Cystocele with prolapse [N81.4]  Procedure(s): XI ROBOTIC ASSISTED LAPAROSCOPIC SUPRACERVICAL  HYSTERECTOMY AND BILATERAL SALPINGO-OOPHORECTOMY, MID-URETHRAL SLING XI ROBOTIC ASSISTED LAPAROSCOPIC SACROCOLPOPEXY CYSTOSCOPY with FIREFLY INJECTION  1 Day Post-Op   Intv/Subj: No acute events overnight. Patient is without complaint. Soft blood pressure this AM Slight dizziness at times No nausea Packing and foley out at 5am  Principal Problem:   Cystocele with prolapse  Current Facility-Administered Medications  Medication Dose Route Frequency Provider Last Rate Last Admin   0.45 % sodium chloride infusion   Intravenous Continuous Harrie Foreman, PA-C 50 mL/hr at 05/09/23 0981 Infusion Verify at 05/09/23 0627   acetaminophen (OFIRMEV) IV 1,000 mg  1,000 mg Intravenous Q6H Harrie Foreman, PA-C 400 mL/hr at 05/09/23 0531 1,000 mg at 05/09/23 0531   atorvastatin (LIPITOR) tablet 20 mg  20 mg Oral QHS Harrie Foreman, PA-C   20 mg at 05/08/23 2144   diphenhydrAMINE (BENADRYL) injection 12.5 mg  12.5 mg Intravenous Q6H PRN Harrie Foreman, PA-C       Or   diphenhydrAMINE (BENADRYL) 12.5 MG/5ML elixir 12.5 mg  12.5 mg Oral Q6H PRN Dancy, Amanda, PA-C       docusate sodium (COLACE) capsule 100 mg  100 mg Oral BID Harrie Foreman, PA-C   100 mg at 05/08/23 2144   hyoscyamine (LEVSIN SL) SL tablet 0.125 mg  0.125 mg Sublingual Q4H PRN Harrie Foreman, PA-C       lisinopril (ZESTRIL) tablet 20 mg  20 mg Oral Daily Harrie Foreman, PA-C   20 mg at 05/08/23 1628   ondansetron (ZOFRAN) injection 4 mg  4 mg Intravenous Q4H PRN Harrie Foreman, PA-C       pantoprazole (PROTONIX) EC tablet 40 mg  40 mg Oral QPM Dancy, Amanda, PA-C   40 mg at 05/08/23 1629   traMADol (ULTRAM) tablet 50-100 mg  50-100 mg Oral Q6H PRN Harrie Foreman, PA-C         Objective: Vital: Vitals:   05/08/23 1831 05/08/23 2302 05/09/23 0724 05/09/23 0750  BP: (!) 106/55 (!) 113/48 (!) 89/44 (!) 92/49   Pulse: 72 81 70   Resp: 20 18 18    Temp: 97.7 F (36.5 C) 97.7 F (36.5 C) 97.6 F (36.4 C)   TempSrc: Axillary Oral Oral   SpO2: 96% 95% 100%   Weight:      Height:       I/Os: I/O last 3 completed shifts: In: 3091 [P.O.:120; I.V.:2558; IV Piggyback:413] Out: 930 [Urine:630; Blood:300]  Physical Exam:  General: Patient is in no apparent distress Lungs: Normal respiratory effort, chest expands symmetrically. GI: Incisions are c/d/i.  The abdomen is soft and nontender without mass. Foley: out  Ext: lower extremities symmetric  Lab Results: Recent Labs    05/08/23 1429 05/09/23 0654  HGB 10.4* 8.6*  HCT 33.4* 27.5*   Recent Labs    05/09/23 0529  NA 137  K 3.9  CL 106  CO2 24  GLUCOSE 113*  BUN 15  CREATININE 1.04*  CALCIUM 7.9*   No results for input(s): "LABPT", "INR" in the last 72 hours. No results for input(s): "LABURIN" in the last 72 hours. Results for orders placed or performed during the hospital encounter of 04/25/23  Urine Culture     Status: None   Collection Time: 04/25/23  1:48 PM   Specimen: Urine, Clean Catch  Result Value Ref Range Status   Specimen Description   Final    URINE, CLEAN CATCH Performed at [facility], [address] [place], [place] [address]    Special Requests   Final    NONE Performed at [facility], [address] [place], [place] [address]    Culture   Final    NO GROWTH Performed at [facility], [address], [place], [place] [address]    Report Status 04/26/2023 FINAL  Final    Studies/Results: No results found.  Assessment: Procedure(s): XI ROBOTIC ASSISTED LAPAROSCOPIC SUPRACERVICAL  HYSTERECTOMY AND BILATERAL SALPINGO-OOPHORECTOMY, MID-URETHRAL SLING XI ROBOTIC ASSISTED LAPAROSCOPIC SACROCOLPOPEXY CYSTOSCOPY with FIREFLY INJECTION, 1 Day Post-Op  doing well. Anemia from surgical blood loss  Plan: Needs to void Re-eval this PM for  discharge Normalize ADAT Ambulate  Berniece Salines, MD Urology 05/09/2023, 8:00 AM

## 2023-05-09 NOTE — Evaluation (Signed)
Physical Therapy Evaluation Patient Details Name: Traci [name] MRN: [id number] DOB: 09-29-1941 Today's Date: 05/09/2023  History of Present Illness  Pt is an 82 year old female s/p Robotic-assisted laparoscopic supracervical hysterectomy and  bilateral salpingo-oopherectomy due to pelvic organ prolapse.  PMHx: TAVR, HTN  Clinical Impression  Pt admitted with above diagnosis.  Pt currently with functional limitations due to the deficits listed below (see PT Problem List). Pt will benefit from acute skilled PT to increase their independence and safety with mobility to allow discharge.  Pt educated on log roll technique for bed mobility to assist with healing and pain control.  Pt ambulated in hallway however did report dizziness.  BP obtained upon sitting on bed after ambulating as below and RN notified.  Pt plans to return home with family assist 24/7 upon d/c.   05/09/23 1600  Vital Signs  Pulse Rate 79  Pulse Rate Source Monitor  BP (!) 90/45  BP Location Left Arm  BP Method Automatic  Patient Position (if appropriate) Sitting  Oxygen Therapy  SpO2 99 %  O2 Device Room Air    BP upon laying supine 105/39 mmHg (HR 80 bpm) end of session.        Recommendations for follow up therapy are one component of a multi-disciplinary discharge planning process, led by the attending physician.  Recommendations may be updated based on patient status, additional functional criteria and insurance authorization.  Follow Up Recommendations       Assistance Recommended at Discharge PRN  Patient can return home with the following  Help with stairs or ramp for entrance;Assistance with cooking/housework    Equipment Recommendations None recommended by PT  Recommendations for Other Services       Functional Status Assessment Patient has had a recent decline in their functional status and demonstrates the ability to make significant improvements in function in a reasonable and predictable amount  of time.     Precautions / Restrictions Precautions Precautions: Fall      Mobility  Bed Mobility Overal bed mobility: Needs Assistance Bed Mobility: Rolling, Sidelying to Sit, Sit to Sidelying Rolling: Min guard Sidelying to sit: Mod assist     Sit to sidelying: Mod assist General bed mobility comments: cues for log roll technique for pain control, assist for trunk upright and LEs upon returning to bed    Transfers Overall transfer level: Needs assistance Equipment used: Rolling walker (2 wheels) Transfers: Sit to/from Stand Sit to Stand: Min guard           General transfer comment: cues for hand placement    Ambulation/Gait Ambulation/Gait assistance: Min guard Gait Distance (Feet): 120 Feet Assistive device: Rolling walker (2 wheels) Gait Pattern/deviations: Step-through pattern, Decreased stride length, Trunk flexed       General Gait Details: cues for upright posture as tolerated, pt reports dizziness after 60 feet so returned to room; BP obtained  Stairs            Wheelchair Mobility    Modified Rankin (Stroke Patients Only)       Balance                                             Pertinent Vitals/Pain Pain Assessment Pain Assessment: Faces Faces Pain Scale: Hurts little more Pain Location: abdomen with transitional movements Pain Descriptors / Indicators: Sore Pain Intervention(s): Repositioned, Monitored  during session    Home Living Family/patient expects to be discharged to:: Private residence Living Arrangements: Alone Available Help at Discharge: Family;Friend(s);Available PRN/intermittently Type of Home: House       Alternate Level Stairs-Number of Steps: split level - 5 steps to each level Home Layout: Two level Home Equipment: Rolling Walker (2 wheels) Additional Comments: plans to have 24/7 assist from family upon d/c.    Prior Function Prior Level of Function : Independent/Modified Independent              Mobility Comments: typically independent without DME       Hand Dominance        Extremity/Trunk Assessment        Lower Extremity Assessment Lower Extremity Assessment: Overall WFL for tasks assessed    Cervical / Trunk Assessment Cervical / Trunk Assessment: Normal  Communication   Communication: No difficulties  Cognition Arousal/Alertness: Awake/alert Behavior During Therapy: WFL for tasks assessed/performed Overall Cognitive Status: Within Functional Limits for tasks assessed                                          General Comments      Exercises     Assessment/Plan    PT Assessment Patient needs continued PT services  PT Problem List Decreased activity tolerance;Decreased strength;Decreased mobility;Decreased knowledge of use of DME;Cardiopulmonary status limiting activity       PT Treatment Interventions DME instruction;Therapeutic exercise;Gait training;Functional mobility training;Therapeutic activities;Patient/family education;Stair training;Balance training    PT Goals (Current goals can be found in the Care Plan section)  Acute Rehab PT Goals PT Goal Formulation: With patient/family Time For Goal Achievement: 05/23/23 Potential to Achieve Goals: Good    Frequency Min 1X/week     Co-evaluation               AM-PAC PT "6 Clicks" Mobility  Outcome Measure Help needed turning from your back to your side while in a flat bed without using bedrails?: A Little Help needed moving from lying on your back to sitting on the side of a flat bed without using bedrails?: A Lot Help needed moving to and from a bed to a chair (including a wheelchair)?: A Little Help needed standing up from a chair using your arms (e.g., wheelchair or bedside chair)?: A Little Help needed to walk in hospital room?: A Little Help needed climbing 3-5 steps with a railing? : A Little 6 Click Score: 17    End of Session Equipment Utilized  During Treatment: Gait belt Activity Tolerance: Patient tolerated treatment well Patient left: in bed;with call bell/phone within reach;with family/visitor present Nurse Communication: Mobility status PT Visit Diagnosis: Difficulty in walking, not elsewhere classified (R26.2)    Time: 1550-1611 PT Time Calculation (min) (ACUTE ONLY): 21 min   Charges:   PT Evaluation $PT Eval Low Complexity: 1 Low         Kati PT, DPT Physical Therapist Acute Rehabilitation Services Office: [phone]   Janan Halter [place] 05/09/2023, 4:36 PM

## 2023-05-10 ENCOUNTER — Encounter (HOSPITAL_COMMUNITY): Payer: Self-pay | Admitting: Urology

## 2023-05-10 DIAGNOSIS — N819 Female genital prolapse, unspecified: Secondary | ICD-10-CM | POA: Diagnosis not present

## 2023-05-10 DIAGNOSIS — N8111 Cystocele, midline: Secondary | ICD-10-CM | POA: Diagnosis not present

## 2023-05-10 DIAGNOSIS — N813 Complete uterovaginal prolapse: Secondary | ICD-10-CM | POA: Diagnosis not present

## 2023-05-10 DIAGNOSIS — Z87891 Personal history of nicotine dependence: Secondary | ICD-10-CM | POA: Diagnosis not present

## 2023-05-10 DIAGNOSIS — Z79899 Other long term (current) drug therapy: Secondary | ICD-10-CM | POA: Diagnosis not present

## 2023-05-10 DIAGNOSIS — I119 Hypertensive heart disease without heart failure: Secondary | ICD-10-CM | POA: Diagnosis not present

## 2023-05-10 DIAGNOSIS — N393 Stress incontinence (female) (male): Secondary | ICD-10-CM | POA: Diagnosis not present

## 2023-05-10 DIAGNOSIS — N1330 Unspecified hydronephrosis: Secondary | ICD-10-CM | POA: Diagnosis not present

## 2023-05-10 LAB — HEMOGLOBIN AND HEMATOCRIT, BLOOD
HCT: 22.6 % — ABNORMAL LOW (ref 36.0–46.0)
HCT: 24.6 % — ABNORMAL LOW (ref 36.0–46.0)
Hemoglobin: 7 g/dL — ABNORMAL LOW (ref 12.0–15.0)
Hemoglobin: 7.5 g/dL — ABNORMAL LOW (ref 12.0–15.0)

## 2023-05-10 LAB — SURGICAL PATHOLOGY

## 2023-05-10 MED ORDER — FERROUS SULFATE 325 (65 FE) MG PO TBEC: 325.0000 mg | DELAYED_RELEASE_TABLET | Freq: Two times a day (BID) | ORAL | 0 refills | Status: DC

## 2023-05-10 NOTE — Progress Notes (Signed)
Urology Inpatient Progress Report  Cystocele with prolapse [N81.4]  Procedure(s): XI ROBOTIC ASSISTED LAPAROSCOPIC SUPRACERVICAL  HYSTERECTOMY AND BILATERAL SALPINGO-OOPHORECTOMY, MID-URETHRAL SLING XI ROBOTIC ASSISTED LAPAROSCOPIC SACROCOLPOPEXY CYSTOSCOPY with FIREFLY INJECTION  2 Days Post-Op   Intv/Subj: No acute events overnight. Passed TOV.  UOP appropriate Still with soft BP this AM.  H and H pending  Tolerating a regular diet.   Principal Problem:   Cystocele with prolapse  Current Facility-Administered Medications  Medication Dose Route Frequency Provider Last Rate Last Admin   0.45 % sodium chloride infusion   Intravenous Continuous Harrie Foreman, PA-C 50 mL/hr at 05/09/23 1610 Infusion Verify at 05/09/23 9604   acetaminophen (TYLENOL) tablet 650 mg  650 mg Oral Q6H PRN Crist Fat, MD   650 mg at 05/10/23 0200   atorvastatin (LIPITOR) tablet 20 mg  20 mg Oral QHS Harrie Foreman, PA-C   20 mg at 05/09/23 2104   diphenhydrAMINE (BENADRYL) injection 12.5 mg  12.5 mg Intravenous Q6H PRN Harrie Foreman, PA-C       Or   diphenhydrAMINE (BENADRYL) 12.5 MG/5ML elixir 12.5 mg  12.5 mg Oral Q6H PRN Harrie Foreman, PA-C       docusate sodium (COLACE) capsule 100 mg  100 mg Oral BID Harrie Foreman, PA-C   100 mg at 05/09/23 2104   hyoscyamine (LEVSIN SL) SL tablet 0.125 mg  0.125 mg Sublingual Q4H PRN Harrie Foreman, PA-C       lisinopril (ZESTRIL) tablet 20 mg  20 mg Oral Daily Harrie Foreman, PA-C   20 mg at 05/08/23 1628   ondansetron (ZOFRAN) injection 4 mg  4 mg Intravenous Q4H PRN Harrie Foreman, PA-C       pantoprazole (PROTONIX) EC tablet 40 mg  40 mg Oral QPM Harrie Foreman, PA-C   40 mg at 05/09/23 2104   traMADol (ULTRAM) tablet 50-100 mg  50-100 mg Oral Q6H PRN Harrie Foreman, PA-C         Objective: Vital: Vitals:   05/09/23 1518 05/09/23 1600 05/09/23 2020 05/10/23 0533  BP: (!) 102/42 (!) 90/45 (!) 98/43 (!) 108/43  Pulse:  79 89 91  Resp: 18  19 19   Temp:  97.9 F (36.6 C)  98.6 F (37 C) 98.1 F (36.7 C)  TempSrc: Oral  Oral Oral  SpO2: 100% 99% 94% 97%  Weight:      Height:       I/Os: I/O last 3 completed shifts: In: 1396.1 [P.O.:600; I.V.:596.1; IV Piggyback:200] Out: 1550 [Urine:1550]  Physical Exam:  General: Patient is in no apparent distress Lungs: Normal respiratory effort, chest expands symmetrically. GI: Incisions are c/d/i.  The abdomen is soft and nontender without mass. GU-voiding spontaneously  Ext: lower extremities symmetric  Lab Results: Recent Labs    05/08/23 1429 05/09/23 0654  HGB 10.4* 8.6*  HCT 33.4* 27.5*    Recent Labs    05/09/23 0529  NA 137  K 3.9  CL 106  CO2 24  GLUCOSE 113*  BUN 15  CREATININE 1.04*  CALCIUM 7.9*    No results for input(s): "LABPT", "INR" in the last 72 hours. No results for input(s): "LABURIN" in the last 72 hours. Results for orders placed or performed during the hospital encounter of 04/25/23  Urine Culture     Status: None   Collection Time: 04/25/23  1:48 PM   Specimen: Urine, Clean Catch  Result Value Ref Range Status   Specimen Description   Final    URINE, CLEAN CATCH Performed at  [facility], [address], [place], [place] [address]    Special Requests   Final    NONE Performed at [facility], [address] [place], [place] [address]    Culture   Final    NO GROWTH Performed at [facility], [address], [place], [place] [address]    Report Status 04/26/2023 FINAL  Final    Studies/Results: No results found.  Assessment: Procedure(s): XI ROBOTIC ASSISTED LAPAROSCOPIC SUPRACERVICAL  HYSTERECTOMY AND BILATERAL SALPINGO-OOPHORECTOMY, MID-URETHRAL SLING XI ROBOTIC ASSISTED LAPAROSCOPIC SACROCOLPOPEXY CYSTOSCOPY with FIREFLY INJECTION, 2 Days Post-Op  doing well. Anemia from surgical blood loss  Plan: Follow up H and H  Ambulate  Re-eval this PM for discharge  Jerald Kief,  MD, PhD [facility] Resident  PGY4 [facility]    Berniece Salines, MD Urology 05/10/2023, 7:24 AM

## 2023-05-10 NOTE — Discharge Summary (Signed)
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 Briefly, Traci [name] is a 82 y.o. year old patient with symptomatic pelvic organ prolapse.   Hospital Course: Patient tolerated the procedure well.  She was then transferred to the floor after an uneventful PACU stay.  Her hospital course was uncomplicated.  On POD#2 she had met discharge criteria: was eating a regular diet, was up and ambulating independently with front wheel walker which she uses at baseline,  pain was well controlled, was voiding without a catheter, and was ready to for discharge. She did have some post operative hypotension which improved with fluids. Hg stable at the time of discharge.    Laboratory values:  Recent Labs    05/08/23 1429 05/09/23 0654 05/10/23 0741  HGB 10.4* 8.6* 7.0*  HCT 33.4* 27.5* 22.6*   Recent Labs    05/09/23 0529  NA 137  K 3.9  CL 106  CO2 24  GLUCOSE 113*  BUN 15  CREATININE 1.04*  CALCIUM 7.9*   No results for input(s): "LABPT", "INR" in the  last 72 hours. No results for input(s): "LABURIN" in the last 72 hours. Results for orders placed or performed during the hospital encounter of 04/25/23  Urine Culture     Status: None   Collection Time: 04/25/23  1:48 PM   Specimen: Urine, Clean Catch  Result Value Ref Range Status   Specimen Description   Final    URINE, CLEAN CATCH Performed at [facility], [address], [place], [place] [address]    Special Requests   Final    NONE Performed at [facility], [address], [place], [place] [address]    Culture   Final    NO GROWTH Performed at [facility], [address] [place], [place] [address]    Report Status 04/26/2023 FINAL  Final    Disposition: Home  Discharge instruction: The patient was instructed to be ambulatory but told to refrain from heavy lifting, strenuous activity, or driving.   Discharge medications:  Allergies as of 05/10/2023   No Known Allergies      Medication List     STOP taking these medications    cholecalciferol 1000 units tablet Commonly known as: VITAMIN D   lisinopril 20 MG tablet Commonly known as: ZESTRIL       TAKE these medications    amoxicillin 500 MG tablet Commonly known as: AMOXIL Take 4 tablets by mouth 1 hour before dental procedures and cleanings   aspirin EC 81 MG tablet Take 81 mg by mouth every evening. Swallow whole.   atorvastatin 20 MG tablet Commonly known as: LIPITOR Take 20 mg by mouth at bedtime.  docusate sodium 100 MG capsule Commonly known as: COLACE Take 1 capsule (100 mg total) by mouth 2 (two) times daily.   estradiol 0.1 MG/GM vaginal cream Commonly known as: ESTRACE Place 1 Applicatorful vaginally daily as needed (irritation).   pantoprazole 40 MG tablet Commonly known as: PROTONIX Take 40 mg by mouth every evening.   Soothe XP Soln Place 1 drop into both eyes daily as needed (dry eyes).   sulfamethoxazole-trimethoprim 800-160 MG  tablet Commonly known as: BACTRIM DS Take 1 tablet by mouth 2 (two) times daily.   traMADol 50 MG tablet Commonly known as: Ultram Take 1-2 tablets (50-100 mg total) by mouth every 6 (six) hours as needed for moderate pain or severe pain.        Followup:   Follow-up Information     Vanessa Barbara, NP Follow up on 05/22/2023.   Why: at 1:45 Contact information: [address] [place] [address] [phone]

## 2023-05-10 NOTE — Progress Notes (Signed)
Mobility Specialist - Progress Note   05/10/23 1159  Mobility  Activity Ambulated with assistance in hallway  Level of Assistance Contact guard assist, steadying assist  Assistive Device Front wheel walker  Distance Ambulated (ft) 50 ft  Range of Motion/Exercises Active Assistive  Activity Response Tolerated well  Mobility Referral Yes  $Mobility charge 1 Mobility  Mobility Specialist Start Time (ACUTE ONLY) 1147  Mobility Specialist Stop Time (ACUTE ONLY) 1159  Mobility Specialist Time Calculation (min) (ACUTE ONLY) 12 min   Pt was found on recliner chair and agreeable to practice stairs. Pt able to go up and down stairs without difficulty. Pt to recliner chair after session with needs met. Call bell in reach.  Billey Chang Mobility Specialist

## 2023-05-10 NOTE — Progress Notes (Signed)
Mobility Specialist - Progress Note   05/10/23 0951  Mobility  Activity Ambulated with assistance in hallway  Level of Assistance Standby assist, set-up cues, supervision of patient - no hands on  Assistive Device Front wheel walker  Distance Ambulated (ft) 200 ft  Range of Motion/Exercises Active  Activity Response Tolerated well  Mobility Referral Yes  $Mobility charge 1 Mobility  Mobility Specialist Start Time (ACUTE ONLY) [id number]  Mobility Specialist Stop Time (ACUTE ONLY) 0951  Mobility Specialist Time Calculation (min) (ACUTE ONLY) 13 min   Pt was found in bed and agreeable to ambulate. During session expressed concerns about going up and down stairs at home. Will try to practice later on prior to discharge. At EOS returned to recliner chair with needs met. Call bell in reach and RN notified.  Billey Chang Mobility Specialist

## 2023-05-14 NOTE — H&P (Signed)
cc: Hydronephrosis   82 year old Traci [name] who recently had a TAVR found to have mild bilateral hydronephrosis on CTA. Traci [name] has no flank pain. Traci Traci [name] is an 82 year old female who was fit with a size 5 Gehrung pessary. She reports that she does like not having to worry about her prolapse but she began having significant incontinence. She will leak on standing and moving. She will also leak occasionally at night. He is using Traci estrogen cream. She does endorse some slight dysuria. Her PVR is 0 mL.   Was fit with a size 5 Gehrung pessary. Since her fitting, she has had significant urinary leakage and reports that she has not felt very well. She does like that Traci pessary is in place and keeps her prolapse out of her way but she cannot tolerate Traci leakage. She presents today for pessary removal and a urinalysis.   12/11/22: failed pessary   Interval: Today Traci Traci [name] is here for follow-up and discussion of sacrocolpopexy. She has not been using Traci pessary because of her flooding incontinence. She denies any flank pain. She has not been treated for any infections recently, she was hospitalized in January. Traci Traci [name] has history of cholecystectomy but no other abdominal surgeries. She still has her uterus, fallopian tubes, and ovaries. She has not undergone urodynamics. She does have severe voiding symptoms.     ALLERGIES: No Known Allergies    MEDICATIONS: Aspirin 325 mg tablet  Estrace 0.01 % cream with applicator Insert 1 application daily for 2 weeks. Then use only 3 nights per week  Lisinopril 20 mg tablet  Amoxicillin  Atorvastatin Calcium 20 mg tablet  Pantoprazole Sodium 40 mg tablet, delayed release  Vitamin B12  Vitamin D3     GU PSH: Locm 300-399Mg /Ml Iodine,1Ml - 11/15/2022     NON-GU PSH: Remove Gallbladder     GU PMH: Complete uterovaginal prolapse - 02/27/2023, - 12/11/2022, - 11/30/2022, - 11/26/2022, - 11/22/2022, - 11/02/2022 Hydronephrosis - 02/27/2023, - 12/11/2022, -  11/26/2022, - 11/15/2022, - 11/02/2022 Mixed incontinence - 02/27/2023, - 12/11/2022 Cystocele, midline - 12/11/2022, - 11/30/2022, - 2017 Acute Cystitis/UTI - 11/30/2022 Nocturia - 2017 Rectocele - 2017 Urge incontinence - 2017    NON-GU PMH: Cardiac murmur, unspecified GERD Heart disease, unspecified Heartburn Hypercholesterolemia Hypertension    FAMILY HISTORY: 3 Son's - Son Death - Mother, Father   SOCIAL HISTORY: Marital Status: Widowed Preferred Language: English; Ethnicity: Not Hispanic Or Latino; Race: White Current Smoking Status: Traci [name] does not smoke anymore. Smoked for 10 years.  Drinks 1 caffeinated drink per day. Traci [name]'s occupation is/was retired.    REVIEW OF SYSTEMS:    GU Review Female:   Traci [name] denies frequent urination, hard to postpone urination, burning /pain with urination, get up at night to urinate, leakage of urine, stream starts and stops, trouble starting your stream, have to strain to urinate, and being pregnant.  Gastrointestinal (Upper):   Traci [name] denies nausea, vomiting, and indigestion/ heartburn.  Gastrointestinal (Lower):   Traci [name] denies diarrhea and constipation.  Constitutional:   Traci [name] denies fever, night sweats, weight loss, and fatigue.  Skin:   Traci [name] denies skin rash/ lesion and itching.  Eyes:   Traci [name] denies blurred vision and double vision.  Ears/ Nose/ Throat:   Traci [name] denies sore throat and sinus problems.  Hematologic/Lymphatic:   Traci [name] denies swollen glands and easy bruising.  Cardiovascular:   Traci [name] denies leg swelling and chest pains.  Respiratory:   Traci [name] denies cough and shortness of breath.  Endocrine:  Traci [name] denies excessive thirst.  Musculoskeletal:   Traci [name] denies back pain and joint pain.  Neurological:   Traci [name] denies headaches and dizziness.  Psychologic:   Traci [name] denies depression and anxiety.   VITAL SIGNS: None   Complexity of Data:  Source Of History:  Traci [name]  Records Review:   Previous Doctor  Records, Previous Hospital Records, Previous Traci [name] Records, POC Tool  Urine Test Review:   Urinalysis, Urine Culture  Urodynamics Review:   Review Bladder Scan   NAD Lungs CTA RRR Abdomen is soft Ext are symmetric  PROCEDURES:          Visit Complexity - G2211    ASSESSMENT:      ICD-10 Details  1 GU:   Complete uterovaginal prolapse - N81.3   2   Cystocele, midline - N81.11   3   Hydronephrosis - N13.0      PLAN:           Orders Labs Urine Culture          Document Letter(s):  Created for Traci [name]: Clinical Summary         Notes:   I went over robotic-assisted laparoscopic sacral colpopexy with Traci Traci [name] detail. I explained to Traci Traci [name] Traci rationale for Traci surgery. I also went over Traci placement of Traci laparoscopic ports. I detailed to her Traci surgery as well as Traci postoperative recovery time. I explained to Traci Traci [name] that she could expect to be in Traci hospital at least one or 2 nights. She will require 4 weeks of no heavy lifting, 6 weeks of no bending or twisting. She will not be able to use her vagina for 6 weeks. I discussed complications of Traci operation including injury to bowel, ureters, bladder. We also discussed Traci risk of failure as well as Traci complications of mesh. I explained to them Traci difference between transvaginal mesh and Traci mesh used for sacral colpopexy. I reassured them that there has not been an FDA warnings in regards to sacral colpopexy mesh. We will plan to get this prior to her surgery. I spent Traci minutes with Traci Traci [name] going over that ins and outs of Traci surgery and answering all her questions. During Traci procedure, I would like to also instill firefly and of Traci ureters to help identify them given her associated hydroureteronephrosis.   In addition, we will plan to do a supracervical hysterectomy with bilateral salpingo-oophorectomy. Further, given Traci Traci [name]'s severe stress incontinence she would benefit from a mid urethral sling  concomitantly. I spoke to her about all of this as well and she was eager to proceed.   CC: Michail Jewels, MD

## 2023-05-22 DIAGNOSIS — N8111 Cystocele, midline: Secondary | ICD-10-CM | POA: Diagnosis not present

## 2023-05-22 DIAGNOSIS — N813 Complete uterovaginal prolapse: Secondary | ICD-10-CM | POA: Diagnosis not present

## 2023-05-27 DIAGNOSIS — I129 Hypertensive chronic kidney disease with stage 1 through stage 4 chronic kidney disease, or unspecified chronic kidney disease: Secondary | ICD-10-CM | POA: Diagnosis not present

## 2023-05-27 DIAGNOSIS — E559 Vitamin D deficiency, unspecified: Secondary | ICD-10-CM | POA: Diagnosis not present

## 2023-05-27 DIAGNOSIS — N1831 Chronic kidney disease, stage 3a: Secondary | ICD-10-CM | POA: Diagnosis not present

## 2023-06-03 DIAGNOSIS — E559 Vitamin D deficiency, unspecified: Secondary | ICD-10-CM | POA: Diagnosis not present

## 2023-06-03 DIAGNOSIS — I350 Nonrheumatic aortic (valve) stenosis: Secondary | ICD-10-CM | POA: Diagnosis not present

## 2023-06-03 DIAGNOSIS — Z1339 Encounter for screening examination for other mental health and behavioral disorders: Secondary | ICD-10-CM | POA: Diagnosis not present

## 2023-06-03 DIAGNOSIS — I129 Hypertensive chronic kidney disease with stage 1 through stage 4 chronic kidney disease, or unspecified chronic kidney disease: Secondary | ICD-10-CM | POA: Diagnosis not present

## 2023-06-03 DIAGNOSIS — Z0000 Encounter for general adult medical examination without abnormal findings: Secondary | ICD-10-CM | POA: Diagnosis not present

## 2023-06-03 DIAGNOSIS — I10 Essential (primary) hypertension: Secondary | ICD-10-CM | POA: Diagnosis not present

## 2023-06-03 DIAGNOSIS — Z1331 Encounter for screening for depression: Secondary | ICD-10-CM | POA: Diagnosis not present

## 2023-06-03 DIAGNOSIS — N1831 Chronic kidney disease, stage 3a: Secondary | ICD-10-CM | POA: Diagnosis not present

## 2023-06-03 DIAGNOSIS — D638 Anemia in other chronic diseases classified elsewhere: Secondary | ICD-10-CM | POA: Diagnosis not present

## 2023-06-03 DIAGNOSIS — R82998 Other abnormal findings in urine: Secondary | ICD-10-CM | POA: Diagnosis not present

## 2023-06-25 DIAGNOSIS — N8111 Cystocele, midline: Secondary | ICD-10-CM | POA: Diagnosis not present

## 2023-09-02 DIAGNOSIS — L578 Other skin changes due to chronic exposure to nonionizing radiation: Secondary | ICD-10-CM | POA: Diagnosis not present

## 2023-09-02 DIAGNOSIS — L821 Other seborrheic keratosis: Secondary | ICD-10-CM | POA: Diagnosis not present

## 2023-09-02 DIAGNOSIS — L720 Epidermal cyst: Secondary | ICD-10-CM | POA: Diagnosis not present

## 2023-09-02 DIAGNOSIS — D485 Neoplasm of uncertain behavior of skin: Secondary | ICD-10-CM | POA: Diagnosis not present

## 2023-09-02 DIAGNOSIS — L570 Actinic keratosis: Secondary | ICD-10-CM | POA: Diagnosis not present

## 2023-09-09 ENCOUNTER — Ambulatory Visit: Payer: Medicare HMO | Attending: Cardiology | Admitting: Cardiology

## 2023-09-09 ENCOUNTER — Ambulatory Visit (HOSPITAL_COMMUNITY): Payer: Medicare HMO | Attending: Cardiology

## 2023-09-09 VITALS — BP 126/50 | HR 68 | Ht 67.0 in | Wt 190.8 lb

## 2023-09-09 DIAGNOSIS — I350 Nonrheumatic aortic (valve) stenosis: Secondary | ICD-10-CM

## 2023-09-09 DIAGNOSIS — Z952 Presence of prosthetic heart valve: Secondary | ICD-10-CM

## 2023-09-09 DIAGNOSIS — I10 Essential (primary) hypertension: Secondary | ICD-10-CM | POA: Diagnosis not present

## 2023-09-09 DIAGNOSIS — N1339 Other hydronephrosis: Secondary | ICD-10-CM | POA: Diagnosis not present

## 2023-09-09 LAB — ECHOCARDIOGRAM COMPLETE
AV Mean grad: 8 mm[Hg]
AV Peak grad: 12.7 mm[Hg]
Ao pk vel: 1.79 m/s
Area-P 1/2: 2.73 cm2
Height: 67 in
S' Lateral: 2.7 cm
Weight: 3052.8 [oz_av]

## 2023-09-09 MED ORDER — LISINOPRIL 20 MG PO TABS: 20.0000 mg | ORAL_TABLET | Freq: Every day | ORAL | 3 refills | Status: DC

## 2023-09-09 NOTE — Progress Notes (Signed)
[facility]   MULTIDISCIPLINARY HEART VALVE TEAM  Structural Heart Office Note:  .    Date:  09/09/2023  ID:  Traci [name], DOB [birth year], MRN [id number] PCP: Geoffry Paradise, MD  [facility] Providers Cardiologist:  Thurmon Fair, MD  History of Present Illness: .    Traci [name] is a 82 y.o. female with a hx of HTN, HLD and severe AS s/p TAVR who presents for 1 year follow up.    Traci [name] is followed by Dr. Royann Shivers for her cardiology care who has been following her mild to moderate aortic stenosis for quite some time. Recent echocardiogram showed progression of her aortic stenosis, gradient is moderate with a mean gradient of 29 mm and calculated aortic valve area of 1.2 cm. However because of progressive symptoms, she was referred for cardiac catheterization which demonstrated a higher transaortic gradient of 37 mmHg mean and no obstructive CAD. She began having progressive symptoms of fatigue and exertional dyspnea with most activities over the last 3-4 months without other explanation.    She was evaluated by the multidisciplinary valve team and felt to have severe, symptomatic aortic stenosis and to be a suitable candidate and is now s/p successful TAVR with a 26 mm Edwards Sapien 3 THV via the TF approach on 09/04/22. Post operative echo showed stable valve placement with 26mm S3UR, mean gradient at , EOA 3.30cm2, and DI 0.75. TAVR was c/b a right femoral artery dissection and she underwent successful CFA endarterectomy by Dr. Durwin Nora.     She continues to do very well since she was last seen with no chest pain, SOB, palpitations, LE edema, orthopnea, PND, dizziness, or syncope. Denies bleeding in stool or urine. Her breathing has been improved and she can now tolerate activities which used to be problematic. BP has been variable at times with SBP yesterday at 170. She took a lisinopril with improvement in HA symptoms and improved BP. Cr stable on  lab review.   Physical Exam:   VS:  BP (!) 126/50   Pulse 68   Ht 5\' 7"  (1.702 m)   Wt 190 lb 12.8 oz (86.5 kg)   SpO2 97%   BMI 29.88 kg/m    Wt Readings from Last 3 Encounters:  09/09/23 190 lb 12.8 oz (86.5 kg)  05/08/23 195 lb 15.8 oz (88.9 kg)  04/25/23 196 lb (88.9 kg)    General: Well developed, well nourished, NAD Lungs:Clear to ausculation bilaterally. No wheezes, rales, or rhonchi. Breathing is unlabored. Cardiovascular: RRR with S1 S2. No murmurs Extremities: No edema.  Neuro: Alert and oriented. No focal deficits. No facial asymmetry. MAE spontaneously. Psych: Responds to questions appropriately with normal affect.    ASSESSMENT AND PLAN: .    Severe AS: Continues to do well with NYHA class I symptoms s/p successful TAVR with a 26 mm Edwards Sapien 3 THV via the TF approach on 09/04/22. Echo today with normal LVEF at 60-65% with mild MR, and stable TAVR valve function with a mean gradient at and DI at 0.53cm2. There is trivial PVL. Noted to have a cystic structure in the liver measuring 6 x 4.7cm (noted on prior US/CT imaging>>>followed by urology). Continue ASA 81mg  every day and lifelong dental SBE. Plan regular follow up with Dr. Royann Shivers 01/2024.     HTN: BP noted to be variable between home and office readings. Previously taken off Lisinopril post TAVR however given an SBP at 170 earlier this week,  she took Lisinopril 20mg  with improvement in symptoms and BP. Will add Lisinopril back to regimen today. Keep BP log and if any s/s of presyncope or orthostasis, she will notify our team and will make adjustments at that time.    HLD: Continue statin.   Hydroureteronephrosis: Followed by urology.   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 Followed by urology/PCP.    Signed, Georgie Chard, NP

## 2023-09-09 NOTE — Patient Instructions (Signed)
Medication Instructions:  Please restart Lisinopril 20 mg a day. Continue all other medications as listed.  *If you need a refill on your cardiac medications before your next appointment, please call your pharmacy*   Follow-Up: At [facility], you and your health needs are our priority.  As part of our continuing mission to provide you with exceptional heart care, we have created designated Provider Care Teams.  These Care Teams include your primary Cardiologist (physician) and Advanced Practice Providers (APPs -  Physician Assistants and Nurse Practitioners) who all work together to provide you with the care you need, when you need it.  We recommend signing up for the patient portal called "MyChart".  Sign up information is provided on this After Visit Summary.  MyChart is used to connect with patients for Virtual Visits (Telemedicine).  Patients are able to view lab/test results, encounter notes, upcoming appointments, etc.  Non-urgent messages can be sent to your provider as well.   To learn more about what you can do with MyChart, go to [email or web address].    Your next appointment:   Follow up as scheduled.

## 2023-09-17 DIAGNOSIS — L720 Epidermal cyst: Secondary | ICD-10-CM | POA: Diagnosis not present

## 2023-12-09 DIAGNOSIS — E785 Hyperlipidemia, unspecified: Secondary | ICD-10-CM | POA: Diagnosis not present

## 2023-12-09 DIAGNOSIS — D638 Anemia in other chronic diseases classified elsewhere: Secondary | ICD-10-CM | POA: Diagnosis not present

## 2023-12-09 DIAGNOSIS — E279 Disorder of adrenal gland, unspecified: Secondary | ICD-10-CM | POA: Diagnosis not present

## 2023-12-09 DIAGNOSIS — I358 Other nonrheumatic aortic valve disorders: Secondary | ICD-10-CM | POA: Diagnosis not present

## 2023-12-09 DIAGNOSIS — I129 Hypertensive chronic kidney disease with stage 1 through stage 4 chronic kidney disease, or unspecified chronic kidney disease: Secondary | ICD-10-CM | POA: Diagnosis not present

## 2023-12-09 DIAGNOSIS — N814 Uterovaginal prolapse, unspecified: Secondary | ICD-10-CM | POA: Diagnosis not present

## 2023-12-09 DIAGNOSIS — Z23 Encounter for immunization: Secondary | ICD-10-CM | POA: Diagnosis not present

## 2023-12-12 ENCOUNTER — Other Ambulatory Visit: Payer: Self-pay

## 2023-12-12 DIAGNOSIS — I7777 Dissection of artery of lower extremity: Secondary | ICD-10-CM

## 2023-12-26 ENCOUNTER — Ambulatory Visit: Payer: Medicare HMO | Admitting: Physician Assistant

## 2023-12-26 ENCOUNTER — Ambulatory Visit (HOSPITAL_COMMUNITY)
Admission: RE | Admit: 2023-12-26 | Discharge: 2023-12-26 | Disposition: A | Discharge status: Home / Self Care | Payer: Medicare HMO | Source: Ambulatory Visit | Attending: Vascular Surgery | Admitting: Vascular Surgery

## 2023-12-26 VITALS — BP 146/82 | HR 68 | Temp 98.1°F | Ht 67.0 in | Wt 191.5 lb

## 2023-12-26 DIAGNOSIS — I7777 Dissection of artery of lower extremity: Secondary | ICD-10-CM | POA: Diagnosis not present

## 2023-12-26 LAB — VAS US ABI WITH/WO TBI
Left ABI: 1.15
Right ABI: 1.1

## 2023-12-26 NOTE — Progress Notes (Signed)
[facility] [place] HISTORY AND PHYSICAL   History of Present Illness:  [name] who presents for evaluation of bilateral LE.  She has a history of right common femoral artery dissection and acute occlusion s/p transcatheter aortic valve replacement on 09/04/2022.  She subsequently required right CFA endarterectomy with bovine patch angioplasty by Dr. Edilia Bo.   She is here for follow up and repeat ABI's.  She denies claudication, rest pain and non healing wounds.  She is not very active over all, but is at her baseline.    Past Medical History:  Diagnosis Date   Arthritis    Cancer (HCC)    basal cell x 2 on face.   GERD (gastroesophageal reflux disease)    High cholesterol    Hypertension    Irregular heart rate    Pneumonia    S/P TAVR (transcatheter aortic valve replacement) 09/04/2022   s/p TAVR with a 26mm Edwards S3UR via the TF approach by Dr. Excell Seltzer & Dr. Leafy Ro    Past Surgical History:  Procedure Laterality Date   APPLICATION OF WOUND VAC Right 09/04/2022   Procedure: APPLICATION OF WOUND VAC;  Surgeon: Chuck Hint, MD;  Location: [facility] OR;  Service: Vascular;  Laterality: Right;   CARDIAC CATHETERIZATION     CHOLECYSTECTOMY     DILATION AND CURETTAGE OF UTERUS     ENDARTERECTOMY FEMORAL Right 09/04/2022   Procedure: ENDARTERECTOMY FEMORAL;  Surgeon: Chuck Hint, MD;  Location: [facility] OR;  Service: Vascular;  Laterality: Right;   EYE SURGERY Bilateral    cataract extraction   GROIN DISSECTION Right 09/04/2022   Procedure: GROIN EXPLORATION;  Surgeon: Chuck Hint, MD;  Location: [facility] OR;  Service: Vascular;  Laterality: Right;   INTRAOPERATIVE TRANSTHORACIC ECHOCARDIOGRAM N/A 09/04/2022   Procedure: INTRAOPERATIVE TRANSTHORACIC ECHOCARDIOGRAM;  Surgeon: Tonny Bollman, MD;  Location: [facility] OR;  Service: Open Heart Surgery;  Laterality: N/A;   PATCH ANGIOPLASTY Right 09/04/2022   Procedure: BOVINE  PATCH ANGIOPLASTY;  Surgeon: Chuck Hint, MD;  Location: [facility] OR;  Service: Vascular;  Laterality: Right;   RIGHT/LEFT HEART CATH AND CORONARY ANGIOGRAPHY N/A 06/20/2022   Procedure: RIGHT/LEFT HEART CATH AND CORONARY ANGIOGRAPHY;  Surgeon: Kathleene Hazel, MD;  Location: [facility] INVASIVE CV LAB;  Service: Cardiovascular;  Laterality: N/A;   ROBOTIC ASSISTED LAPAROSCOPIC HYSTERECTOMY AND SALPINGECTOMY Bilateral 05/08/2023   Procedure: XI ROBOTIC ASSISTED LAPAROSCOPIC SUPRACERVICAL  HYSTERECTOMY AND BILATERAL SALPINGO-OOPHORECTOMY, MID-URETHRAL SLING;  Surgeon: Crist Fat, MD;  Location: [facility] ORS;  Service: Urology;  Laterality: Bilateral;  240 MINUTES   ROBOTIC ASSISTED LAPAROSCOPIC SACROCOLPOPEXY N/A 05/08/2023   Procedure: XI ROBOTIC ASSISTED LAPAROSCOPIC SACROCOLPOPEXY;  Surgeon: Crist Fat, MD;  Location: [facility] ORS;  Service: Urology;  Laterality: N/A;   TRANSCATHETER AORTIC VALVE REPLACEMENT, TRANSFEMORAL N/A 09/04/2022   Procedure: Transcatheter Aortic Valve Replacement, Transfemoral;  Surgeon: Tonny Bollman, MD;  Location: [facility] [place] OR;  Service: Open Heart Surgery;  Laterality: N/A;   TUBAL LIGATION     ULTRASOUND GUIDANCE FOR VASCULAR ACCESS Bilateral 09/04/2022   Procedure: ULTRASOUND GUIDANCE FOR VASCULAR ACCESS;  Surgeon: Tonny Bollman, MD;  Location: [facility] OR;  Service: Open Heart Surgery;  Laterality: Bilateral;    ROS:   General:  No weight loss, Fever, chills  HEENT: No recent headaches, no nasal bleeding, no visual changes, no sore throat  Neurologic: No dizziness, blackouts, seizures. No recent symptoms of stroke or mini- stroke. No recent episodes of slurred speech, or temporary  blindness.  Cardiac: No recent episodes of chest pain/pressure, no shortness of breath at rest.  No shortness of breath with exertion.  Denies history of atrial fibrillation or irregular heartbeat  Vascular: No history of rest pain in feet.  No history of claudication.  No history  of non-healing ulcer, No history of DVT   Pulmonary: No home oxygen, no productive cough, no hemoptysis,  No asthma or wheezing  Musculoskeletal:  [ ]  Arthritis, [ ]  Low back pain,  [ ]  Joint pain  Hematologic:No history of hypercoagulable state.  No history of easy bleeding.  No history of anemia  Gastrointestinal: No hematochezia or melena,  No gastroesophageal reflux, no trouble swallowing  Urinary: [ ]  chronic Kidney disease, [ ]  on HD - [ ]  MWF or [ ]  TTHS, [ ]  Burning with urination, [ ]  Frequent urination, [ ]  Difficulty urinating;   Skin: No rashes  Psychological: No history of anxiety,  No history of depression  Social History Social History   Tobacco Use   Smoking status: Never    Passive exposure: Never   Smokeless tobacco: Never  Vaping Use   Vaping status: Never Used  Substance Use Topics   Alcohol use: Yes    Comment: OCCASIONALLY wine per pt   Drug use: No    Family History Family History  Problem Relation Age of Onset   Heart disease Father    Colon cancer Neg Hx     Allergies  No Known Allergies   Current Outpatient Medications  Medication Sig Dispense Refill   amoxicillin (AMOXIL) 500 MG tablet Take 4 tablets by mouth 1 hour before dental procedures and cleanings 12 tablet 6   Artificial Tear Solution (SOOTHE XP) SOLN Place 1 drop into both eyes daily as needed (dry eyes).     aspirin EC 81 MG tablet Take 81 mg by mouth every evening. Swallow whole.     atorvastatin (LIPITOR) 20 MG tablet Take 20 mg by mouth at bedtime.     docusate sodium (COLACE) 100 MG capsule Take 1 capsule (100 mg total) by mouth 2 (two) times daily.     estradiol (ESTRACE) 0.1 MG/GM vaginal cream Place 1 Applicatorful vaginally daily as needed (irritation).     ferrous sulfate 325 (65 FE) MG EC tablet Take 1 tablet (325 mg total) by mouth 2 (two) times daily. 120 tablet 0   lisinopril (ZESTRIL) 20 MG tablet Take 1 tablet (20 mg total) by mouth daily. 90 tablet 3    pantoprazole (PROTONIX) 40 MG tablet Take 40 mg by mouth every evening.     sulfamethoxazole-trimethoprim (BACTRIM DS) 800-160 MG tablet Take 1 tablet by mouth 2 (two) times daily. 10 tablet 0   traMADol (ULTRAM) 50 MG tablet Take 1-2 tablets (50-100 mg total) by mouth every 6 (six) hours as needed for moderate pain or severe pain. 20 tablet 0   No current facility-administered medications for this visit.    Physical Examination  Vitals:   12/26/23 1519  BP: (!) 146/82  Pulse: 68  Temp: 98.1 F (36.7 C)  SpO2: 96%  Weight: 191 lb 8 oz (86.9 kg)  Height: 5\' 7"  (1.702 m)    Body mass index is 29.99 kg/m.  General:  Alert and oriented, no acute distress HEENT: Normal Neck: No bruit or JVD Pulmonary: Clear to auscultation bilaterally Cardiac: Regular Rate and Rhythm without murmur Abdomen: Soft, non-tender, non-distended, no mass, no scars Skin: No rash Extremity Pulses:   radial,  femoral, dorsalis  pedis pulses bilaterally Musculoskeletal: No deformity or edema  Neurologic: Upper and lower extremity motor 5/5 and symmetric  DATA:  ABI Findings:  +---------+------------------+-----+---------+--------+  Right   Rt Pressure (mmHg)IndexWaveform Comment   +---------+------------------+-----+---------+--------+  Brachial 152                                       +---------+------------------+-----+---------+--------+  ATA     147               0.97 triphasic          +---------+------------------+-----+---------+--------+  PTA     167               1.10 triphasic          +---------+------------------+-----+---------+--------+  Great Toe105               0.69                    +---------+------------------+-----+---------+--------+   +---------+------------------+-----+-----------+-------+  Left    Lt Pressure (mmHg)IndexWaveform   Comment  +---------+------------------+-----+-----------+-------+  Brachial 142                                         +---------+------------------+-----+-----------+-------+  ATA     175               1.15 multiphasic         +---------+------------------+-----+-----------+-------+  PTA     165               1.09 triphasic           +---------+------------------+-----+-----------+-------+  Great Toe102               0.67                     +---------+------------------+-----+-----------+-------+   +-------+-----------+-----------+------------+------------+  ABI/TBIToday's ABIToday's TBIPrevious ABIPrevious TBI  +-------+-----------+-----------+------------+------------+  Right 1.1        0.69       1.19        0.52          +-------+-----------+-----------+------------+------------+  Left  1.15       0.67       1.2         0.42          +-------+-----------+-----------+------------+------------+      Previous ABI on 12/24/22.    Summary:  Right: Resting right ankle-brachial index is within normal range. The  right toe-brachial index is abnormal.   Left: Resting left ankle-brachial index is within normal range. The left  toe-brachial index is abnormal.    ASSESSMENT/PLAN:  Traci [name] is a 83 y.o. female who presents for surveillance of PAD s/p right CFA endarterectomy She has multiphasic wave forms and excellent palpable pedal pulses.  She is asymptomatic for ischemia such as claudication, rest pain or non healing wounds.   She will continue ASA and Statin for daily medical management. Plan will be for her to f/u in 2 years with repeat ABI's. If she has concerns she will call our office.        Mosetta Pigeon PA-C [facility] [place] Office: [phone]  MD in clinic [place]

## 2023-12-30 DIAGNOSIS — N813 Complete uterovaginal prolapse: Secondary | ICD-10-CM | POA: Diagnosis not present

## 2023-12-30 DIAGNOSIS — N3946 Mixed incontinence: Secondary | ICD-10-CM | POA: Diagnosis not present

## 2024-02-05 ENCOUNTER — Encounter: Payer: Self-pay | Admitting: Cardiovascular Disease

## 2024-02-05 ENCOUNTER — Ambulatory Visit: Payer: Medicare HMO | Attending: Cardiovascular Disease | Admitting: Cardiovascular Disease

## 2024-02-05 VITALS — BP 128/64 | HR 65 | Ht 67.0 in | Wt 193.8 lb

## 2024-02-05 DIAGNOSIS — Z8679 Personal history of other diseases of the circulatory system: Secondary | ICD-10-CM

## 2024-02-05 DIAGNOSIS — I10 Essential (primary) hypertension: Secondary | ICD-10-CM | POA: Diagnosis not present

## 2024-02-05 DIAGNOSIS — Z952 Presence of prosthetic heart valve: Secondary | ICD-10-CM

## 2024-02-05 DIAGNOSIS — E7800 Pure hypercholesterolemia, unspecified: Secondary | ICD-10-CM | POA: Diagnosis not present

## 2024-02-05 NOTE — Progress Notes (Signed)
 Cardiology Office Note    Date:  02/05/2024   ID:  Traci, [name] Oct 19, 1941, MRN [id number]  PCP:  Geoffry Paradise, MD  Cardiologist:   Thurmon Fair, MD   No chief complaint on file.   History of Present Illness:  Traci [name] is a 83 y.o. female with a history of severe aortic stenosis status post TAVR Randa Evens SAPIEN 3, 26 mm 09/04/2022, complicated by dissection right femoral artery requiring surgical endarterectomy and groin hematoma), hyperlipidemia and hypertension.  She has had 2 brief episodes of paroxysmal atrial fibrillation in 2012 (after laparoscopic cholecystectomy when she was also hypokalemic) and 2024 (acute pyelonephritis) and a single episode of vasovagal syncope (about 4 years ago).  She has had successful corrective surgery for uterine prolapse and recurrent urinary tract infections.   She continues to live independently, taking care of her own household chores and still drives to [place].  She specifically denies any chest pain at rest or with exertion, dyspnea at rest or with exertion, orthopnea, paroxysmal nocturnal dyspnea, syncope, palpitations, focal neurological deficits, intermittent claudication, lower extremity edema, unexplained weight gain, cough, hemoptysis or wheezing.     Past Medical History:  Diagnosis Date   Arthritis    Cancer (HCC)    basal cell x 2 on face.   GERD (gastroesophageal reflux disease)    High cholesterol    Hypertension    Irregular heart rate    Pneumonia    S/P TAVR (transcatheter aortic valve replacement) 09/04/2022   s/p TAVR with a 26mm Edwards S3UR via the TF approach by Dr. Excell Seltzer & Dr. Leafy Ro    Past Surgical History:  Procedure Laterality Date   APPLICATION OF WOUND VAC Right 09/04/2022   Procedure: APPLICATION OF WOUND VAC;  Surgeon: Chuck Hint, MD;  Location: [facility] OR;  Service: Vascular;  Laterality: Right;   CARDIAC CATHETERIZATION      CHOLECYSTECTOMY     DILATION AND CURETTAGE OF UTERUS     ENDARTERECTOMY FEMORAL Right 09/04/2022   Procedure: ENDARTERECTOMY FEMORAL;  Surgeon: Chuck Hint, MD;  Location: [facility] OR;  Service: Vascular;  Laterality: Right;   EYE SURGERY Bilateral    cataract extraction   GROIN DISSECTION Right 09/04/2022   Procedure: GROIN EXPLORATION;  Surgeon: Chuck Hint, MD;  Location: [facility] OR;  Service: Vascular;  Laterality: Right;   INTRAOPERATIVE TRANSTHORACIC ECHOCARDIOGRAM N/A 09/04/2022   Procedure: INTRAOPERATIVE TRANSTHORACIC ECHOCARDIOGRAM;  Surgeon: Tonny Bollman, MD;  Location: [facility] OR;  Service: Open Heart Surgery;  Laterality: N/A;   PATCH ANGIOPLASTY Right 09/04/2022   Procedure: BOVINE PATCH ANGIOPLASTY;  Surgeon: Chuck Hint, MD;  Location: [facility] OR;  Service: Vascular;  Laterality: Right;   RIGHT/LEFT HEART CATH AND CORONARY ANGIOGRAPHY N/A 06/20/2022   Procedure: RIGHT/LEFT HEART CATH AND CORONARY ANGIOGRAPHY;  Surgeon: Kathleene Hazel, MD;  Location: [facility] INVASIVE CV LAB;  Service: Cardiovascular;  Laterality: N/A;   ROBOTIC ASSISTED LAPAROSCOPIC HYSTERECTOMY AND SALPINGECTOMY Bilateral 05/08/2023   Procedure: XI ROBOTIC ASSISTED LAPAROSCOPIC SUPRACERVICAL  HYSTERECTOMY AND BILATERAL SALPINGO-OOPHORECTOMY, MID-URETHRAL SLING;  Surgeon: Crist Fat, MD;  Location: [facility] ORS;  Service: Urology;  Laterality: Bilateral;  240 MINUTES   ROBOTIC ASSISTED LAPAROSCOPIC SACROCOLPOPEXY N/A 05/08/2023   Procedure: XI ROBOTIC ASSISTED LAPAROSCOPIC SACROCOLPOPEXY;  Surgeon: Crist Fat, MD;  Location: [facility] ORS;  Service: Urology;  Laterality: N/A;   TRANSCATHETER AORTIC VALVE REPLACEMENT, TRANSFEMORAL N/A 09/04/2022   Procedure: Transcatheter Aortic Valve Replacement, Transfemoral;  Surgeon:  Tonny Bollman, MD;  Location: [facility] OR;  Service: Open Heart Surgery;  Laterality: N/A;   TUBAL LIGATION     ULTRASOUND GUIDANCE FOR VASCULAR ACCESS Bilateral 09/04/2022    Procedure: ULTRASOUND GUIDANCE FOR VASCULAR ACCESS;  Surgeon: Tonny Bollman, MD;  Location: [facility] OR;  Service: Open Heart Surgery;  Laterality: Bilateral;    Current Medications: Outpatient Medications Prior to Visit  Medication Sig Dispense Refill   Artificial Tear Solution (SOOTHE XP) SOLN Place 1 drop into both eyes daily as needed (dry eyes).     aspirin EC 81 MG tablet Take 81 mg by mouth every evening. Swallow whole.     atorvastatin (LIPITOR) 20 MG tablet Take 20 mg by mouth at bedtime.     estradiol (ESTRACE) 0.1 MG/GM vaginal cream Place 1 Applicatorful vaginally daily as needed (irritation).     pantoprazole (PROTONIX) 40 MG tablet Take 40 mg by mouth every evening.     amoxicillin (AMOXIL) 500 MG tablet Take 4 tablets by mouth 1 hour before dental procedures and cleanings (Patient not taking: Reported on 02/05/2024) 12 tablet 6   docusate sodium (COLACE) 100 MG capsule Take 1 capsule (100 mg total) by mouth 2 (two) times daily.     Docusate Sodium (DSS) 100 MG CAPS Take 100 mg by mouth as needed. (Patient not taking: Reported on 02/05/2024)     ferrous sulfate 325 (65 FE) MG EC tablet Take 1 tablet (325 mg total) by mouth 2 (two) times daily. (Patient not taking: Reported on 02/05/2024) 120 tablet 0   lisinopril (ZESTRIL) 20 MG tablet Take 1 tablet (20 mg total) by mouth daily. (Patient not taking: Reported on 02/05/2024) 90 tablet 3   sulfamethoxazole-trimethoprim (BACTRIM DS) 800-160 MG tablet Take 1 tablet by mouth 2 (two) times daily. (Patient not taking: Reported on 02/05/2024) 10 tablet 0   traMADol (ULTRAM) 50 MG tablet Take 1-2 tablets (50-100 mg total) by mouth every 6 (six) hours as needed for moderate pain or severe pain. 20 tablet 0   No facility-administered medications prior to visit.     Allergies:   Patient has no known allergies.  Family History:  The patient's family history includes Heart disease in her father.    PHYSICAL EXAM:   VS:  BP 128/64 (BP Location: Left  Arm, Patient Position: Sitting)   Pulse 65   Ht 5\' 7"  (1.702 m)   Wt 193 lb 12.8 oz (87.9 kg)   SpO2 98%   BMI 30.35 kg/m      General: Alert, oriented x3, no distress, borderline obese Head: no evidence of trauma, PERRL, EOMI, no exophtalmos or lid lag, no myxedema, no xanthelasma; normal ears, nose and oropharynx Neck: normal jugular venous pulsations and no hepatojugular reflux; brisk carotid pulses without delay and no carotid bruits Chest: clear to auscultation, no signs of consolidation by percussion or palpation, normal fremitus, symmetrical and full respiratory excursions Cardiovascular: normal position and quality of the apical impulse, regular rhythm, normal first and second heart sounds, barely audible aortic ejection murmur, no diastolic murmurs, rubs or gallops Abdomen: no tenderness or distention, no masses by palpation, no abnormal pulsatility or arterial bruits, normal bowel sounds, no hepatosplenomegaly Extremities: no clubbing, cyanosis or edema; 2+ radial, ulnar and brachial pulses bilaterally; 2+ right femoral, posterior tibial and dorsalis pedis pulses; 2+ left femoral, posterior tibial and dorsalis pedis pulses; no subclavian or femoral bruits Neurological: grossly nonfocal Psych: Normal mood and affect    Wt Readings from Last 3 Encounters:  02/05/24 193 lb 12.8 oz (87.9 kg)  12/26/23 191 lb 8 oz (86.9 kg)  09/09/23 190 lb 12.8 oz (86.5 kg)      Studies/Labs Reviewed:    EKG Interpretation Date/Time:  Wednesday February 05 2024 14:29:35 EDT Ventricular Rate:  65 PR Interval:  176 QRS Duration:  82 QT Interval:  410 QTC Calculation: 426 R Axis:   17  Text Interpretation: Normal sinus rhythm Normal ECG When compared with ECG of 12-Dec-2022 17:11, PREVIOUS ECG IS PRESENT Confirmed by Roshawn Ayala ([id number]) on 02/05/2024 2:34:06 PM        Echocardiogram 09/09/2023:    1. Left ventricular ejection fraction, by estimation, is 60 to 65%. The  left  ventricle has normal function. The left ventricle has no regional  wall motion abnormalities. Left ventricular diastolic parameters are  consistent with Grade I diastolic  dysfunction (impaired relaxation).   2. Right ventricular systolic function is normal. The right ventricular  size is normal. Tricuspid regurgitation signal is inadequate for assessing  PA pressure.   3. The mitral valve is normal in structure. Mild mitral valve  regurgitation. No evidence of mitral stenosis.   4. Bioprosthetic aortic valve s/p TAVR wtih 26 mm Edwards Sapien 3 THV.  Trivial peri-valvular leakage. Mean gradient 8 mmHg, DI 0.53.   5. The inferior vena cava is normal in size with greater than 50%  respiratory variability, suggesting right atrial pressure of 3 mmHg.   6. 6 x 4.7 cm cystic structure noted in liver.    Cardiac catheterization 06/20/2022 :    Prox RCA to Mid RCA lesion is 10% stenosed.   Prox Cx to Mid Cx lesion is 10% stenosed.   Ost LAD to Mid LAD lesion is 10% stenosed.   Mild non-obstructive CAD Moderately severe to severe aortic stenosis. Peak to peak gradient 37.3 mmHg, mean gradient 36.7 mmHg.  Normal right and left heart pressures.  Normal cardiac output   Recommendations: Will review with Dr. Royann Shivers. Given the fact that she is symptomatic and has AS that is bordering on severe, will decide between Progress trial and proceeding with TAVR.   Fick Cardiac Output 7.97 L/min  Fick Cardiac Output Index 3.87 (L/min)/BSA  Aortic Mean Gradient 36.65 mmHg  Aortic Peak Gradient 37.3 mmHg  Aortic Valve Area 1.21  Aortic Value Area Index 0.59 cm2/BSA  RA A Wave 8 mmHg  RA V Wave 5 mmHg  RA Mean 2 mmHg  RV Systolic Pressure 37 mmHg  RV Diastolic Pressure 1 mmHg  RV EDP 8 mmHg  PA Systolic Pressure 37 mmHg  PA Diastolic Pressure 10 mmHg  PA Mean 19 mmHg  PW A Wave 10 mmHg  PW V Wave 11 mmHg  PW Mean 10 mmHg  AO Systolic Pressure 134 mmHg  AO Diastolic Pressure 60 mmHg  AO Mean  92 mmHg  LV Systolic Pressure 194 mmHg  LV Diastolic Pressure 3 mmHg  LV EDP 17 mmHg  AOp Systolic Pressure 147 mmHg  AOp Diastolic Pressure 61 mmHg  AOp Mean Pressure 98 mmHg  LVp Systolic Pressure 172 mmHg  LVp Diastolic Pressure 14 mmHg  LVp EDP Pressure 15 mmHg  QP/QS 1  TPVR Index 4.91 HRUI  TSVR Index 23.77 HRUI  PVR SVR Ratio 0.1  TPVR/TSVR Ratio 0.21    EKG:  EKG is not ordered today.  The tracing from 12/12/2022 showed atrial fibrillation with RVR and relatively minor nonspecific ST segment changes, rate related. Recent Labs: 04/15/2019 Total cholesterol 161, HDL 60,  LDL 90, triglycerides 57 Hemoglobin 12.5, creatinine 1.0, normal TSH  04/26/2021 Cholesterol 173, HDL 75, LDL 81, triglycerides 87  40/98/1191 Cholesterol 172, HDL 65, LDL 88, triglycerides 94 Potassium 4.5, ALT 12  05/27/2023 Cholesterol 191, HDL 58, LDL 117, triglycerides 82 Potassium 4.6, creatinine 1.04, ALT 8, TSH 12.23   ASSESSMENT:    1. S/P TAVR (transcatheter aortic valve replacement)   2. History of carotid sinus syncope   3. History of atrial fibrillation   4. Essential hypertension   5. Pure hypercholesterolemia       PLAN:  In order of problems listed above:  AS s/p TAVR: Asymptomatic.  Good hemodynamic parameters on echo.  Reviewed the importance of endocarditis prophylaxis before dental procedures that involve bleeding at the gumline. Syncope:  she had a single episode of syncope that sounded vasovagal in its pattern and has not recurred in about 4 years. AFib: She has 2 clear episodes of atrial fibrillation that occurred more than 10 years apart, each time in the setting of acute illness (cholecystectomy and pyelonephritis respectively).  She is asymptomatic between these events and previous arrhythmia monitors have not shown any recurrence of atrial fibrillation.  She does not have palpitations.  Both age are normal in size and now that her aortic stenosis is corrected, she  does not have any significant structural heart disease.  CHA2DS2-VASc score is 37 (age 50, gender, hypertension), but there is no history of stroke or TIA.  The likelihood of arrhythmia recurrence is felt to be low.  For now we will keep off anticoagulation, but she is really quested to call us if she has sustained palpitations or any focal neurological complaints, even if temporary and brief. HTN: Very well-controlled , continue same medications. HLP: On statin.  Until this last check, LDL was consistently less than 100.  Last year LDL was 117.  Will have repeat labs in July with Dr. Jacky Kindle.  Weight loss encouraged.     Medication Adjustments/Labs and Tests Ordered: Current medicines are reviewed at length with the patient today.  Concerns regarding medicines are outlined above.  Medication changes, Labs and Tests ordered today are listed in the Patient Instructions below. Patient Instructions  Medication Instructions:  No changes *If you need a refill on your cardiac medications before your next appointment, please call your pharmacy*  Follow-Up: At [facility], you and your health needs are our priority.  As part of our continuing mission to provide you with exceptional heart care, we have created designated Provider Care Teams.  These Care Teams include your primary Cardiologist (physician) and Advanced Practice Providers (APPs -  Physician Assistants and Nurse Practitioners) who all work together to provide you with the care you need, when you need it.  We recommend signing up for the patient portal called "MyChart".  Sign up information is provided on this After Visit Summary.  MyChart is used to connect with patients for Virtual Visits (Telemedicine).  Patients are able to view lab/test results, encounter notes, upcoming appointments, etc.  Non-urgent messages can be sent to your provider as well.   To learn more about what you can do with MyChart, go to [email or web address].     Your next appointment:   1 year(s)  Provider:   Thurmon Fair, MD             Signed, Thurmon Fair, MD  02/05/2024 2:52 PM    [facility] [address] [place], [place], [place]  [address] Phone: [phone])  [phone]; Fax: [phone]

## 2024-02-05 NOTE — Patient Instructions (Signed)

## 2024-06-01 DIAGNOSIS — N1831 Chronic kidney disease, stage 3a: Secondary | ICD-10-CM | POA: Diagnosis not present

## 2024-06-01 DIAGNOSIS — D638 Anemia in other chronic diseases classified elsewhere: Secondary | ICD-10-CM | POA: Diagnosis not present

## 2024-06-01 DIAGNOSIS — E785 Hyperlipidemia, unspecified: Secondary | ICD-10-CM | POA: Diagnosis not present

## 2024-06-01 DIAGNOSIS — E559 Vitamin D deficiency, unspecified: Secondary | ICD-10-CM | POA: Diagnosis not present

## 2024-06-01 DIAGNOSIS — I129 Hypertensive chronic kidney disease with stage 1 through stage 4 chronic kidney disease, or unspecified chronic kidney disease: Secondary | ICD-10-CM | POA: Diagnosis not present

## 2024-06-01 DIAGNOSIS — D649 Anemia, unspecified: Secondary | ICD-10-CM | POA: Diagnosis not present

## 2024-06-15 DIAGNOSIS — R82998 Other abnormal findings in urine: Secondary | ICD-10-CM | POA: Diagnosis not present

## 2024-06-15 DIAGNOSIS — Z1339 Encounter for screening examination for other mental health and behavioral disorders: Secondary | ICD-10-CM | POA: Diagnosis not present

## 2024-06-15 DIAGNOSIS — R252 Cramp and spasm: Secondary | ICD-10-CM | POA: Diagnosis not present

## 2024-06-15 DIAGNOSIS — N1832 Chronic kidney disease, stage 3b: Secondary | ICD-10-CM | POA: Diagnosis not present

## 2024-06-15 DIAGNOSIS — I10 Essential (primary) hypertension: Secondary | ICD-10-CM | POA: Diagnosis not present

## 2024-06-15 DIAGNOSIS — M5416 Radiculopathy, lumbar region: Secondary | ICD-10-CM | POA: Diagnosis not present

## 2024-06-15 DIAGNOSIS — Z0000 Encounter for general adult medical examination without abnormal findings: Secondary | ICD-10-CM | POA: Diagnosis not present

## 2024-06-15 DIAGNOSIS — M1990 Unspecified osteoarthritis, unspecified site: Secondary | ICD-10-CM | POA: Diagnosis not present

## 2024-06-15 DIAGNOSIS — I700 Atherosclerosis of aorta: Secondary | ICD-10-CM | POA: Diagnosis not present

## 2024-06-15 DIAGNOSIS — Z1331 Encounter for screening for depression: Secondary | ICD-10-CM | POA: Diagnosis not present

## 2024-09-21 DIAGNOSIS — R942 Abnormal results of pulmonary function studies: Secondary | ICD-10-CM | POA: Diagnosis not present

## 2024-09-21 DIAGNOSIS — M545 Low back pain, unspecified: Secondary | ICD-10-CM | POA: Diagnosis not present

## 2024-09-21 DIAGNOSIS — Z87891 Personal history of nicotine dependence: Secondary | ICD-10-CM | POA: Diagnosis not present

## 2024-09-21 DIAGNOSIS — R079 Chest pain, unspecified: Secondary | ICD-10-CM | POA: Diagnosis not present

## 2024-09-21 DIAGNOSIS — R55 Syncope and collapse: Secondary | ICD-10-CM | POA: Diagnosis not present

## 2024-09-21 DIAGNOSIS — Z743 Need for continuous supervision: Secondary | ICD-10-CM | POA: Diagnosis not present

## 2024-09-29 DIAGNOSIS — R55 Syncope and collapse: Secondary | ICD-10-CM | POA: Diagnosis not present

## 2024-09-29 DIAGNOSIS — I700 Atherosclerosis of aorta: Secondary | ICD-10-CM | POA: Diagnosis not present

## 2024-09-29 DIAGNOSIS — M5416 Radiculopathy, lumbar region: Secondary | ICD-10-CM | POA: Diagnosis not present

## 2024-09-29 DIAGNOSIS — R911 Solitary pulmonary nodule: Secondary | ICD-10-CM | POA: Diagnosis not present

## 2024-09-29 DIAGNOSIS — N1832 Chronic kidney disease, stage 3b: Secondary | ICD-10-CM | POA: Diagnosis not present

## 2024-09-29 DIAGNOSIS — I129 Hypertensive chronic kidney disease with stage 1 through stage 4 chronic kidney disease, or unspecified chronic kidney disease: Secondary | ICD-10-CM | POA: Diagnosis not present

## 2024-09-29 DIAGNOSIS — E279 Disorder of adrenal gland, unspecified: Secondary | ICD-10-CM | POA: Diagnosis not present

## 2024-10-12 ENCOUNTER — Ambulatory Visit: Attending: Cardiovascular Disease | Admitting: Cardiovascular Disease

## 2024-10-12 ENCOUNTER — Encounter: Payer: Self-pay | Admitting: Cardiovascular Disease

## 2024-10-12 VITALS — BP 132/56 | HR 87 | Ht 67.0 in | Wt 196.0 lb

## 2024-10-12 DIAGNOSIS — Z952 Presence of prosthetic heart valve: Secondary | ICD-10-CM

## 2024-10-12 DIAGNOSIS — Z8679 Personal history of other diseases of the circulatory system: Secondary | ICD-10-CM

## 2024-10-12 DIAGNOSIS — I480 Paroxysmal atrial fibrillation: Secondary | ICD-10-CM

## 2024-10-12 DIAGNOSIS — E7800 Pure hypercholesterolemia, unspecified: Secondary | ICD-10-CM | POA: Diagnosis not present

## 2024-10-12 DIAGNOSIS — R55 Syncope and collapse: Secondary | ICD-10-CM | POA: Diagnosis not present

## 2024-10-12 DIAGNOSIS — I10 Essential (primary) hypertension: Secondary | ICD-10-CM

## 2024-10-12 NOTE — Patient Instructions (Signed)
 Medication Instructions:  No changes *If you need a refill on your cardiac medications before your next appointment, please call your pharmacy*  Lab Work: None ordered If you have labs (blood work) drawn today and your tests are completely normal, you will receive your results only by: MyChart Message (if you have MyChart) OR A paper copy in the mail If you have any lab test that is abnormal or we need to change your treatment, we will call you to review the results.  Testing/Procedures: Your physician has requested that you have an echocardiogram. Echocardiography is a painless test that uses sound waves to create images of your heart. It provides your doctor with information about the size and shape of your heart and how well your heart's chambers and valves are working. This procedure takes approximately one hour. There are no restrictions for this procedure. Please do NOT wear cologne, perfume, aftershave, or lotions (deodorant is allowed). Please arrive 15 minutes prior to your appointment time.  Please note: We ask at that you not bring children with you during ultrasound (echo/ vascular) testing. Due to room size and safety concerns, children are not allowed in the ultrasound rooms during exams. Our front office staff cannot provide observation of children in our lobby area while testing is being conducted. An adult accompanying a patient to their appointment will only be allowed in the ultrasound room at the discretion of the ultrasound technician under special circumstances. We apologize for any inconvenience.   Follow-Up: At [facility], you and your health needs are our priority.  As part of our continuing mission to provide you with exceptional heart care, our providers are all part of one team.  This team includes your primary Cardiologist (physician) and Advanced Practice Providers or APPs (Physician Assistants and Nurse Practitioners) who all work together to provide you  with the care you need, when you need it.  Your next appointment:   1 year(s)  Provider:   Jerel Balding, MD    We recommend signing up for the patient portal called MyChart.  Sign up information is provided on this After Visit Summary.  MyChart is used to connect with patients for Virtual Visits (Telemedicine).  Patients are able to view lab/test results, encounter notes, upcoming appointments, etc.  Non-urgent messages can be sent to your provider as well.   To learn more about what you can do with MyChart, go to [email or web address].

## 2024-10-12 NOTE — Progress Notes (Unsigned)
 Cardiology Office Note    Date:  10/14/2024   ID:  Traci [name], DOB February 17, 1941, MRN [id number]  PCP:  Shepard Ade, MD  Cardiologist:   Jerel Balding, MD   No chief complaint on file.   History of Present Illness:  Traci [name] is a 83 y.o. female with a history of severe aortic stenosis status post TAVR Robertha SAPIEN 3, 26 mm 09/04/2022, complicated by dissection right femoral artery requiring surgical endarterectomy and groin hematoma), hyperlipidemia and hypertension.  She has had 2 brief episodes of paroxysmal atrial fibrillation in 2012 (after laparoscopic cholecystectomy when she was also hypokalemic) and 2024 (acute pyelonephritis) and a previous episode of vasovagal syncope (about 4 years ago).  She has had successful corrective surgery for uterine prolapse and recurrent urinary tract infections.   About 1 month ago, she was with her son in [place], [place] in a grocery store, where she suddenly experienced very severe back pain.  She felt that she might pass out.  She looked for a place to sit down, but was unable to find one.  She did not fully lose consciousness but was lowered gently to the ground by bystanders.  Reportedly her initial blood pressure taken by the grocery store staff was very high, but by the time EMS arrived her blood pressure was normal.  She was taken to the emergency room in [place], [place].  Her workup included a CT of the chest abdomen and pelvis which is described as normal (she has a previously described incidentally discovered adrenal adenoma).  She has had previous problems with low back pain.  She was told that she probably had a vagal episode.  He has not had any issues with chest pain or shortness of breath.  She continues to live independently.  She specifically denies any chest pain at rest or with exertion, dyspnea at rest or with exertion, orthopnea, paroxysmal nocturnal dyspnea, palpitations, focal neurological deficits, intermittent  claudication, lower extremity edema, unexplained weight gain, cough, hemoptysis or wheezing.  Her echocardiogram performed roughly 1 year ago shows normal left ventricular systolic function and a stable mean gradient across the aortic valve prosthesis of 8 mmHg.  There was trivial paravalvular leak.  She did not have any meaningful coronary problems at pre-TAVR cardiac catheterization in 2023.   Past Medical History:  Diagnosis Date   Arthritis    Cancer (HCC)    basal cell x 2 on face.   GERD (gastroesophageal reflux disease)    High cholesterol    Hypertension    Irregular heart rate    Pneumonia    S/P TAVR (transcatheter aortic valve replacement) 09/04/2022   s/p TAVR with a 26mm Edwards S3UR via the TF approach by Dr. Wonda & Dr. Maryjane    Past Surgical History:  Procedure Laterality Date   APPLICATION OF WOUND VAC Right 09/04/2022   Procedure: APPLICATION OF WOUND VAC;  Surgeon: Eliza Lonni RAMAN, MD;  Location: [facility] OR;  Service: Vascular;  Laterality: Right;   CARDIAC CATHETERIZATION     CHOLECYSTECTOMY     DILATION AND CURETTAGE OF UTERUS     ENDARTERECTOMY FEMORAL Right 09/04/2022   Procedure: ENDARTERECTOMY FEMORAL;  Surgeon: Eliza Lonni RAMAN, MD;  Location: [facility] OR;  Service: Vascular;  Laterality: Right;   EYE SURGERY Bilateral    cataract extraction   GROIN DISSECTION Right 09/04/2022   Procedure: GROIN EXPLORATION;  Surgeon: Eliza Lonni RAMAN, MD;  Location: [facility] OR;  Service: Vascular;  Laterality: Right;  INTRAOPERATIVE TRANSTHORACIC ECHOCARDIOGRAM N/A 09/04/2022   Procedure: INTRAOPERATIVE TRANSTHORACIC ECHOCARDIOGRAM;  Surgeon: Wonda Sharper, MD;  Location: [facility] OR;  Service: Open Heart Surgery;  Laterality: N/A;   PATCH ANGIOPLASTY Right 09/04/2022   Procedure: BOVINE PATCH ANGIOPLASTY;  Surgeon: Eliza Lonni RAMAN, MD;  Location: [facility] OR;  Service: Vascular;  Laterality: Right;   RIGHT/LEFT HEART CATH AND CORONARY ANGIOGRAPHY N/A 06/20/2022    Procedure: RIGHT/LEFT HEART CATH AND CORONARY ANGIOGRAPHY;  Surgeon: Verlin Lonni BIRCH, MD;  Location: [facility] INVASIVE CV LAB;  Service: Cardiovascular;  Laterality: N/A;   ROBOTIC ASSISTED LAPAROSCOPIC HYSTERECTOMY AND SALPINGECTOMY Bilateral 05/08/2023   Procedure: XI ROBOTIC ASSISTED LAPAROSCOPIC SUPRACERVICAL  HYSTERECTOMY AND BILATERAL SALPINGO-OOPHORECTOMY, MID-URETHRAL SLING;  Surgeon: Cam Morene ORN, MD;  Location: [facility] ORS;  Service: Urology;  Laterality: Bilateral;  240 MINUTES   ROBOTIC ASSISTED LAPAROSCOPIC SACROCOLPOPEXY N/A 05/08/2023   Procedure: XI ROBOTIC ASSISTED LAPAROSCOPIC SACROCOLPOPEXY;  Surgeon: Cam Morene ORN, MD;  Location: [facility] ORS;  Service: Urology;  Laterality: N/A;   TRANSCATHETER AORTIC VALVE REPLACEMENT, TRANSFEMORAL N/A 09/04/2022   Procedure: Transcatheter Aortic Valve Replacement, Transfemoral;  Surgeon: Wonda Sharper, MD;  Location: [facility] OR;  Service: Open Heart Surgery;  Laterality: N/A;   TUBAL LIGATION     ULTRASOUND GUIDANCE FOR VASCULAR ACCESS Bilateral 09/04/2022   Procedure: ULTRASOUND GUIDANCE FOR VASCULAR ACCESS;  Surgeon: Wonda Sharper, MD;  Location: [facility] OR;  Service: Open Heart Surgery;  Laterality: Bilateral;    Current Medications: Outpatient Medications Prior to Visit  Medication Sig Dispense Refill   Artificial Tear Solution (SOOTHE XP) SOLN Place 1 drop into both eyes daily as needed (dry eyes).     aspirin  EC 81 MG tablet Take 81 mg by mouth every evening. Swallow whole.     atorvastatin  (LIPITOR) 20 MG tablet Take 20 mg by mouth at bedtime.     pantoprazole  (PROTONIX ) 40 MG tablet Take 40 mg by mouth every evening.     polyethylene glycol powder (MIRALAX ) 17 GM/SCOOP powder Take 17 g by mouth as needed.     amoxicillin  (AMOXIL ) 500 MG tablet Take 4 tablets by mouth 1 hour before dental procedures and cleanings (Patient not taking: Reported on 10/12/2024) 12 tablet 6   Docusate Sodium  (DSS) 100 MG CAPS Take 100 mg by mouth as needed.  (Patient not taking: Reported on 10/12/2024)     estradiol  (ESTRACE ) 0.1 MG/GM vaginal cream Place 1 Applicatorful vaginally daily as needed (irritation). (Patient not taking: Reported on 10/12/2024)     lisinopril  (ZESTRIL ) 20 MG tablet Take 1 tablet (20 mg total) by mouth daily. (Patient not taking: Reported on 02/05/2024) 90 tablet 3   No facility-administered medications prior to visit.     Allergies:   Patient has no known allergies.  Family History:  The patient's family history includes Heart disease in her father.    PHYSICAL EXAM:   VS:  BP (!) 132/56 (BP Location: Left Arm, Patient Position: Sitting, Cuff Size: Normal)   Pulse 87   Ht 5' 7 (1.702 m)   Wt 196 lb (88.9 kg)   SpO2 98%   BMI 30.70 kg/m      General: Alert, oriented x3, no distress, borderline obese Head: no evidence of trauma, PERRL, EOMI, no exophtalmos or lid lag, no myxedema, no xanthelasma; normal ears, nose and oropharynx Neck: normal jugular venous pulsations and no hepatojugular reflux; brisk carotid pulses without delay and no carotid bruits Chest: clear to auscultation, no signs of consolidation by percussion or palpation, normal fremitus, symmetrical and  full respiratory excursions Cardiovascular: normal position and quality of the apical impulse, regular rhythm, normal first and second heart sounds, early audible systolic ejection murmur, no diastolic murmurs, rubs or gallops Abdomen: no tenderness or distention, no masses by palpation, no abnormal pulsatility or arterial bruits, normal bowel sounds, no hepatosplenomegaly Extremities: no clubbing, cyanosis or edema; 2+ radial, ulnar and brachial pulses bilaterally; 2+ right femoral, posterior tibial and dorsalis pedis pulses; 2+ left femoral, posterior tibial and dorsalis pedis pulses; no subclavian or femoral bruits Neurological: grossly nonfocal Psych: Normal mood and affect     Wt Readings from Last 3 Encounters:  10/12/24 196 lb (88.9 kg)   02/05/24 193 lb 12.8 oz (87.9 kg)  12/26/23 191 lb 8 oz (86.9 kg)      Studies/Labs Reviewed:    EKG Interpretation Date/Time:  Monday October 12 2024 13:27:27 EST Ventricular Rate:  87 PR Interval:  168 QRS Duration:  82 QT Interval:  352 QTC Calculation: 423 R Axis:   9  Text Interpretation: Normal sinus rhythm Nonspecific ST abnormality When compared with ECG of 05-Feb-2024 14:29, No significant change was found Confirmed by Osmin Welz ([id number]) on 10/12/2024 1:42:45 PM        Echocardiogram 09/09/2023:    1. Left ventricular ejection fraction, by estimation, is 60 to 65%. The  left ventricle has normal function. The left ventricle has no regional  wall motion abnormalities. Left ventricular diastolic parameters are  consistent with Grade I diastolic  dysfunction (impaired relaxation).   2. Right ventricular systolic function is normal. The right ventricular  size is normal. Tricuspid regurgitation signal is inadequate for assessing  PA pressure.   3. The mitral valve is normal in structure. Mild mitral valve  regurgitation. No evidence of mitral stenosis.   4. Bioprosthetic aortic valve s/p TAVR wtih 26 mm Edwards Sapien 3 THV.  Trivial peri-valvular leakage. Mean gradient 8 mmHg, DI 0.53.   5. The inferior vena cava is normal in size with greater than 50%  respiratory variability, suggesting right atrial pressure of 3 mmHg.   6. 6 x 4.7 cm cystic structure noted in liver.    Cardiac catheterization 06/20/2022 :    Prox RCA to Mid RCA lesion is 10% stenosed.   Prox Cx to Mid Cx lesion is 10% stenosed.   Ost LAD to Mid LAD lesion is 10% stenosed.   Mild non-obstructive CAD Moderately severe to severe aortic stenosis. Peak to peak gradient 37.3 mmHg, mean gradient 36.7 mmHg.  Normal right and left heart pressures.  Normal cardiac output   Recommendations: Will review with Dr. Francyne. Given the fact that she is symptomatic and has AS that is bordering on  severe, will decide between Progress trial and proceeding with TAVR.   Fick Cardiac Output 7.97 L/min  Fick Cardiac Output Index 3.87 (L/min)/BSA  Aortic Mean Gradient 36.65 mmHg  Aortic Peak Gradient 37.3 mmHg  Aortic Valve Area 1.21  Aortic Value Area Index 0.59 cm2/BSA  RA A Wave 8 mmHg  RA V Wave 5 mmHg  RA Mean 2 mmHg  RV Systolic Pressure 37 mmHg  RV Diastolic Pressure 1 mmHg  RV EDP 8 mmHg  PA Systolic Pressure 37 mmHg  PA Diastolic Pressure 10 mmHg  PA Mean 19 mmHg  PW A Wave 10 mmHg  PW V Wave 11 mmHg  PW Mean 10 mmHg  AO Systolic Pressure 134 mmHg  AO Diastolic Pressure 60 mmHg  AO Mean 92 mmHg  LV Systolic Pressure 194 mmHg  LV Diastolic Pressure 3 mmHg  LV EDP 17 mmHg  AOp Systolic Pressure 147 mmHg  AOp Diastolic Pressure 61 mmHg  AOp Mean Pressure 98 mmHg  LVp Systolic Pressure 172 mmHg  LVp Diastolic Pressure 14 mmHg  LVp EDP Pressure 15 mmHg  QP/QS 1  TPVR Index 4.91 HRUI  TSVR Index 23.77 HRUI  PVR SVR Ratio 0.1  TPVR/TSVR Ratio 0.21   Recent Labs: 04/15/2019 Total cholesterol 161, HDL 60, LDL 90, triglycerides 57 Hemoglobin 12.5, creatinine 1.0, normal TSH  04/26/2021 Cholesterol 173, HDL 75, LDL 81, triglycerides 87  93/78/7976 Cholesterol 172, HDL 65, LDL 88, triglycerides 94 Potassium 4.5, ALT 12  05/27/2023 Cholesterol 191, HDL 58, LDL 117, triglycerides 82 Potassium 4.6, creatinine 1.04, ALT 8, TSH 12.23  06/01/2024 Cholesterol 160, HDL 70, LDL 74, triglycerides 79 Potassium 4.4, creatinine 1.04, ALT 90, TSH 2.33   ASSESSMENT:    1. S/P TAVR (transcatheter aortic valve replacement)   2. Vasovagal syncope   3. History of atrial fibrillation   4. Essential hypertension   5. Hypercholesterolemia     PLAN:  In order of problems listed above:  AS s/p TAVR: Excellent hemodynamic parameters across her prosthetic valve on the echocardiogram 14 months ago.  Aware of the need for endocarditis prophylaxis.  Will get an updated  echocardiogram. Syncope: This is her second episode of near syncope/syncope and just like the one that occurred 5 years ago it sounds vasovagal.  This time the trigger seems to be back pain. History of AFib: Has not had palpitations in years and no arrhythmia none has been detected in quite some time.  She has 2 clear episodes of atrial fibrillation that occurred more than 10 years apart, each time in the setting of acute illness (cholecystectomy and pyelonephritis, respectively).  She is asymptomatic between these events and previous arrhythmia monitors have not shown any recurrence of atrial fibrillation.  Both atria are normal in size and now that her aortic stenosis is corrected, she does not have any significant structural heart disease.  CHA2DS2-VASc score is 23 (age 50, gender, hypertension), but there is no history of stroke or TIA.  The likelihood of arrhythmia recurrence is felt to be relatively low.  The risk of bleeding on anticoagulants is not trivial in view of her age.  At least for now she will remain off anticoagulation.  As before, we asked her to call us  if she has sustained palpitations or any focal neurological complaints, even if temporary and brief. HTN: Excellent control.  In fact her diastolic blood pressure is rather low today, which could predispose to dizziness and falls. HLP: Excellent lipid parameters.  Continue atorvastatin .  Nevertheless, weight loss is encouraged.     Medication Adjustments/Labs and Tests Ordered: Current medicines are reviewed at length with the patient today.  Concerns regarding medicines are outlined above.  Medication changes, Labs and Tests ordered today are listed in the Patient Instructions below. Patient Instructions  Medication Instructions:  No changes *If you need a refill on your cardiac medications before your next appointment, please call your pharmacy*  Lab Work: None ordered If you have labs (blood work) drawn today and your tests are  completely normal, you will receive your results only by: MyChart Message (if you have MyChart) OR A paper copy in the mail If you have any lab test that is abnormal or we need to change your treatment, we will call you to review the results.  Testing/Procedures: Your physician has requested that you  have an echocardiogram. Echocardiography is a painless test that uses sound waves to create images of your heart. It provides your doctor with information about the size and shape of your heart and how well your heart's chambers and valves are working. This procedure takes approximately one hour. There are no restrictions for this procedure. Please do NOT wear cologne, perfume, aftershave, or lotions (deodorant is allowed). Please arrive 15 minutes prior to your appointment time.  Please note: We ask at that you not bring children with you during ultrasound (echo/ vascular) testing. Due to room size and safety concerns, children are not allowed in the ultrasound rooms during exams. Our front office staff cannot provide observation of children in our lobby area while testing is being conducted. An adult accompanying a patient to their appointment will only be allowed in the ultrasound room at the discretion of the ultrasound technician under special circumstances. We apologize for any inconvenience.   Follow-Up: At [facility], you and your health needs are our priority.  As part of our continuing mission to provide you with exceptional heart care, our providers are all part of one team.  This team includes your primary Cardiologist (physician) and Advanced Practice Providers or APPs (Physician Assistants and Nurse Practitioners) who all work together to provide you with the care you need, when you need it.  Your next appointment:   1 year(s)  Provider:   Jerel Balding, MD    We recommend signing up for the patient portal called MyChart.  Sign up information is provided on this After  Visit Summary.  MyChart is used to connect with patients for Virtual Visits (Telemedicine).  Patients are able to view lab/test results, encounter notes, upcoming appointments, etc.  Non-urgent messages can be sent to your provider as well.   To learn more about what you can do with MyChart, go to [email or web address].      Signed, Jerel Balding, MD  10/14/2024 3:42 PM    [facility] [address] [place], [place], [place]  [address] Phone: [phone]; Fax: [phone]

## 2024-10-21 ENCOUNTER — Ambulatory Visit: Admitting: Cardiovascular Disease

## 2024-11-23 ENCOUNTER — Ambulatory Visit: Payer: Self-pay | Admitting: Cardiovascular Disease

## 2024-11-23 ENCOUNTER — Ambulatory Visit (HOSPITAL_COMMUNITY)
Admission: RE | Admit: 2024-11-23 | Discharge: 2024-11-23 | Disposition: A | Discharge status: Home / Self Care | Source: Ambulatory Visit | Attending: Internal Medicine | Admitting: Internal Medicine

## 2024-11-23 DIAGNOSIS — Z952 Presence of prosthetic heart valve: Secondary | ICD-10-CM | POA: Insufficient documentation

## 2024-11-23 LAB — ECHOCARDIOGRAM COMPLETE
AR max vel: 1.58 cm2
AV Area VTI: 1.61 cm2
AV Area mean vel: 1.55 cm2
AV Mean grad: 7.3 mmHg
AV Peak grad: 13.1 mmHg
Ao pk vel: 1.81 m/s
Area-P 1/2: 2.83 cm2
S' Lateral: 2.7 cm
